# Patient Record
Sex: Male | Born: 1960 | Race: White | Hispanic: No | Smoking: Never smoker
Health system: Southern US, Community
[De-identification: ages and names within clinical notes are randomized; demographics above are authoritative.]

## PROBLEM LIST (undated history)

## (undated) DIAGNOSIS — E039 Hypothyroidism, unspecified: Secondary | ICD-10-CM

## (undated) DIAGNOSIS — E291 Testicular hypofunction: Secondary | ICD-10-CM

## (undated) DIAGNOSIS — M67863 Other specified disorders of tendon, right knee: Secondary | ICD-10-CM

## (undated) DIAGNOSIS — Z8639 Personal history of other endocrine, nutritional and metabolic disease: Secondary | ICD-10-CM

## (undated) DIAGNOSIS — E669 Obesity, unspecified: Secondary | ICD-10-CM

## (undated) DIAGNOSIS — Z9189 Other specified personal risk factors, not elsewhere classified: Secondary | ICD-10-CM

## (undated) DIAGNOSIS — I480 Paroxysmal atrial fibrillation: Secondary | ICD-10-CM

## (undated) DIAGNOSIS — R0602 Shortness of breath: Secondary | ICD-10-CM

## (undated) DIAGNOSIS — Z973 Presence of spectacles and contact lenses: Secondary | ICD-10-CM

## (undated) DIAGNOSIS — E785 Hyperlipidemia, unspecified: Secondary | ICD-10-CM

## (undated) DIAGNOSIS — M549 Dorsalgia, unspecified: Secondary | ICD-10-CM

## (undated) DIAGNOSIS — G4733 Obstructive sleep apnea (adult) (pediatric): Secondary | ICD-10-CM

## (undated) DIAGNOSIS — I10 Essential (primary) hypertension: Secondary | ICD-10-CM

## (undated) DIAGNOSIS — E559 Vitamin D deficiency, unspecified: Secondary | ICD-10-CM

## (undated) HISTORY — DX: Paroxysmal atrial fibrillation: I48.0

## (undated) HISTORY — DX: Vitamin D deficiency, unspecified: E55.9

## (undated) HISTORY — PX: EYE SURGERY: SHX253

## (undated) HISTORY — DX: Shortness of breath: R06.02

## (undated) HISTORY — PX: HYPOSPADIAS CORRECTION: SHX483

## (undated) HISTORY — DX: Dorsalgia, unspecified: M54.9

## (undated) HISTORY — DX: Obstructive sleep apnea (adult) (pediatric): G47.33

## (undated) HISTORY — DX: Hyperlipidemia, unspecified: E78.5

## (undated) HISTORY — DX: Essential (primary) hypertension: I10

## (undated) HISTORY — DX: Obesity, unspecified: E66.9

---

## 2000-04-08 ENCOUNTER — Emergency Department (HOSPITAL_COMMUNITY): Admission: EM | Admit: 2000-04-08 | Discharge: 2000-04-08 | Payer: Self-pay | Admitting: Emergency Medicine

## 2000-04-10 ENCOUNTER — Emergency Department (HOSPITAL_COMMUNITY): Admission: EM | Admit: 2000-04-10 | Discharge: 2000-04-10 | Payer: Self-pay | Admitting: Emergency Medicine

## 2000-04-24 ENCOUNTER — Emergency Department (HOSPITAL_COMMUNITY): Admission: EM | Admit: 2000-04-24 | Discharge: 2000-04-24 | Payer: Self-pay | Admitting: Internal Medicine

## 2004-08-04 ENCOUNTER — Ambulatory Visit: Payer: Self-pay | Admitting: Internal Medicine

## 2004-09-12 HISTORY — PX: COLONOSCOPY: SHX174

## 2004-09-15 ENCOUNTER — Ambulatory Visit: Payer: Self-pay | Admitting: Internal Medicine

## 2004-10-04 ENCOUNTER — Ambulatory Visit: Payer: Self-pay | Admitting: Internal Medicine

## 2004-10-18 ENCOUNTER — Ambulatory Visit: Payer: Self-pay | Admitting: Endocrinology

## 2004-10-22 ENCOUNTER — Ambulatory Visit (HOSPITAL_COMMUNITY): Admission: RE | Admit: 2004-10-22 | Discharge: 2004-10-22 | Payer: Self-pay | Admitting: Endocrinology

## 2004-11-30 ENCOUNTER — Encounter: Admission: RE | Admit: 2004-11-30 | Discharge: 2004-11-30 | Payer: Self-pay | Admitting: Endocrinology

## 2004-11-30 ENCOUNTER — Encounter (INDEPENDENT_AMBULATORY_CARE_PROVIDER_SITE_OTHER): Payer: Self-pay | Admitting: *Deleted

## 2004-11-30 ENCOUNTER — Other Ambulatory Visit: Admission: RE | Admit: 2004-11-30 | Discharge: 2004-11-30 | Payer: Self-pay | Admitting: Interventional Radiology

## 2004-12-13 ENCOUNTER — Ambulatory Visit: Payer: Self-pay | Admitting: Endocrinology

## 2005-01-07 ENCOUNTER — Ambulatory Visit: Payer: Self-pay | Admitting: Endocrinology

## 2005-02-02 ENCOUNTER — Ambulatory Visit (HOSPITAL_COMMUNITY): Admission: RE | Admit: 2005-02-02 | Discharge: 2005-02-03 | Payer: Self-pay | Admitting: Otolaryngology

## 2005-02-02 ENCOUNTER — Encounter (INDEPENDENT_AMBULATORY_CARE_PROVIDER_SITE_OTHER): Payer: Self-pay | Admitting: Specialist

## 2005-02-03 HISTORY — PX: THYROID LOBECTOMY: SHX420

## 2005-04-28 ENCOUNTER — Ambulatory Visit: Payer: Self-pay | Admitting: Internal Medicine

## 2005-05-05 ENCOUNTER — Ambulatory Visit: Payer: Self-pay | Admitting: Internal Medicine

## 2005-06-06 ENCOUNTER — Ambulatory Visit: Payer: Self-pay | Admitting: Internal Medicine

## 2005-06-13 ENCOUNTER — Ambulatory Visit: Payer: Self-pay | Admitting: Internal Medicine

## 2005-06-20 ENCOUNTER — Ambulatory Visit: Payer: Self-pay | Admitting: Internal Medicine

## 2005-08-10 ENCOUNTER — Ambulatory Visit: Payer: Self-pay | Admitting: Internal Medicine

## 2005-10-03 ENCOUNTER — Ambulatory Visit: Payer: Self-pay | Admitting: Internal Medicine

## 2006-02-07 ENCOUNTER — Ambulatory Visit: Payer: Self-pay | Admitting: Internal Medicine

## 2006-05-08 ENCOUNTER — Ambulatory Visit: Payer: Self-pay | Admitting: Internal Medicine

## 2006-11-06 ENCOUNTER — Ambulatory Visit: Payer: Self-pay | Admitting: Internal Medicine

## 2006-11-06 LAB — CONVERTED CEMR LAB
BUN: 12 mg/dL (ref 6–23)
CO2: 31 meq/L (ref 19–32)
Calcium: 9.4 mg/dL (ref 8.4–10.5)
Creatinine, Ser: 1.1 mg/dL (ref 0.4–1.5)
GFR calc Af Amer: 93 mL/min
Glucose, Bld: 99 mg/dL (ref 70–99)
Potassium: 4 meq/L (ref 3.5–5.1)

## 2007-05-29 ENCOUNTER — Ambulatory Visit: Payer: Self-pay | Admitting: Internal Medicine

## 2007-06-11 ENCOUNTER — Ambulatory Visit: Payer: Self-pay | Admitting: Internal Medicine

## 2007-06-11 LAB — CONVERTED CEMR LAB
ALT: 29 units/L (ref 0–53)
AST: 19 units/L (ref 0–37)
Albumin: 4 g/dL (ref 3.5–5.2)
Alkaline Phosphatase: 38 units/L — ABNORMAL LOW (ref 39–117)
BUN: 15 mg/dL (ref 6–23)
Basophils Absolute: 0.1 10*3/uL (ref 0.0–0.1)
Basophils Relative: 1 % (ref 0.0–1.0)
Bilirubin Urine: NEGATIVE
Bilirubin, Direct: 0.1 mg/dL (ref 0.0–0.3)
CO2: 29 meq/L (ref 19–32)
Calcium: 9.2 mg/dL (ref 8.4–10.5)
Chloride: 104 meq/L (ref 96–112)
Cholesterol: 156 mg/dL (ref 0–200)
Creatinine, Ser: 1.1 mg/dL (ref 0.4–1.5)
Crystals: NEGATIVE
Eosinophils Absolute: 0.2 10*3/uL (ref 0.0–0.6)
Eosinophils Relative: 4 % (ref 0.0–5.0)
GFR calc Af Amer: 92 mL/min
GFR calc non Af Amer: 76 mL/min
Glucose, Bld: 112 mg/dL — ABNORMAL HIGH (ref 70–99)
HCT: 43.8 % (ref 39.0–52.0)
HDL: 30.2 mg/dL — ABNORMAL LOW (ref >39.0)
Hemoglobin, Urine: NEGATIVE
Hemoglobin: 15 g/dL (ref 13.0–17.0)
Ketones, ur: NEGATIVE mg/dL
LDL Cholesterol: 107 mg/dL — ABNORMAL HIGH (ref 0–99)
Leukocytes, UA: NEGATIVE
Lymphocytes Relative: 26.9 % (ref 12.0–46.0)
MCHC: 34.2 g/dL (ref 30.0–36.0)
MCV: 90.2 fL (ref 78.0–100.0)
Monocytes Absolute: 0.5 10*3/uL (ref 0.2–0.7)
Monocytes Relative: 9 % (ref 3.0–11.0)
Mucus, UA: NEGATIVE
Neutro Abs: 3.7 10*3/uL (ref 1.4–7.7)
Neutrophils Relative %: 59.1 % (ref 43.0–77.0)
Nitrite: NEGATIVE
PSA: 0.45 ng/mL (ref 0.10–4.00)
Platelets: 258 10*3/uL (ref 150–400)
Potassium: 4.3 meq/L (ref 3.5–5.1)
RBC / HPF: NONE SEEN
RBC: 4.86 M/uL (ref 4.22–5.81)
RDW: 12.1 % (ref 11.5–14.6)
Sodium: 140 meq/L (ref 135–145)
Specific Gravity, Urine: 1.01 (ref 1.000–1.03)
TSH: 3.96 microintl units/mL (ref 0.35–5.50)
Total Bilirubin: 0.7 mg/dL (ref 0.3–1.2)
Total CHOL/HDL Ratio: 5.2
Total Protein, Urine: NEGATIVE mg/dL
Total Protein: 6.7 g/dL (ref 6.0–8.3)
Triglycerides: 92 mg/dL (ref 0–149)
Urine Glucose: NEGATIVE mg/dL
Urobilinogen, UA: 0.2 (ref 0.0–1.0)
VLDL: 18 mg/dL (ref 0–40)
WBC: 6.1 10*3/uL (ref 4.5–10.5)
pH: 6 (ref 5.0–8.0)

## 2007-06-22 ENCOUNTER — Ambulatory Visit: Payer: Self-pay | Admitting: Internal Medicine

## 2007-06-24 ENCOUNTER — Encounter: Payer: Self-pay | Admitting: Internal Medicine

## 2007-06-24 DIAGNOSIS — E785 Hyperlipidemia, unspecified: Secondary | ICD-10-CM | POA: Insufficient documentation

## 2007-06-24 DIAGNOSIS — I10 Essential (primary) hypertension: Secondary | ICD-10-CM

## 2007-06-24 DIAGNOSIS — E039 Hypothyroidism, unspecified: Secondary | ICD-10-CM

## 2007-10-10 ENCOUNTER — Encounter (INDEPENDENT_AMBULATORY_CARE_PROVIDER_SITE_OTHER): Payer: Self-pay | Admitting: *Deleted

## 2007-10-24 ENCOUNTER — Encounter (INDEPENDENT_AMBULATORY_CARE_PROVIDER_SITE_OTHER): Payer: Self-pay | Admitting: *Deleted

## 2007-12-24 ENCOUNTER — Telehealth: Payer: Self-pay | Admitting: Internal Medicine

## 2008-01-21 ENCOUNTER — Ambulatory Visit: Payer: Self-pay | Admitting: Internal Medicine

## 2008-01-22 LAB — CONVERTED CEMR LAB
BUN: 16 mg/dL (ref 6–23)
CO2: 31 meq/L (ref 19–32)
Chloride: 105 meq/L (ref 96–112)
GFR calc non Af Amer: 69 mL/min
Potassium: 3.7 meq/L (ref 3.5–5.1)
TSH: 2.91 microintl units/mL (ref 0.35–5.50)

## 2009-02-05 ENCOUNTER — Encounter: Payer: Self-pay | Admitting: Internal Medicine

## 2009-04-27 ENCOUNTER — Ambulatory Visit: Payer: Self-pay | Admitting: Internal Medicine

## 2009-04-28 ENCOUNTER — Ambulatory Visit: Payer: Self-pay | Admitting: Internal Medicine

## 2009-04-29 DIAGNOSIS — Z860100 Personal history of colon polyps, unspecified: Secondary | ICD-10-CM | POA: Insufficient documentation

## 2009-05-04 LAB — CONVERTED CEMR LAB
Albumin: 4 g/dL (ref 3.5–5.2)
CO2: 31 meq/L (ref 19–32)
Chloride: 105 meq/L (ref 96–112)
Cholesterol: 146 mg/dL (ref 0–200)
LDL Cholesterol: 90 mg/dL (ref 0–99)
Potassium: 3.8 meq/L (ref 3.5–5.1)
TSH: 4.12 microintl units/mL (ref 0.35–5.50)
Testosterone: 196.69 ng/dL — ABNORMAL LOW (ref 350.00–890.00)
Total Protein: 6.8 g/dL (ref 6.0–8.3)
Triglycerides: 126 mg/dL (ref 0.0–149.0)
VLDL: 25.2 mg/dL (ref 0.0–40.0)

## 2009-07-29 ENCOUNTER — Encounter (INDEPENDENT_AMBULATORY_CARE_PROVIDER_SITE_OTHER): Payer: Self-pay | Admitting: *Deleted

## 2009-07-31 ENCOUNTER — Telehealth: Payer: Self-pay | Admitting: Internal Medicine

## 2009-08-24 ENCOUNTER — Ambulatory Visit: Payer: Self-pay | Admitting: Internal Medicine

## 2009-08-24 DIAGNOSIS — E291 Testicular hypofunction: Secondary | ICD-10-CM

## 2009-08-24 DIAGNOSIS — N529 Male erectile dysfunction, unspecified: Secondary | ICD-10-CM

## 2009-09-10 ENCOUNTER — Ambulatory Visit: Payer: Self-pay | Admitting: Internal Medicine

## 2009-09-14 LAB — CONVERTED CEMR LAB
ALT: 42 units/L (ref 0–53)
AST: 26 units/L (ref 0–37)
Calcium: 9.2 mg/dL (ref 8.4–10.5)
Cholesterol: 153 mg/dL (ref 0–200)
GFR calc non Af Amer: 75.44 mL/min (ref >60)
Glucose, Bld: 106 mg/dL — ABNORMAL HIGH (ref 70–99)
HDL: 31.1 mg/dL — ABNORMAL LOW (ref >39.00)
Sodium: 140 meq/L (ref 135–145)
TSH: 4.46 microintl units/mL (ref 0.35–5.50)
Total Bilirubin: 0.8 mg/dL (ref 0.3–1.2)
Triglycerides: 125 mg/dL (ref 0.0–149.0)

## 2009-09-21 ENCOUNTER — Encounter (INDEPENDENT_AMBULATORY_CARE_PROVIDER_SITE_OTHER): Payer: Self-pay | Admitting: *Deleted

## 2009-10-05 ENCOUNTER — Telehealth: Payer: Self-pay | Admitting: Internal Medicine

## 2009-11-02 ENCOUNTER — Encounter: Payer: Self-pay | Admitting: Internal Medicine

## 2009-11-09 ENCOUNTER — Ambulatory Visit: Payer: Self-pay | Admitting: Internal Medicine

## 2009-11-16 ENCOUNTER — Telehealth: Payer: Self-pay | Admitting: Internal Medicine

## 2009-11-20 ENCOUNTER — Telehealth: Payer: Self-pay | Admitting: Internal Medicine

## 2009-11-23 ENCOUNTER — Telehealth: Payer: Self-pay | Admitting: Internal Medicine

## 2009-11-23 ENCOUNTER — Encounter: Payer: Self-pay | Admitting: Internal Medicine

## 2010-01-06 ENCOUNTER — Telehealth: Payer: Self-pay | Admitting: Internal Medicine

## 2010-10-12 NOTE — Medication Information (Signed)
Summary: Approved/[facility]  Approved/[facility]   Imported By: Lester Phelan 11/25/2009 09:39:34  _____________________________________________________________________  External Attachment:    Type:   Image     Comment:   External Document

## 2010-10-12 NOTE — Progress Notes (Signed)
Summary: medications  Phone Note Call from Patient Call back at Home Phone [phone]   Summary of Call: Patient left message on triage that he has been laid off and will be losing his health insurance. Patient would like to change to generics/alternatives of all of his meds. Please advise. Initial call taken by: Lucious Groves,  January 06, 2010 3:49 PM  Follow-up for Phone Call        Amlod/Benaz in place of Azor Does he want to switch to generic injectible testosterone? Other meds are generic Im sorry about lay off Follow-up by: Tresa Garter MD,  January 07, 2010 7:49 AM  Additional Follow-up for Phone Call Additional follow up Details #1::        left mess to call office back., need to suggest pt look into pt assistance for testosterone (either androgel or injectable) Additional Follow-up by: Lamar Sprinkles, CMA,  January 07, 2010 3:51 PM    Additional Follow-up for Phone Call Additional follow up Details #2::    pt informed via VM that BP meds changed to generic and sent to pharmacy, other meds generic. I suggested pt look into pt asst for injectable medication through manufacture. Follow-up by: Margaret Pyle, CMA,  January 08, 2010 3:45 PM  New/Updated Medications: AMLODIPINE BESY-BENAZEPRIL HCL 5-20 MG CAPS (AMLODIPINE BESY-BENAZEPRIL HCL) 1 by mouth once daily for blood pressure Prescriptions: AMLODIPINE BESY-BENAZEPRIL HCL 5-20 MG CAPS (AMLODIPINE BESY-BENAZEPRIL HCL) 1 by mouth once daily for blood pressure  #90 x 3   Entered by:   Lamar Sprinkles, CMA   Authorized by:   Tresa Garter MD   Signed by:   Lamar Sprinkles, CMA on 01/07/2010   Method used:   [facility]        [facility] [phone][facility] (retail)       [address]       [place], [place]  [address]       Ph: [phone]       Fax: [phone]   RxID:   [id number]

## 2010-10-12 NOTE — Progress Notes (Signed)
  Phone Note Refill Request Message from:  Fax from Pharmacy  Refills Requested: Medication #1:  ANDROGEL PUMP 1 % GEL 10 g on skin qam. Initial call taken by: Ami Bullins CMA,  November 20, 2009 9:31 AM  Follow-up for Phone Call        prescription printed out and given to Dr Jonny Ruiz to sign in absence of Dr Macario Golds. Dr Jonny Ruiz signed and prescription was put up front in metal cabinet for pt to pick up. Follow-up by: Ami Bullins CMA,  November 20, 2009 9:39 AM    Prescriptions: ANDROGEL PUMP 1 % GEL (TESTOSTERONE) 10 g on skin qam  #1 x 6   Entered by:   Ami Bullins CMA   Authorized by:   Tresa Garter MD   Signed by:   Bill Salinas CMA on 11/20/2009   Method used:   Print then Give to Patient   RxID:   [id number]

## 2010-10-12 NOTE — Assessment & Plan Note (Signed)
Summary: 4 MO ROV / #  Randy [name]   Vital Signs:  Patient profile:   50 year old male Weight:      342 pounds Temp:     98.7 degrees F oral Pulse rate:   81 / minute BP sitting:   132 / 86  (left arm)  Vitals Entered By: Tora Perches (November 09, 2009 1:50 PM) CC: f/u Is Patient Diabetic? No   CC:  f/u.  History of Present Illness: The patient presents for a follow up of hypertension, hypothyroidism, hyperlipidemia   Preventive Screening-Counseling & Management  Alcohol-Tobacco     Smoking Status: never  Current Medications (verified): 1)  Lovastatin 40 Mg Tabs (Lovastatin) .... Qd 2)  Levoxyl 200 Mcg  Tabs (Levothyroxine Sodium) .... Qd 3)  Aspir-Low 81 Mg Tbec (Aspirin) .Marland Kitchen.. 1 Once Daily Pc 4)  Vitamin D3 1000 Unit  Tabs (Cholecalciferol) .Marland Kitchen.. 1 By Mouth Daily 5)  Azor 5-20 Mg  Tabs (Amlodipine-Olmesartan) .... Take 1 Tab By Mouth Every Day 6)  Triamterene-Hctz 37.5-25 Mg  Tabs (Triamterene-Hctz) .... One By Mouth Daily  Allergies (verified): No Known Drug Allergies  Past History:  Past Medical History: Last updated: 04/27/2009 Hyperlipidemia Hypertension Hypothyroidism Obesity Colonic polyps, hx of 2006 Dr Leone Payor  Review of Systems  The patient denies weight loss, weight gain, chest pain, dyspnea on exertion, and abdominal pain.    Physical Exam  General:  overweight-appearing.   Nose:  External nasal examination shows no deformity or inflammation. Nasal mucosa are pink and moist without lesions or exudates. Mouth:  Oral mucosa and oropharynx without lesions or exudates.  Teeth in good repair. Lungs:  Normal respiratory effort, chest expands symmetrically. Lungs are clear to auscultation, no crackles or wheezes. Heart:  Normal rate and regular rhythm. S1 and S2 normal without gallop, murmur, click, rub or other extra sounds. Abdomen:  Bowel sounds positive,abdomen soft and non-tender without masses, organomegaly or hernias noted. Msk:  No deformity or  scoliosis noted of thoracic or lumbar spine.   Neurologic:  No cranial nerve deficits noted. Station and gait are normal. Plantar reflexes are down-going bilaterally. DTRs are symmetrical throughout. Sensory, motor and coordinative functions appear intact. Skin:  Intact without suspicious lesions or rashes Psych:  Cognition and judgment appear intact. Alert and cooperative with normal attention span and concentration. No apparent delusions, illusions, hallucinations   Impression & Recommendations:  Problem # 1:  HYPOTHYROIDISM (ICD-244.9) Assessment Comment Only  The following medications were removed from the medication list:    Levoxyl 200 Mcg Tabs (Levothyroxine sodium) ..... Qd His updated medication list for this problem includes:    Levoxyl 112 Mcg Tabs (Levothyroxine sodium) .Marland Kitchen... 2 by mouth qd  Labs Reviewed: TSH: 4.46 (09/10/2009)    HgBA1c: 5.6 (09/10/2009) Chol: 153 (09/10/2009)   HDL: 31.10 (09/10/2009)   LDL: 97 (09/10/2009)   TG: 125.0 (09/10/2009)  Problem # 2:  OBESITY, MORBID (ICD-278.01) Assessment: Unchanged Discussed options again including lap band  Problem # 3:  ERECTILE DYSFUNCTION, ORGANIC (ICD-607.84) Assessment: Unchanged  Problem # 4:  HYPERLIPIDEMIA (ICD-272.4) Assessment: Comment Only  His updated medication list for this problem includes:    Lovastatin 40 Mg Tabs (Lovastatin) ..... Qd  Labs Reviewed: SGOT: 26 (09/10/2009)   SGPT: 42 (09/10/2009)   HDL:31.10 (09/10/2009), 31.00 (04/28/2009)  LDL:97 (09/10/2009), 90 (04/28/2009)  Chol:153 (09/10/2009), 146 (04/28/2009)  Trig:125.0 (09/10/2009), 126.0 (04/28/2009)  Problem # 5:  HYPOGONADISM (ICD-257.2) Assessment: New  Treatment options discussed - elected injections. Risks vs benefits and  controversies of a long term testosterone  use were discussed. IM inj demo given  Orders: Admin of Therapeutic Inj (IM or [place]) ([id number])  Complete Medication List: 1)  Lovastatin 40 Mg Tabs (Lovastatin) ....  Qd 2)  Aspir-low 81 Mg Tbec (Aspirin) .Marland Kitchen.. 1 once daily pc 3)  Vitamin D3 1000 Unit Tabs (Cholecalciferol) .Marland Kitchen.. 1 by mouth daily 4)  Azor 5-20 Mg Tabs (Amlodipine-olmesartan) .... Take 1 tab by mouth every day 5)  Triamterene-hctz 37.5-25 Mg Tabs (Triamterene-hctz) .... One by mouth daily 6)  Levoxyl 112 Mcg Tabs (Levothyroxine sodium) .... 2 by mouth qd 7)  Testosterone Cypionate 200 Mg/ml Oil (Testosterone cypionate) .... 2 ml im q 2 wks 8)  3ml Syr 1" 23g Needle  .... [place] as dirr im  Patient Instructions: 1)  Please schedule a follow-up appointment in 3 months. 2)  BMP prior to visit, ICD-9: 3)  Hepatic Panel prior to visit, ICD-9: 4)  Lipid Panel prior to visit, ICD-9: 5)  TSH prior to visit, ICD-9: 6)  CBC w/ Diff prior to visit, ICD-9: 7)  HbgA1C prior to visit, ICD-9: 995.20  272.20 Prescriptions: SYR 1" 23G NEEDLE [place] as dirr IM  #20 x 12   Entered and Authorized by:   Tresa Garter MD   Signed by:   Tresa Garter MD on 11/09/2009   Method used:   Print then Give to Patient   RxID:   [id number] SYRINGE 27G X 1-1/4" 3 ML MISC (SYRINGE/NEEDLE (DISP)) as dirr IM  #20 x 6   Entered and Authorized by:   Tresa Garter MD   Signed by:   Tresa Garter MD on 11/09/2009   Method used:   Print then Give to Patient   RxID:   [id number] TESTOSTERONE CYPIONATE 200 MG/ML OIL (TESTOSTERONE CYPIONATE) 2 ml IM q 2 wks  #20 ml x 6   Entered and Authorized by:   Tresa Garter MD   Signed by:   Tresa Garter MD on 11/09/2009   Method used:   Print then Give to Patient   RxID:   [id number] AZOR 5-20 MG  TABS (AMLODIPINE-OLMESARTAN) Take 1 tab by mouth every day  #30 x 12   Entered and Authorized by:   Tresa Garter MD   Signed by:   Tresa Garter MD on 11/09/2009   Method used:   [facility] to        [facility] [phone]* (retail)       [address]       [place], [place]  [address]        Ph: [phone]       Fax: [phone]   RxID:   [id number] LEVOXYL 112 MCG TABS (LEVOTHYROXINE SODIUM) 2 by mouth qd  #60 x 12   Entered and Authorized by:   Tresa Garter MD   Signed by:   Tresa Garter MD on 11/09/2009   Method used:   Electronically to        [facility] [phone]* (retail)       [address]       [place], [place]  [address]       Ph: [phone]       Fax: [phone]   RxID:   [id number]

## 2010-10-12 NOTE — Progress Notes (Signed)
Summary: REFILL  Phone Note Call from Patient Call back at [phone]   Summary of Call: Pt lost azor rx. Sent into pharm, need to call pt to inform.  Initial call taken by: Lamar Sprinkles, CMA,  October 05, 2009 9:49 AM  Follow-up for Phone Call        Pt informed  Follow-up by: Lamar Sprinkles, CMA,  October 05, 2009 10:34 AM    Prescriptions: Kaylyn Layer 5-20 MG  TABS (AMLODIPINE-OLMESARTAN) Take 1 tab by mouth every day  #90 x 3   Entered by:   Lamar Sprinkles, CMA   Authorized by:   Tresa Garter MD   Signed by:   Lamar Sprinkles, CMA on 10/05/2009   Method used:   Electronically to        [facility] [phone]* (retail)       [address]       [place], [place]  [address]       Ph: [phone]       Fax: [phone]   RxID:   [id number]

## 2010-10-12 NOTE — Letter (Signed)
Summary: [facility] Referral (unable to schedule) Notification  [facility]  [address] [place], [place] [address]   Phone: [phone]  Fax: [phone]      September 21, 2009 JEWELL RYANS 1960-10-21 MRN: [id number]   [facility] [address] [place], [place]  [address]   Dear Dr. Posey Rea:   Thank you for your kind referral of the above patient. We have attempted to schedule the recommended Colonoscopy but have been unable to schedule because:  _x_ The patient was not available by phone and/or has not returned our calls.  __ The patient declined to schedule the procedure at this time.  We appreciate the referral and hope that we will have the opportunity to treat this patient in the future.    Sincerely,   [facility]  Vania Rea. Jarold Motto M.D. Hedwig Morton. Juanda Chance M.D. Venita Lick. Russella Dar M.D. Wilhemina Bonito. Marina Goodell M.D. Barbette Hair. Arlyce Dice M.D. Iva Boop M.D. Cheron Every.D.

## 2010-10-12 NOTE — Progress Notes (Signed)
Summary: Androgel PA  Phone Note From Pharmacy   Caller: [facility] Summary of Call: PA request--Androgel. They will fax form. Initial call taken by: Lucious Groves,  November 23, 2009 10:20 AM  Follow-up for Phone Call        Form completed and faxed, does not need MD signature. Will wait for insurance company reply. Follow-up by: Lucious Groves,  November 23, 2009 10:34 AM     Appended Document: Androgel PA Approved until 2012.

## 2010-10-12 NOTE — Medication Information (Signed)
Summary: [facility]  [facility]   Imported By: Lester Montier 11/25/2009 07:54:25  _____________________________________________________________________  External Attachment:    Type:   Image     Comment:   External Document

## 2010-10-12 NOTE — Letter (Signed)
Summary: Health Screening/[facility]  Health Screening/[facility]   Imported By: Sherian Rein 11/06/2009 07:38:04  _____________________________________________________________________  External Attachment:    Type:   Image     Comment:   External Document

## 2010-10-12 NOTE — Progress Notes (Signed)
Summary: ANDROGEL  Phone Note Call from Patient   Summary of Call: Patient is requesting rx for androgel, insurance will cover per pt. He returned rx for testosterone injection & I shredded.  Initial call taken by: Lamar Sprinkles, CMA,  November 16, 2009 2:37 PM  Follow-up for Phone Call        OK Androgel Follow-up by: Tresa Garter MD,  November 16, 2009 6:08 PM  Additional Follow-up for Phone Call Additional follow up Details #1::        left mess to call office back............................Marland KitchenLamar Sprinkles, CMA  November 17, 2009 8:58 AM     Additional Follow-up for Phone Call Additional follow up Details #2::    attempted to call pt, no answer, no machine 2nd attempt, no answer, no machine................. Sherese Christopher  November 18, 2009 11:25 AM  Follow-up by: Sydell Axon,  November 17, 2009 3:56 PM  Additional Follow-up for Phone Call Additional follow up Details #3:: Details for Additional Follow-up Action Taken: No answer, no VM, rx was printed in Error yesterday and shredded....................................Marland KitchenLamar Sprinkles, CMA  November 18, 2009 5:36 PM   No answer/no machine. Closed phone note until patient calls again.Lucious Groves  November 19, 2009 1:24 PM   New/Updated Medications: ANDROGEL PUMP 1 % GEL (TESTOSTERONE) 10 g on skin qam Prescriptions: ANDROGEL PUMP 1 % GEL (TESTOSTERONE) 10 g on skin qam  #1 x 12   Entered and Authorized by:   Tresa Garter MD   Signed by:   Lamar Sprinkles, CMA on 11/17/2009   Method used:   Print then Give to Patient   RxID:   [id number]

## 2010-10-12 NOTE — Letter (Signed)
Summary: [facility]/Patient  [facility]/Patient   Imported By: Sherian Rein 11/04/2009 09:08:40  _____________________________________________________________________  External Attachment:    Type:   Image     Comment:   External Document

## 2010-12-05 ENCOUNTER — Other Ambulatory Visit: Payer: Self-pay | Admitting: Internal Medicine

## 2010-12-15 ENCOUNTER — Telehealth: Payer: Self-pay | Admitting: *Deleted

## 2010-12-15 NOTE — Telephone Encounter (Signed)
Patient requesting refills on:  lovastatin 40mg  1qd maxzide 37.5/25 1 qd Levothyroxine 2 tabs qd  Last OV 10/2009, Ok for refill?

## 2010-12-15 NOTE — Telephone Encounter (Signed)
OK to fill all prescription with additional refills x2 and sch OV Thank you!

## 2010-12-16 MED ORDER — LEVOTHYROXINE SODIUM 112 MCG PO TABS: 224.0000 ug | ORAL_TABLET | Freq: Every day | ORAL | Status: DC

## 2010-12-16 MED ORDER — TRIAMTERENE-HCTZ 37.5-25 MG PO TABS: 1.0000 | ORAL_TABLET | Freq: Every day | ORAL | Status: DC

## 2010-12-16 MED ORDER — LOVASTATIN 40 MG PO TABS: 40.0000 mg | ORAL_TABLET | Freq: Every day | ORAL | Status: DC

## 2010-12-16 NOTE — Telephone Encounter (Signed)
Rf's done, left vm for pt to check w/pharm

## 2011-01-11 ENCOUNTER — Other Ambulatory Visit: Payer: Self-pay | Admitting: Internal Medicine

## 2011-01-28 NOTE — Op Note (Signed)
NAME:  Randy [name], Randy [name]              ACCOUNT NO.:  [id number]   MEDICAL RECORD NO.:  [id number]          PATIENT TYPE:  OIB   LOCATION:  5727                         FACILITY:  [facility]   PHYSICIAN:  Jefry H. Pollyann Kennedy, MD     DATE OF BIRTH:  [birth year]   DATE OF PROCEDURE:  02/03/2005  DATE OF DISCHARGE:  02/03/2005                                 OPERATIVE REPORT   PREOPERATIVE DIAGNOSIS:  Right thyroid nodule.   POSTOPERATIVE DIAGNOSIS:  Right thyroid nodule.   PROCEDURE:  Right thyroid lobectomy.   SURGEON:  Jefry H. Pollyann Kennedy, MD.   ASSISTANT:  Kinnie Scales. Annalee Genta, M.D.   ANESTHESIA:  General endotracheal anesthesia was used.   COMPLICATIONS:  No complications.   BLOOD LOSS:  Minimal.   FINDINGS:  An approximately 1 to 1.5 cm mass within the right lobe of the  thyroid.  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   REFERRING PHYSICIAN:  Sean A. Everardo All, M.D.  and Georgina Quint. Plotnikov, M.D.   HISTORY:  This is a 51 year old gentleman who was found recently to have a  solitary nodule in the right thyroid lobe. 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 Risks, benefits,  alternatives, complications of procedure were explained to the patient who  seemed to understand and agreed to surgery.   PROCEDURE:  The patient was taken to the operating room, placed on the  operating table in supine position. Following induction of general  endotracheal anesthesia, a shoulder roll was placed beneath the patient's  shoulders and the neck was prepped and draped in standard fashion. A low  horizontal incision was outlined with a marking pen just above the clavicle.  Electrocautery was used to incise the skin and through the superficial  fascia as well as platysma muscle. Subplatysmal flap was developed  superiorly up to the thyroid notch and inferiorly to the clavicle. Midline  fascia was divided. The right lobe was identified and retracted  medially  while careful dissection was used to dissect the right lobe off of the  posterior aspect of the strap muscles.  Continued dissection staying very  close to the capsule of the gland was accomplished all the way around  dividing the middle thyroid vein and up into the superior pole vasculature.  All vessels were doubly ligated between clamps and divided. 4-0 silk ties  were used as well. The recurrent laryngeal nerve was identified as it passed  posterior to the midportion of the gland and entered into the cricoid. The  ligament of Allyson Sabal was divided and the very thin isthmus was divided using  electrocautery. The specimen sent for pathologic evaluation. The wound was  irrigated with saline and closed in layers using 4-0 chromic  on the midline fascia and platysma layer and running 5-0 nylon on the skin.  A #7-French round JP drain was left in the wound exiting through a left side  of the incision and secured in place with nylon suture. The patient was  awakened from anesthesia, extubated, transferred to recovery in stable  condition.      JHR/MEDQ  D:  02/03/2005  T:  02/03/2005  Job:  [id number]

## 2011-03-10 ENCOUNTER — Other Ambulatory Visit: Payer: Self-pay | Admitting: Internal Medicine

## 2011-06-22 ENCOUNTER — Other Ambulatory Visit: Payer: Self-pay | Admitting: Internal Medicine

## 2011-10-05 ENCOUNTER — Telehealth: Payer: Self-pay | Admitting: *Deleted

## 2011-10-05 NOTE — Telephone Encounter (Signed)
OK to fill this prescription x 3 mo each. Sch an OV Thank you!

## 2011-10-05 NOTE — Telephone Encounter (Signed)
Rf req for Lovastatin 40 mg 1 po ad. # 90.  AND Triam/HCTZ 37.5/25 1 po qd # 90. Last filled 12.18.12. Last OV 10/2009. No existing appt. Ok to Rf?

## 2011-10-06 MED ORDER — TRIAMTERENE-HCTZ 37.5-25 MG PO TABS: 1.0000 | ORAL_TABLET | Freq: Every day | ORAL | Status: DC

## 2011-10-06 MED ORDER — AMLODIPINE BESY-BENAZEPRIL HCL 5-20 MG PO CAPS: 1.0000 | ORAL_CAPSULE | Freq: Every day | ORAL | Status: DC

## 2011-10-06 NOTE — Telephone Encounter (Signed)
Rf sent. Will have schedulers contact pt to arrange OV.

## 2011-10-10 ENCOUNTER — Other Ambulatory Visit: Payer: Self-pay | Admitting: *Deleted

## 2011-10-10 MED ORDER — LOVASTATIN 40 MG PO TABS: 40.0000 mg | ORAL_TABLET | Freq: Every day | ORAL | Status: DC

## 2011-10-18 ENCOUNTER — Other Ambulatory Visit: Payer: Self-pay | Admitting: *Deleted

## 2011-10-18 MED ORDER — LEVOTHYROXINE SODIUM 112 MCG PO TABS: 224.0000 ug | ORAL_TABLET | Freq: Every day | ORAL | Status: DC

## 2011-12-01 ENCOUNTER — Other Ambulatory Visit: Payer: Self-pay | Admitting: Internal Medicine

## 2011-12-27 ENCOUNTER — Ambulatory Visit: Payer: Self-pay | Admitting: Internal Medicine

## 2012-01-25 ENCOUNTER — Other Ambulatory Visit: Payer: Self-pay | Admitting: Internal Medicine

## 2012-01-27 ENCOUNTER — Encounter: Payer: Self-pay | Admitting: Internal Medicine

## 2012-01-27 ENCOUNTER — Ambulatory Visit (INDEPENDENT_AMBULATORY_CARE_PROVIDER_SITE_OTHER): Payer: PRIVATE HEALTH INSURANCE | Admitting: Internal Medicine

## 2012-01-27 VITALS — BP 150/70 | HR 76 | Temp 98.6°F | Resp 16 | Wt 360.0 lb

## 2012-01-27 DIAGNOSIS — E039 Hypothyroidism, unspecified: Secondary | ICD-10-CM

## 2012-01-27 DIAGNOSIS — Z0000 Encounter for general adult medical examination without abnormal findings: Secondary | ICD-10-CM

## 2012-01-27 DIAGNOSIS — E291 Testicular hypofunction: Secondary | ICD-10-CM

## 2012-01-27 DIAGNOSIS — I10 Essential (primary) hypertension: Secondary | ICD-10-CM

## 2012-01-27 MED ORDER — LEVOTHYROXINE SODIUM 112 MCG PO TABS: 112.0000 ug | ORAL_TABLET | Freq: Every day | ORAL | Status: DC

## 2012-01-27 MED ORDER — AMLODIPINE BESY-BENAZEPRIL HCL 5-20 MG PO CAPS: 1.0000 | ORAL_CAPSULE | Freq: Every day | ORAL | Status: DC

## 2012-01-27 MED ORDER — TRIAMTERENE-HCTZ 37.5-25 MG PO TABS: 1.0000 | ORAL_TABLET | Freq: Every day | ORAL | Status: DC

## 2012-01-27 MED ORDER — LOVASTATIN 40 MG PO TABS: 40.0000 mg | ORAL_TABLET | Freq: Every day | ORAL | Status: DC

## 2012-01-27 NOTE — Assessment & Plan Note (Signed)
Continue with current prescription therapy as reflected on the Med list.  

## 2012-01-27 NOTE — Assessment & Plan Note (Addendum)
We can re-start Rx Labs

## 2012-01-27 NOTE — Patient Instructions (Signed)
[email or web address] 

## 2012-01-27 NOTE — Progress Notes (Signed)
  Subjective:    Patient ID: Randy [name], male    DOB: July 15, 1961, 51 y.o.   MRN: [id number]  HPI  The patient presents for a follow-up of  chronic hypertension, chronic hypothyroidism, hypogonadism, obesity  Wt Readings from Last 3 Encounters:  01/27/12 360 lb (163.295 kg)  11/09/09 342 lb (155.13 kg)  08/24/09 341 lb (154.677 kg)   BP Readings from Last 3 Encounters:  01/27/12 150/70  11/09/09 132/86  08/24/09 140/80       Review of Systems  Constitutional: Negative for appetite change, fatigue and unexpected weight change.  HENT: Negative for nosebleeds, congestion, sore throat, sneezing, trouble swallowing and neck pain.   Eyes: Negative for itching and visual disturbance.  Respiratory: Negative for cough.   Cardiovascular: Negative for chest pain, palpitations and leg swelling.  Gastrointestinal: Negative for nausea, diarrhea, blood in stool and abdominal distention.  Genitourinary: Negative for frequency and hematuria.  Musculoskeletal: Negative for back pain, joint swelling and gait problem.  Skin: Negative for rash.  Neurological: Negative for dizziness, tremors, speech difficulty and weakness.  Psychiatric/Behavioral: Negative for suicidal ideas, sleep disturbance, dysphoric mood and agitation. The patient is not nervous/anxious.        Objective:   Physical Exam  Constitutional: He is oriented to person, place, and time. He appears well-developed.       Obese   HENT:  Mouth/Throat: Oropharynx is clear and moist.  Eyes: Conjunctivae are normal. Pupils are equal, round, and reactive to light.  Neck: Normal range of motion. No JVD present. No thyromegaly present.  Cardiovascular: Normal rate, regular rhythm, normal heart sounds and intact distal pulses.  Exam reveals no gallop and no friction rub.   No murmur heard. Pulmonary/Chest: Effort normal and breath sounds normal. No respiratory distress. He has no wheezes. He has no rales. He exhibits no tenderness.    Abdominal: Soft. Bowel sounds are normal. He exhibits no distension and no mass. There is no tenderness. There is no rebound and no guarding.  Musculoskeletal: Normal range of motion. He exhibits no edema and no tenderness.  Lymphadenopathy:    He has no cervical adenopathy.  Neurological: He is alert and oriented to person, place, and time. He has normal reflexes. No cranial nerve deficit. He exhibits normal muscle tone. Coordination normal.  Skin: Skin is warm and dry. No rash noted.  Psychiatric: He has a normal mood and affect. His behavior is normal. Judgment and thought content normal.    Lab Results  Component Value Date   WBC 6.1 06/11/2007   HGB 15.0 06/11/2007   HCT 43.8 06/11/2007   PLT 258 06/11/2007   GLUCOSE 106* 09/10/2009   CHOL 153 09/10/2009   TRIG 125.0 09/10/2009   HDL 31.10* 09/10/2009   LDLCALC 97 09/10/2009   ALT 42 09/10/2009   AST 26 09/10/2009   NA 140 09/10/2009   K 3.7 09/10/2009   CL 102 09/10/2009   CREATININE 1.1 09/10/2009   BUN 17 09/10/2009   CO2 30 09/10/2009   TSH 4.46 09/10/2009   PSA 0.45 06/11/2007   HGBA1C 5.6 09/10/2009         Assessment & Plan:

## 2012-01-31 NOTE — Assessment & Plan Note (Signed)
Lap band was discussed

## 2012-01-31 NOTE — Assessment & Plan Note (Signed)
Continue with current prescription therapy as reflected on the Med list.  

## 2012-03-07 ENCOUNTER — Other Ambulatory Visit (INDEPENDENT_AMBULATORY_CARE_PROVIDER_SITE_OTHER): Payer: PRIVATE HEALTH INSURANCE

## 2012-03-07 DIAGNOSIS — I10 Essential (primary) hypertension: Secondary | ICD-10-CM

## 2012-03-07 DIAGNOSIS — E291 Testicular hypofunction: Secondary | ICD-10-CM

## 2012-03-07 LAB — CBC WITH DIFFERENTIAL/PLATELET
Basophils Relative: 1.1 % (ref 0.0–3.0)
Eosinophils Absolute: 0.2 10*3/uL (ref 0.0–0.7)
HCT: 45.1 % (ref 39.0–52.0)
Hemoglobin: 15.1 g/dL (ref 13.0–17.0)
Lymphs Abs: 1.8 10*3/uL (ref 0.7–4.0)
MCHC: 33.5 g/dL (ref 30.0–36.0)
MCV: 90.6 fl (ref 78.0–100.0)
Monocytes Absolute: 0.6 10*3/uL (ref 0.1–1.0)
Neutro Abs: 3.2 10*3/uL (ref 1.4–7.7)
RBC: 4.97 Mil/uL (ref 4.22–5.81)

## 2012-03-07 LAB — BASIC METABOLIC PANEL
CO2: 27 mEq/L (ref 19–32)
Chloride: 103 mEq/L (ref 96–112)
Creatinine, Ser: 1.1 mg/dL (ref 0.4–1.5)
Sodium: 137 mEq/L (ref 135–145)

## 2012-03-07 LAB — URINALYSIS
Bilirubin Urine: NEGATIVE
Nitrite: NEGATIVE
Total Protein, Urine: NEGATIVE
pH: 6.5 (ref 5.0–8.0)

## 2012-03-07 LAB — LIPID PANEL: Total CHOL/HDL Ratio: 5

## 2012-03-07 LAB — HEPATIC FUNCTION PANEL
AST: 16 U/L (ref 0–37)
Alkaline Phosphatase: 45 U/L (ref 39–117)
Total Bilirubin: 0.8 mg/dL (ref 0.3–1.2)

## 2012-03-07 LAB — TESTOSTERONE: Testosterone: 199.82 ng/dL — ABNORMAL LOW (ref 350.00–890.00)

## 2012-03-08 ENCOUNTER — Other Ambulatory Visit: Payer: Self-pay | Admitting: Internal Medicine

## 2012-03-08 NOTE — Telephone Encounter (Signed)
Pt informed. He states he has been taking l-thyroxine 112 mcg 2 po qam. So what does he need to do?

## 2012-03-08 NOTE — Telephone Encounter (Signed)
Left mess for patient to call back.  

## 2012-03-08 NOTE — Telephone Encounter (Signed)
Randy [name], please, inform patient that all labs are normal except for abn thyroid, elev glu and low testost Increase Levothyrox to 125 mcg/d, check TSH in 6 wks Keep ROV  Please, mail the labs to the patient.    Thx

## 2012-03-09 MED ORDER — LEVOTHYROXINE SODIUM 125 MCG PO TABS: 125.0000 ug | ORAL_TABLET | Freq: Every day | ORAL | Status: DC

## 2012-03-09 NOTE — Telephone Encounter (Signed)
Done. Left detailed mess informing pt.  

## 2012-03-09 NOTE — Addendum Note (Signed)
Addended by: Merrilyn Puma on: 03/09/2012 05:00 PM   Modules accepted: Orders

## 2012-05-11 ENCOUNTER — Ambulatory Visit (INDEPENDENT_AMBULATORY_CARE_PROVIDER_SITE_OTHER): Payer: Self-pay | Admitting: Internal Medicine

## 2012-05-11 ENCOUNTER — Encounter: Payer: Self-pay | Admitting: Internal Medicine

## 2012-05-11 DIAGNOSIS — I10 Essential (primary) hypertension: Secondary | ICD-10-CM

## 2012-05-11 DIAGNOSIS — E785 Hyperlipidemia, unspecified: Secondary | ICD-10-CM

## 2012-05-11 DIAGNOSIS — E291 Testicular hypofunction: Secondary | ICD-10-CM

## 2012-05-11 DIAGNOSIS — E039 Hypothyroidism, unspecified: Secondary | ICD-10-CM

## 2012-05-11 MED ORDER — LEVOTHYROXINE SODIUM 125 MCG PO TABS: 125.0000 ug | ORAL_TABLET | Freq: Every day | ORAL | Status: DC

## 2012-05-11 MED ORDER — LEVOTHYROXINE SODIUM 125 MCG PO TABS: 250.0000 ug | ORAL_TABLET | Freq: Every day | ORAL | Status: DC

## 2012-05-11 NOTE — Assessment & Plan Note (Signed)
Better Continue with current prescription therapy as reflected on the Med list.  

## 2012-05-11 NOTE — Assessment & Plan Note (Signed)
Continue with current prescription therapy as reflected on the Med list.  

## 2012-05-11 NOTE — Assessment & Plan Note (Signed)
Refractory Lap band was discussed  

## 2012-05-11 NOTE — Progress Notes (Signed)
Patient ID: Randy [name], male   DOB: January 01, 1961, 51 y.o.   MRN: [id number]  Subjective:    Patient ID: Randy [name], male    DOB: Oct 03, 1960, 51 y.o.   MRN: [id number]  HPI  The patient presents for a follow-up of  chronic hypertension, chronic hypothyroidism, hypogonadism, obesity I made a mistake thinking he was on 1 tab of 112 mcg Levothyroxine a day instead of 2 and "increased" it to 125 mcg a day  Wt Readings from Last 3 Encounters:  05/11/12 358 lb (162.388 kg)  01/27/12 360 lb (163.295 kg)  11/09/09 342 lb (155.13 kg)   BP Readings from Last 3 Encounters:  05/11/12 128/82  01/27/12 150/70  11/09/09 132/86       Review of Systems  Constitutional: Negative for appetite change, fatigue and unexpected weight change.  HENT: Negative for nosebleeds, congestion, sore throat, sneezing, trouble swallowing and neck pain.   Eyes: Negative for itching and visual disturbance.  Respiratory: Negative for cough.   Cardiovascular: Negative for chest pain, palpitations and leg swelling.  Gastrointestinal: Negative for nausea, diarrhea, blood in stool and abdominal distention.  Genitourinary: Negative for frequency and hematuria.  Musculoskeletal: Negative for back pain, joint swelling and gait problem.  Skin: Negative for rash.  Neurological: Negative for dizziness, tremors, speech difficulty and weakness.  Psychiatric/Behavioral: Negative for suicidal ideas, disturbed wake/sleep cycle, dysphoric mood and agitation. The patient is not nervous/anxious.        Objective:   Physical Exam  Constitutional: He is oriented to person, place, and time. He appears well-developed.       Obese   HENT:  Mouth/Throat: Oropharynx is clear and moist.  Eyes: Conjunctivae are normal. Pupils are equal, round, and reactive to light.  Neck: Normal range of motion. No JVD present. No thyromegaly present.  Cardiovascular: Normal rate, regular rhythm, normal heart sounds and intact distal pulses.  Exam  reveals no gallop and no friction rub.   No murmur heard. Pulmonary/Chest: Effort normal and breath sounds normal. No respiratory distress. He has no wheezes. He has no rales. He exhibits no tenderness.  Abdominal: Soft. Bowel sounds are normal. He exhibits no distension and no mass. There is no tenderness. There is no rebound and no guarding.  Musculoskeletal: Normal range of motion. He exhibits no edema and no tenderness.  Lymphadenopathy:    He has no cervical adenopathy.  Neurological: He is alert and oriented to person, place, and time. He has normal reflexes. No cranial nerve deficit. He exhibits normal muscle tone. Coordination normal.  Skin: Skin is warm and dry. No rash noted.  Psychiatric: He has a normal mood and affect. His behavior is normal. Judgment and thought content normal.    Lab Results  Component Value Date   WBC 5.9 03/07/2012   HGB 15.1 03/07/2012   HCT 45.1 03/07/2012   PLT 272.0 03/07/2012   GLUCOSE 112* 03/07/2012   CHOL 156 03/07/2012   TRIG 127.0 03/07/2012   HDL 28.90* 03/07/2012   LDLCALC 102* 03/07/2012   ALT 27 03/07/2012   AST 16 03/07/2012   NA 137 03/07/2012   K 3.8 03/07/2012   CL 103 03/07/2012   CREATININE 1.1 03/07/2012   BUN 13 03/07/2012   CO2 27 03/07/2012   TSH 6.51* 03/07/2012   PSA 0.45 06/11/2007   HGBA1C 5.6 09/10/2009         Assessment & Plan:

## 2012-05-11 NOTE — Assessment & Plan Note (Signed)
Options to treat were discussed 

## 2012-05-11 NOTE — Assessment & Plan Note (Signed)
I made a mistake thinking he was on 1 tab of 112 mcg Levothyroxine a day instead of 2 and "increased" it to 125 mcg a day 8/13 - start 125 mcg 2 a day TSH in 6 wks

## 2012-05-18 ENCOUNTER — Telehealth: Payer: Self-pay | Admitting: Internal Medicine

## 2012-05-18 MED ORDER — TESTOSTERONE 10 MG/ACT (2%) TD GEL: 2.0000 | TRANSDERMAL | Status: DC

## 2012-05-18 MED ORDER — LEVOTHYROXINE SODIUM 125 MCG PO TABS: 250.0000 ug | ORAL_TABLET | Freq: Every day | ORAL | Status: DC

## 2012-05-18 NOTE — Telephone Encounter (Signed)
Ok - he can pick up samples too - he will have to combine camples for a daily dose of 250 mcg Thx

## 2012-05-18 NOTE — Telephone Encounter (Signed)
OK We have no samples of Fortesta (Rx and a card). Let me know if he needs a 3 mo Rx.  I left Synthroid samples for him Thx

## 2012-05-18 NOTE — Telephone Encounter (Signed)
Pt would like to have a written RX that he can pick up instead of it being called in at first so that he can get set up with a discount program

## 2012-05-18 NOTE — Telephone Encounter (Signed)
Pt has decided to do the fortesta treatment, needs to discuss starting this with Dr. Posey Rea or Misty Stanley

## 2012-05-21 NOTE — Telephone Encounter (Signed)
Left mess for patient to call back. Samples upfront.

## 2012-05-25 NOTE — Telephone Encounter (Signed)
Left detailed mess informing pt of below.  

## 2012-06-18 ENCOUNTER — Ambulatory Visit (INDEPENDENT_AMBULATORY_CARE_PROVIDER_SITE_OTHER): Payer: Self-pay | Admitting: Internal Medicine

## 2012-06-18 ENCOUNTER — Telehealth: Payer: Self-pay | Admitting: Internal Medicine

## 2012-06-18 ENCOUNTER — Encounter: Payer: Self-pay | Admitting: Internal Medicine

## 2012-06-18 ENCOUNTER — Other Ambulatory Visit (INDEPENDENT_AMBULATORY_CARE_PROVIDER_SITE_OTHER): Payer: Self-pay

## 2012-06-18 VITALS — BP 138/88 | HR 80 | Temp 100.4°F | Resp 16 | Wt 360.0 lb

## 2012-06-18 DIAGNOSIS — R1011 Right upper quadrant pain: Secondary | ICD-10-CM

## 2012-06-18 DIAGNOSIS — E785 Hyperlipidemia, unspecified: Secondary | ICD-10-CM

## 2012-06-18 LAB — CBC WITH DIFFERENTIAL/PLATELET
Eosinophils Relative: 1.5 % (ref 0.0–5.0)
Monocytes Absolute: 1.1 10*3/uL — ABNORMAL HIGH (ref 0.1–1.0)
Monocytes Relative: 11 % (ref 3.0–12.0)
Neutrophils Relative %: 65.8 % (ref 43.0–77.0)
Platelets: 269 10*3/uL (ref 150.0–400.0)
WBC: 10.4 10*3/uL (ref 4.5–10.5)

## 2012-06-18 MED ORDER — TRAMADOL HCL 50 MG PO TABS: 50.0000 mg | ORAL_TABLET | Freq: Two times a day (BID) | ORAL | Status: DC | PRN

## 2012-06-18 MED ORDER — CIPROFLOXACIN HCL 500 MG PO TABS: 500.0000 mg | ORAL_TABLET | Freq: Two times a day (BID) | ORAL | Status: DC

## 2012-06-18 MED ORDER — PROMETHAZINE HCL 25 MG PO TABS: 25.0000 mg | ORAL_TABLET | Freq: Three times a day (TID) | ORAL | Status: DC | PRN

## 2012-06-18 NOTE — Telephone Encounter (Signed)
Caller: Kie/Patient; Patient Name: Randy [name]; PCP: Plotnikov, Alex (Adults only); Best Callback Phone Number: [phone]; Call regarding Lower Abdominal dull ache, onset 10-5. Last BM 10-7. Afebrile. Abdominal Pain protocol used, ED disposition due to localized pain made worse with movement.  Same day Appointment scheduled with Dr Posey Rea at 1615 on 10-7.  Pt verbalized understanding.

## 2012-06-18 NOTE — Progress Notes (Signed)
Subjective:    Patient ID: Randy [name], male    DOB: [birth year], 51 y.o.   MRN: [id number]  Abdominal Pain This is a new problem. The current episode started in the past 7 days. The onset quality is gradual. The problem occurs constantly. The problem has been unchanged. The pain is located in the LLQ, RLQ and RUQ. The pain is at a severity of 3/10. The pain is mild. The quality of the pain is dull. Pertinent negatives include no diarrhea, dysuria, frequency, hematuria or nausea. The pain is aggravated by certain positions. The pain is relieved by movement. The treatment provided no relief. There is no history of abdominal surgery or colon cancer.    The patient presents for a follow-up of  chronic hypertension, chronic hypothyroidism, hypogonadism, obesity     Wt Readings from Last 3 Encounters:  06/18/12 360 lb (163.295 kg)  05/11/12 358 lb (162.388 kg)  01/27/12 360 lb (163.295 kg)   BP Readings from Last 3 Encounters:  06/18/12 138/88  05/11/12 128/82  01/27/12 150/70       Review of Systems  Constitutional: Negative for appetite change, fatigue and unexpected weight change.  HENT: Negative for nosebleeds, congestion, sore throat, sneezing, trouble swallowing and neck pain.   Eyes: Negative for itching and visual disturbance.  Respiratory: Negative for cough.   Cardiovascular: Negative for chest pain, palpitations and leg swelling.  Gastrointestinal: Positive for abdominal pain. Negative for nausea, diarrhea, blood in stool and abdominal distention.  Genitourinary: Negative for dysuria, frequency and hematuria.  Musculoskeletal: Negative for back pain, joint swelling and gait problem.  Skin: Negative for rash.  Neurological: Negative for dizziness, tremors, speech difficulty and weakness.  Psychiatric/Behavioral: Negative for suicidal ideas, disturbed wake/sleep cycle, dysphoric mood and agitation. The patient is not nervous/anxious.        Objective:   Physical Exam    Constitutional: He is oriented to person, place, and time. He appears well-developed. No distress.       Obese   HENT:  Mouth/Throat: Oropharynx is clear and moist.  Eyes: Conjunctivae normal are normal. Pupils are equal, round, and reactive to light.  Neck: Normal range of motion. No JVD present. No thyromegaly present.  Cardiovascular: Normal rate, regular rhythm, normal heart sounds and intact distal pulses.  Exam reveals no gallop and no friction rub.   No murmur heard. Pulmonary/Chest: Effort normal and breath sounds normal. No respiratory distress. He has no wheezes. He has no rales. He exhibits no tenderness.  Abdominal: Soft. Bowel sounds are normal. He exhibits no distension and no mass. There is tenderness (RUQ is tender; no rebound). There is no rebound and no guarding.  Musculoskeletal: Normal range of motion. He exhibits no edema and no tenderness.  Lymphadenopathy:    He has no cervical adenopathy.  Neurological: He is alert and oriented to person, place, and time. He has normal reflexes. No cranial nerve deficit. He exhibits normal muscle tone. Coordination normal.  Skin: Skin is warm and dry. No rash noted.  Psychiatric: He has a normal mood and affect. His behavior is normal. Judgment and thought content normal.    Lab Results  Component Value Date   WBC 5.9 03/07/2012   HGB 15.1 03/07/2012   HCT 45.1 03/07/2012   PLT 272.0 03/07/2012   GLUCOSE 112* 03/07/2012   CHOL 156 03/07/2012   TRIG 127.0 03/07/2012   HDL 28.90* 03/07/2012   LDLCALC 102* 03/07/2012   ALT 27 03/07/2012   AST 16  03/07/2012   NA 137 03/07/2012   K 3.8 03/07/2012   CL 103 03/07/2012   CREATININE 1.1 03/07/2012   BUN 13 03/07/2012   CO2 27 03/07/2012   TSH 6.51* 03/07/2012   PSA 0.45 06/11/2007   HGBA1C 5.6 09/10/2009         Assessment & Plan:

## 2012-06-19 ENCOUNTER — Telehealth: Payer: Self-pay | Admitting: *Deleted

## 2012-06-19 DIAGNOSIS — R1011 Right upper quadrant pain: Secondary | ICD-10-CM | POA: Insufficient documentation

## 2012-06-19 LAB — HEPATIC FUNCTION PANEL
AST: 17 U/L (ref 0–37)
Albumin: 3.9 g/dL (ref 3.5–5.2)
Alkaline Phosphatase: 53 U/L (ref 39–117)

## 2012-06-19 LAB — BASIC METABOLIC PANEL
CO2: 29 mEq/L (ref 19–32)
GFR: 70.88 mL/min (ref >60.00)
Glucose, Bld: 94 mg/dL (ref 70–99)
Potassium: 3.6 mEq/L (ref 3.5–5.1)
Sodium: 135 mEq/L (ref 135–145)

## 2012-06-19 LAB — TSH: TSH: 0.59 u[IU]/mL (ref 0.35–5.50)

## 2012-06-19 LAB — LIPASE: Lipase: 23 U/L (ref 11.0–59.0)

## 2012-06-19 NOTE — Telephone Encounter (Signed)
Left detailed mess informing pt of below.  

## 2012-06-19 NOTE — Telephone Encounter (Signed)
Randy [name], please, inform patient that all labs are normal including the thyroid test - good news! Cont Cipro, get the [place] Thx

## 2012-06-19 NOTE — Patient Instructions (Signed)
Clear liquids To ER if worse

## 2012-06-19 NOTE — Assessment & Plan Note (Addendum)
Labs US abdomen Clear liquids To ER if worse Cipro, Tramadol, Promethazine

## 2012-06-19 NOTE — Telephone Encounter (Signed)
Pt called back stating he is feeling better this am. He states his symptoms aren't all resolved but his fullness in his abdomen is gone. Right side tenderness is significantly better. I advised him his labs are still pending. Do you think he can keep taking abx and see if he continues to improve?

## 2012-06-19 NOTE — Telephone Encounter (Signed)
Yes - pls cont w/an abx Cipro Thx

## 2012-06-19 NOTE — Telephone Encounter (Signed)
Called pt to see how he is feeling today. NO answer. Left mess for patient to call back.

## 2012-06-22 ENCOUNTER — Telehealth: Payer: Self-pay | Admitting: *Deleted

## 2012-06-22 NOTE — Telephone Encounter (Signed)
Noted. Pls finish Cipro - all 10 days Thx

## 2012-06-22 NOTE — Telephone Encounter (Signed)
Pt left vm requesting call back. I called him and he states he is 100% better. All his symptoms have resolved.  He states he is not going to have the abdominal u/s, unless his symptoms return. He is going to keep his f/u appt in 08/2012.

## 2012-06-22 NOTE — Telephone Encounter (Signed)
Pt informed

## 2012-08-17 ENCOUNTER — Ambulatory Visit: Payer: Self-pay | Admitting: Internal Medicine

## 2012-09-18 ENCOUNTER — Ambulatory Visit: Payer: Self-pay | Admitting: Internal Medicine

## 2012-10-23 ENCOUNTER — Ambulatory Visit: Payer: Self-pay | Admitting: Internal Medicine

## 2012-11-27 ENCOUNTER — Ambulatory Visit: Payer: Self-pay | Admitting: Internal Medicine

## 2013-01-01 ENCOUNTER — Ambulatory Visit (INDEPENDENT_AMBULATORY_CARE_PROVIDER_SITE_OTHER): Payer: Self-pay | Admitting: Internal Medicine

## 2013-01-01 ENCOUNTER — Encounter: Payer: Self-pay | Admitting: Internal Medicine

## 2013-01-01 VITALS — BP 140/74 | HR 80 | Temp 98.2°F | Resp 16 | Wt 362.0 lb

## 2013-01-01 DIAGNOSIS — I10 Essential (primary) hypertension: Secondary | ICD-10-CM

## 2013-01-01 DIAGNOSIS — M79671 Pain in right foot: Secondary | ICD-10-CM

## 2013-01-01 DIAGNOSIS — M79609 Pain in unspecified limb: Secondary | ICD-10-CM

## 2013-01-01 DIAGNOSIS — E291 Testicular hypofunction: Secondary | ICD-10-CM

## 2013-01-01 MED ORDER — IBUPROFEN 600 MG PO TABS: 600.0000 mg | ORAL_TABLET | Freq: Three times a day (TID) | ORAL | Status: DC | PRN

## 2013-01-01 MED ORDER — "SYRINGE/NEEDLE (DISP) 25G X 1"" 3 ML MISC": Status: DC

## 2013-01-01 MED ORDER — TESTOSTERONE CYPIONATE 200 MG/ML IM SOLN: 300.0000 mg | INTRAMUSCULAR | Status: DC

## 2013-01-01 NOTE — Assessment & Plan Note (Signed)
Options discussed Testost

## 2013-01-01 NOTE — Progress Notes (Signed)
   Subjective:    HPI  The patient presents for a follow-up of  chronic hypertension, chronic hypothyroidism, hypogonadism, obesity     Wt Readings from Last 3 Encounters:  01/01/13 362 lb (164.202 kg)  06/18/12 360 lb (163.295 kg)  05/11/12 358 lb (162.388 kg)   BP Readings from Last 3 Encounters:  01/01/13 140/74  06/18/12 138/88  05/11/12 128/82       Review of Systems  Constitutional: Negative for appetite change, fatigue and unexpected weight change.  HENT: Negative for nosebleeds, congestion, sore throat, sneezing, trouble swallowing and neck pain.   Eyes: Negative for itching and visual disturbance.  Respiratory: Negative for cough.   Cardiovascular: Negative for chest pain, palpitations and leg swelling.  Gastrointestinal: Negative for blood in stool and abdominal distention.  Musculoskeletal: Negative for back pain, joint swelling and gait problem.  Skin: Negative for rash.  Neurological: Negative for dizziness, tremors, speech difficulty and weakness.  Psychiatric/Behavioral: Negative for suicidal ideas, sleep disturbance, dysphoric mood and agitation. The patient is not nervous/anxious.        Objective:   Physical Exam  Constitutional: He is oriented to person, place, and time. He appears well-developed. No distress.  Obese   HENT:  Mouth/Throat: Oropharynx is clear and moist.  Eyes: Conjunctivae are normal. Pupils are equal, round, and reactive to light.  Neck: Normal range of motion. No JVD present. No thyromegaly present.  Cardiovascular: Normal rate, regular rhythm, normal heart sounds and intact distal pulses.  Exam reveals no gallop and no friction rub.   No murmur heard. Pulmonary/Chest: Effort normal and breath sounds normal. No respiratory distress. He has no wheezes. He has no rales. He exhibits no tenderness.  Abdominal: Soft. Bowel sounds are normal. He exhibits no distension and no mass. There is tenderness (RUQ is tender; no rebound). There  is no rebound and no guarding.  Musculoskeletal: Normal range of motion. He exhibits no edema and no tenderness.  Lymphadenopathy:    He has no cervical adenopathy.  Neurological: He is alert and oriented to person, place, and time. He has normal reflexes. No cranial nerve deficit. He exhibits normal muscle tone. Coordination normal.  Skin: Skin is warm and dry. No rash noted.  Psychiatric: He has a normal mood and affect. His behavior is normal. Judgment and thought content normal.    Lab Results  Component Value Date   WBC 10.4 06/18/2012   HGB 15.0 06/18/2012   HCT 45.0 06/18/2012   PLT 269.0 06/18/2012   GLUCOSE 94 06/18/2012   CHOL 156 03/07/2012   TRIG 127.0 03/07/2012   HDL 28.90* 03/07/2012   LDLCALC 102* 03/07/2012   ALT 29 06/18/2012   AST 17 06/18/2012   NA 135 06/18/2012   K 3.6 06/18/2012   CL 99 06/18/2012   CREATININE 1.2 06/18/2012   BUN 13 06/18/2012   CO2 29 06/18/2012   TSH 0.59 06/18/2012   PSA 0.45 06/11/2007   HGBA1C 5.6 09/10/2009         Assessment & Plan:

## 2013-01-01 NOTE — Assessment & Plan Note (Signed)
Continue with current prescription therapy as reflected on the Med list.  

## 2013-01-01 NOTE — Assessment & Plan Note (Signed)
Discussed.

## 2013-01-03 ENCOUNTER — Ambulatory Visit: Payer: Self-pay

## 2013-01-03 ENCOUNTER — Ambulatory Visit (INDEPENDENT_AMBULATORY_CARE_PROVIDER_SITE_OTHER): Payer: Self-pay | Admitting: *Deleted

## 2013-01-03 DIAGNOSIS — E291 Testicular hypofunction: Secondary | ICD-10-CM

## 2013-01-03 MED ORDER — TESTOSTERONE CYPIONATE 200 MG/ML IM SOLN
300.0000 mg | Freq: Once | INTRAMUSCULAR | Status: AC
  Administered 2013-01-03: 300 mg via INTRAMUSCULAR

## 2013-02-27 ENCOUNTER — Other Ambulatory Visit: Payer: Self-pay | Admitting: Internal Medicine

## 2013-05-31 ENCOUNTER — Other Ambulatory Visit: Payer: Self-pay | Admitting: Internal Medicine

## 2013-07-03 ENCOUNTER — Ambulatory Visit: Payer: Self-pay | Admitting: Internal Medicine

## 2013-07-30 ENCOUNTER — Ambulatory Visit: Payer: Self-pay | Admitting: Internal Medicine

## 2013-08-06 ENCOUNTER — Ambulatory Visit: Payer: Self-pay | Admitting: Internal Medicine

## 2013-08-06 ENCOUNTER — Ambulatory Visit (INDEPENDENT_AMBULATORY_CARE_PROVIDER_SITE_OTHER): Payer: Self-pay | Admitting: Internal Medicine

## 2013-08-06 ENCOUNTER — Encounter: Payer: Self-pay | Admitting: Internal Medicine

## 2013-08-06 VITALS — BP 130/82 | HR 72 | Temp 98.1°F | Resp 16 | Wt 330.0 lb

## 2013-08-06 DIAGNOSIS — E785 Hyperlipidemia, unspecified: Secondary | ICD-10-CM

## 2013-08-06 DIAGNOSIS — E291 Testicular hypofunction: Secondary | ICD-10-CM

## 2013-08-06 DIAGNOSIS — I10 Essential (primary) hypertension: Secondary | ICD-10-CM

## 2013-08-06 DIAGNOSIS — E039 Hypothyroidism, unspecified: Secondary | ICD-10-CM

## 2013-08-06 NOTE — Assessment & Plan Note (Signed)
Off Rx 

## 2013-08-06 NOTE — Assessment & Plan Note (Signed)
Continue with current prescription therapy as reflected on the Med list.  

## 2013-08-06 NOTE — Patient Instructions (Signed)

## 2013-08-06 NOTE — Assessment & Plan Note (Signed)
Labs

## 2013-08-06 NOTE — Progress Notes (Signed)
   Subjective:    HPI  The patient presents for a follow-up of  chronic hypertension, chronic hypothyroidism, hypogonadism, obesity     Wt Readings from Last 3 Encounters:  08/06/13 330 lb (149.687 kg)  01/01/13 362 lb (164.202 kg)  06/18/12 360 lb (163.295 kg)   BP Readings from Last 3 Encounters:  08/06/13 130/82  01/01/13 140/74  06/18/12 138/88       Review of Systems  Constitutional: Negative for appetite change, fatigue and unexpected weight change.  HENT: Negative for congestion, nosebleeds, sneezing, sore throat and trouble swallowing.   Eyes: Negative for itching and visual disturbance.  Respiratory: Negative for cough.   Cardiovascular: Negative for chest pain, palpitations and leg swelling.  Gastrointestinal: Negative for blood in stool and abdominal distention.  Musculoskeletal: Negative for back pain, gait problem, joint swelling and neck pain.  Skin: Negative for rash.  Neurological: Negative for dizziness, tremors, speech difficulty and weakness.  Psychiatric/Behavioral: Negative for suicidal ideas, sleep disturbance, dysphoric mood and agitation. The patient is not nervous/anxious.        Objective:   Physical Exam  Constitutional: He is oriented to person, place, and time. He appears well-developed. No distress.  Obese   HENT:  Mouth/Throat: Oropharynx is clear and moist.  Eyes: Conjunctivae are normal. Pupils are equal, round, and reactive to light.  Neck: Normal range of motion. No JVD present. No thyromegaly present.  Cardiovascular: Normal rate, regular rhythm, normal heart sounds and intact distal pulses.  Exam reveals no gallop and no friction rub.   No murmur heard. Pulmonary/Chest: Effort normal and breath sounds normal. No respiratory distress. He has no wheezes. He has no rales. He exhibits no tenderness.  Abdominal: Soft. Bowel sounds are normal. He exhibits no distension and no mass. There is tenderness (RUQ is tender; no rebound). There  is no rebound and no guarding.  Musculoskeletal: Normal range of motion. He exhibits no edema and no tenderness.  Lymphadenopathy:    He has no cervical adenopathy.  Neurological: He is alert and oriented to person, place, and time. He has normal reflexes. No cranial nerve deficit. He exhibits normal muscle tone. Coordination normal.  Skin: Skin is warm and dry. No rash noted.  Psychiatric: He has a normal mood and affect. His behavior is normal. Judgment and thought content normal.    Lab Results  Component Value Date   WBC 10.4 06/18/2012   HGB 15.0 06/18/2012   HCT 45.0 06/18/2012   PLT 269.0 06/18/2012   GLUCOSE 94 06/18/2012   CHOL 156 03/07/2012   TRIG 127.0 03/07/2012   HDL 28.90* 03/07/2012   LDLCALC 102* 03/07/2012   ALT 29 06/18/2012   AST 17 06/18/2012   NA 135 06/18/2012   K 3.6 06/18/2012   CL 99 06/18/2012   CREATININE 1.2 06/18/2012   BUN 13 06/18/2012   CO2 29 06/18/2012   TSH 0.59 06/18/2012   PSA 0.45 06/11/2007   HGBA1C 5.6 09/10/2009         Assessment & Plan:

## 2013-08-06 NOTE — Assessment & Plan Note (Signed)
11/14 Loosing wt on [employer]itness pal  Wt Readings from Last 3 Encounters:  08/06/13 330 lb (149.687 kg)  01/01/13 362 lb (164.202 kg)  06/18/12 360 lb (163.295 kg)

## 2013-08-06 NOTE — Progress Notes (Signed)
Pre visit review using our clinic review tool, if applicable. No additional management support is needed unless otherwise documented below in the visit note. 

## 2013-08-16 ENCOUNTER — Other Ambulatory Visit (INDEPENDENT_AMBULATORY_CARE_PROVIDER_SITE_OTHER): Payer: Self-pay

## 2013-08-16 DIAGNOSIS — E291 Testicular hypofunction: Secondary | ICD-10-CM

## 2013-08-16 DIAGNOSIS — I10 Essential (primary) hypertension: Secondary | ICD-10-CM

## 2013-08-16 DIAGNOSIS — E039 Hypothyroidism, unspecified: Secondary | ICD-10-CM

## 2013-08-16 DIAGNOSIS — E785 Hyperlipidemia, unspecified: Secondary | ICD-10-CM

## 2013-08-16 LAB — HEMOGLOBIN A1C: Hgb A1c MFr Bld: 5.4 % (ref 4.6–6.5)

## 2013-08-16 LAB — BASIC METABOLIC PANEL
BUN: 15 mg/dL (ref 6–23)
CO2: 29 mEq/L (ref 19–32)
Chloride: 102 mEq/L (ref 96–112)
Creatinine, Ser: 1.2 mg/dL (ref 0.4–1.5)
Potassium: 3.8 mEq/L (ref 3.5–5.1)
Sodium: 137 mEq/L (ref 135–145)

## 2013-08-16 LAB — CBC WITH DIFFERENTIAL/PLATELET
Basophils Relative: 0.6 % (ref 0.0–3.0)
Eosinophils Absolute: 0.2 10*3/uL (ref 0.0–0.7)
HCT: 45.1 % (ref 39.0–52.0)
Hemoglobin: 15.4 g/dL (ref 13.0–17.0)
Lymphs Abs: 1.4 10*3/uL (ref 0.7–4.0)
MCHC: 34.2 g/dL (ref 30.0–36.0)
MCV: 90.9 fl (ref 78.0–100.0)
Monocytes Absolute: 0.5 10*3/uL (ref 0.1–1.0)
Monocytes Relative: 9.9 % (ref 3.0–12.0)
Neutro Abs: 3.1 10*3/uL (ref 1.4–7.7)
Platelets: 249 10*3/uL (ref 150.0–400.0)
RBC: 4.97 Mil/uL (ref 4.22–5.81)

## 2013-08-16 LAB — LIPID PANEL
HDL: 28.6 mg/dL — ABNORMAL LOW (ref >39.00)
Total CHOL/HDL Ratio: 5
Triglycerides: 106 mg/dL (ref 0.0–149.0)
VLDL: 21.2 mg/dL (ref 0.0–40.0)

## 2013-08-16 LAB — URINALYSIS
Bilirubin Urine: NEGATIVE
Ketones, ur: NEGATIVE
Leukocytes, UA: NEGATIVE
Nitrite: NEGATIVE
Specific Gravity, Urine: 1.02 (ref 1.000–1.030)
Total Protein, Urine: NEGATIVE
pH: 6 (ref 5.0–8.0)

## 2013-08-16 LAB — HEPATIC FUNCTION PANEL
ALT: 26 U/L (ref 0–53)
Albumin: 4 g/dL (ref 3.5–5.2)
Bilirubin, Direct: 0.1 mg/dL (ref 0.0–0.3)
Total Protein: 6.9 g/dL (ref 6.0–8.3)

## 2013-12-23 ENCOUNTER — Other Ambulatory Visit: Payer: Self-pay | Admitting: Internal Medicine

## 2014-02-01 ENCOUNTER — Other Ambulatory Visit: Payer: Self-pay | Admitting: Internal Medicine

## 2014-02-13 ENCOUNTER — Ambulatory Visit: Payer: Self-pay | Admitting: Internal Medicine

## 2014-02-14 ENCOUNTER — Other Ambulatory Visit: Payer: Self-pay | Admitting: Internal Medicine

## 2014-03-27 ENCOUNTER — Ambulatory Visit: Payer: Self-pay | Admitting: Internal Medicine

## 2014-05-08 ENCOUNTER — Ambulatory Visit: Payer: Self-pay | Admitting: Internal Medicine

## 2014-06-16 ENCOUNTER — Ambulatory Visit: Payer: Self-pay | Admitting: Internal Medicine

## 2014-07-14 ENCOUNTER — Ambulatory Visit: Payer: Self-pay | Admitting: Internal Medicine

## 2014-08-10 ENCOUNTER — Other Ambulatory Visit: Payer: Self-pay | Admitting: Internal Medicine

## 2014-10-28 ENCOUNTER — Other Ambulatory Visit: Payer: Self-pay | Admitting: Internal Medicine

## 2014-11-08 ENCOUNTER — Other Ambulatory Visit: Payer: Self-pay | Admitting: Internal Medicine

## 2014-11-13 ENCOUNTER — Other Ambulatory Visit: Payer: Self-pay | Admitting: Internal Medicine

## 2014-11-27 ENCOUNTER — Other Ambulatory Visit: Payer: Self-pay | Admitting: Internal Medicine

## 2014-12-10 ENCOUNTER — Encounter: Payer: Self-pay | Admitting: Internal Medicine

## 2014-12-10 ENCOUNTER — Ambulatory Visit (INDEPENDENT_AMBULATORY_CARE_PROVIDER_SITE_OTHER): Payer: Self-pay | Admitting: Internal Medicine

## 2014-12-10 VITALS — BP 150/94 | HR 75 | Ht 73.0 in | Wt 351.0 lb

## 2014-12-10 DIAGNOSIS — I10 Essential (primary) hypertension: Secondary | ICD-10-CM

## 2014-12-10 DIAGNOSIS — E034 Atrophy of thyroid (acquired): Secondary | ICD-10-CM

## 2014-12-10 DIAGNOSIS — E038 Other specified hypothyroidism: Secondary | ICD-10-CM

## 2014-12-10 DIAGNOSIS — Z0000 Encounter for general adult medical examination without abnormal findings: Secondary | ICD-10-CM

## 2014-12-10 MED ORDER — VITAMIN D 1000 UNITS PO TABS: 1000.0000 [IU] | ORAL_TABLET | Freq: Every day | ORAL | Status: AC

## 2014-12-10 MED ORDER — AMLODIPINE BESY-BENAZEPRIL HCL 5-20 MG PO CAPS
1.0000 | ORAL_CAPSULE | Freq: Every day | ORAL | Status: DC
Start: 2014-12-10 — End: 2016-01-15

## 2014-12-10 MED ORDER — LOVASTATIN 40 MG PO TABS: ORAL_TABLET | ORAL | Status: DC

## 2014-12-10 MED ORDER — TRIAMTERENE-HCTZ 37.5-25 MG PO TABS
1.0000 | ORAL_TABLET | Freq: Every day | ORAL | Status: DC
Start: 2014-12-10 — End: 2016-05-02

## 2014-12-10 MED ORDER — LEVOTHYROXINE SODIUM 125 MCG PO TABS: ORAL_TABLET | ORAL | Status: DC

## 2014-12-10 NOTE — Patient Instructions (Addendum)

## 2014-12-10 NOTE — Assessment & Plan Note (Signed)
Restart meds.

## 2014-12-10 NOTE — Assessment & Plan Note (Addendum)
We discussed age appropriate health related issues, including available/recomended screening tests and vaccinations. We discussed a need for adhering to healthy diet and exercise. Labs/EKG were reviewed/ordered. All questions were answered.  Eye exam Cologuard info

## 2014-12-10 NOTE — Progress Notes (Signed)
Pre visit review using our clinic review tool, if applicable. No additional management support is needed unless otherwise documented below in the visit note. 

## 2014-12-10 NOTE — Assessment & Plan Note (Signed)
Levothroid 

## 2014-12-10 NOTE — Progress Notes (Signed)
   Subjective:    HPI  The patient is here for a wellness exam.  The patient presents for a follow-up of  chronic hypertension, chronic hypothyroidism, hypogonadism, obesity   Wt Readings from Last 3 Encounters:  12/10/14 351 lb (159.213 kg)  08/06/13 330 lb (149.687 kg)  01/01/13 362 lb (164.202 kg)   BP Readings from Last 3 Encounters:  12/10/14 150/94  08/06/13 130/82  01/01/13 140/74       Review of Systems  Constitutional: Negative for appetite change, fatigue and unexpected weight change.  HENT: Negative for congestion, nosebleeds, sneezing, sore throat and trouble swallowing.   Eyes: Negative for itching and visual disturbance.  Respiratory: Negative for cough.   Cardiovascular: Negative for chest pain, palpitations and leg swelling.  Gastrointestinal: Negative for blood in stool and abdominal distention.  Musculoskeletal: Negative for back pain, joint swelling, gait problem and neck pain.  Skin: Negative for rash.  Neurological: Negative for dizziness, tremors, speech difficulty and weakness.  Psychiatric/Behavioral: Negative for suicidal ideas, sleep disturbance, dysphoric mood and agitation. The patient is not nervous/anxious.        Objective:   Physical Exam  Constitutional: He is oriented to person, place, and time. He appears well-developed. No distress.  Obese   HENT:  Mouth/Throat: Oropharynx is clear and moist.  Eyes: Conjunctivae are normal. Pupils are equal, round, and reactive to light.  Neck: Normal range of motion. No JVD present. No thyromegaly present.  Cardiovascular: Normal rate, regular rhythm, normal heart sounds and intact distal pulses.  Exam reveals no gallop and no friction rub.   No murmur heard. Pulmonary/Chest: Effort normal and breath sounds normal. No respiratory distress. He has no wheezes. He has no rales. He exhibits no tenderness.  Abdominal: Soft. Bowel sounds are normal. He exhibits no distension and no mass. There is no  tenderness. There is no rebound and no guarding.  Genitourinary: Rectum normal and prostate normal. Guaiac negative stool.  Musculoskeletal: Normal range of motion. He exhibits no edema or tenderness.  Lymphadenopathy:    He has no cervical adenopathy.  Neurological: He is alert and oriented to person, place, and time. He has normal reflexes. No cranial nerve deficit. He exhibits normal muscle tone. Coordination normal.  Skin: Skin is warm and dry. No rash noted.  Psychiatric: He has a normal mood and affect. His behavior is normal. Judgment and thought content normal.  R thigh skin tag  Lab Results  Component Value Date   WBC 5.3 08/16/2013   HGB 15.4 08/16/2013   HCT 45.1 08/16/2013   PLT 249.0 08/16/2013   GLUCOSE 100* 08/16/2013   CHOL 143 08/16/2013   TRIG 106.0 08/16/2013   HDL 28.60* 08/16/2013   LDLCALC 93 08/16/2013   ALT 26 08/16/2013   AST 15 08/16/2013   NA 137 08/16/2013   K 3.8 08/16/2013   CL 102 08/16/2013   CREATININE 1.2 08/16/2013   BUN 15 08/16/2013   CO2 29 08/16/2013   TSH 1.01 08/16/2013   PSA 0.45 06/11/2007   HGBA1C 5.4 08/16/2013         Assessment & Plan:

## 2015-01-28 ENCOUNTER — Other Ambulatory Visit: Payer: Self-pay | Admitting: Internal Medicine

## 2015-02-02 ENCOUNTER — Other Ambulatory Visit: Payer: Self-pay | Admitting: *Deleted

## 2015-02-02 MED ORDER — LEVOTHYROXINE SODIUM 125 MCG PO TABS: ORAL_TABLET | ORAL | Status: DC

## 2015-02-05 ENCOUNTER — Telehealth: Payer: Self-pay | Admitting: *Deleted

## 2015-02-05 NOTE — Telephone Encounter (Signed)
I received a request from [facility] for PA on Levothyroxine 125 mcg and Triam/HCTZ 37.5/25 mg. I called number above and spoke to a rep who states no clinical PA is required for either med. The patient needs to use the [employer] mobile app or go to [email or web address] to utilize his discount card for his rxs. Pharmacy informed. I left a detailed mess informing pt of this.

## 2015-06-12 ENCOUNTER — Ambulatory Visit: Payer: Self-pay | Admitting: Internal Medicine

## 2015-07-03 ENCOUNTER — Ambulatory Visit: Payer: Self-pay | Admitting: Internal Medicine

## 2015-08-04 ENCOUNTER — Ambulatory Visit: Payer: Self-pay | Admitting: Internal Medicine

## 2016-01-15 ENCOUNTER — Other Ambulatory Visit: Payer: Self-pay | Admitting: Internal Medicine

## 2016-02-16 ENCOUNTER — Other Ambulatory Visit: Payer: Self-pay | Admitting: Internal Medicine

## 2016-02-17 ENCOUNTER — Other Ambulatory Visit: Payer: Self-pay | Admitting: Internal Medicine

## 2016-02-24 ENCOUNTER — Telehealth: Payer: Self-pay | Admitting: Internal Medicine

## 2016-02-24 NOTE — Telephone Encounter (Signed)
Left vm to schedule an appt for medication management

## 2016-03-17 ENCOUNTER — Telehealth: Payer: Self-pay | Admitting: Internal Medicine

## 2016-03-17 MED ORDER — LEVOTHYROXINE SODIUM 125 MCG PO TABS: 250.0000 ug | ORAL_TABLET | Freq: Every day | ORAL | Status: DC

## 2016-03-17 MED ORDER — AMLODIPINE BESY-BENAZEPRIL HCL 5-20 MG PO CAPS: 1.0000 | ORAL_CAPSULE | Freq: Every day | ORAL | Status: DC

## 2016-03-17 MED ORDER — LOVASTATIN 40 MG PO TABS: 40.0000 mg | ORAL_TABLET | Freq: Every day | ORAL | Status: DC

## 2016-03-17 MED ORDER — TRIAMTERENE-HCTZ 37.5-25 MG PO TABS: 1.0000 | ORAL_TABLET | Freq: Every day | ORAL | Status: DC

## 2016-03-17 NOTE — Telephone Encounter (Signed)
Done. See meds. Pt informed he must keep 05/02/16 OV with PCP to receive further refills per office refill policy.

## 2016-03-17 NOTE — Telephone Encounter (Signed)
Pt has an appt with Dr. Macario GoldsPlot on 05/02/16. He was wondering if we can [facility] all 4 [facility]s [facility] until he comes in for the appt. Please call him back once its done.

## 2016-03-24 ENCOUNTER — Other Ambulatory Visit: Payer: Self-pay | Admitting: Internal Medicine

## 2016-05-02 ENCOUNTER — Encounter: Payer: Self-pay | Admitting: Internal Medicine

## 2016-05-02 ENCOUNTER — Ambulatory Visit (INDEPENDENT_AMBULATORY_CARE_PROVIDER_SITE_OTHER): Payer: Self-pay | Admitting: Internal Medicine

## 2016-05-02 VITALS — BP 132/78 | HR 82 | Ht 73.0 in | Wt 352.0 lb

## 2016-05-02 DIAGNOSIS — E785 Hyperlipidemia, unspecified: Secondary | ICD-10-CM

## 2016-05-02 DIAGNOSIS — E038 Other specified hypothyroidism: Secondary | ICD-10-CM

## 2016-05-02 DIAGNOSIS — I10 Essential (primary) hypertension: Secondary | ICD-10-CM

## 2016-05-02 DIAGNOSIS — E034 Atrophy of thyroid (acquired): Secondary | ICD-10-CM

## 2016-05-02 DIAGNOSIS — Z0000 Encounter for general adult medical examination without abnormal findings: Secondary | ICD-10-CM

## 2016-05-02 DIAGNOSIS — Z23 Encounter for immunization: Secondary | ICD-10-CM

## 2016-05-02 MED ORDER — TRIAMTERENE-HCTZ 37.5-25 MG PO TABS: 1.0000 | ORAL_TABLET | Freq: Every day | ORAL | 11 refills | Status: DC

## 2016-05-02 MED ORDER — AMLODIPINE BESY-BENAZEPRIL HCL 5-20 MG PO CAPS: 1.0000 | ORAL_CAPSULE | Freq: Every day | ORAL | 11 refills | Status: DC

## 2016-05-02 MED ORDER — LEVOTHYROXINE SODIUM 125 MCG PO TABS: 250.0000 ug | ORAL_TABLET | Freq: Every day | ORAL | 11 refills | Status: DC

## 2016-05-02 MED ORDER — LOVASTATIN 40 MG PO TABS: 40.0000 mg | ORAL_TABLET | Freq: Every day | ORAL | 11 refills | Status: DC

## 2016-05-02 NOTE — Assessment & Plan Note (Signed)
Labs On Lovastatin 

## 2016-05-02 NOTE — Progress Notes (Signed)
Pre visit review using our clinic review tool, if applicable. No additional management support is needed unless otherwise documented below in the visit note. 

## 2016-05-02 NOTE — Assessment & Plan Note (Signed)
Wt Readings from Last 3 Encounters:  05/02/16 (!) 352 lb (159.7 kg)  12/10/14 (!) 351 lb (159.2 kg)  08/06/13 (!) 330 lb (149.7 kg)

## 2016-05-02 NOTE — Progress Notes (Signed)
Subjective:  Patient ID: Randy [name], male    DOB: 08/21/1961  Age: 55 y.o. MRN: [id number]  CC: Annual Exam   HPI Randy [name] presents for well exam  Outpatient Medications Prior to Visit  Medication Sig Dispense Refill  . amLODipine-benazepril (LOTREL) 5-20 MG capsule Take 1 capsule by mouth daily. 30 capsule 1  . levothyroxine (SYNTHROID, LEVOTHROID) 125 MCG tablet Take 2 tablets (250 mcg total) by mouth daily before breakfast. 60 tablet 1  . lovastatin (MEVACOR) 40 MG tablet Take 1 tablet (40 mg total) by mouth at bedtime. 30 tablet 1  . triamterene-hydrochlorothiazide (MAXZIDE-25) 37.5-25 MG tablet Take 1 tablet by mouth daily. 30 tablet 1  . ibuprofen (ADVIL,MOTRIN) 600 MG tablet Take 1 tablet (600 mg total) by mouth every 8 (eight) hours as needed for pain. Take for 1- 2 wks on schedule, then prn (Patient not taking: Reported on 05/02/2016) 90 tablet 2  . triamterene-hydrochlorothiazide (MAXZIDE-25) 37.5-25 MG per tablet Take 1 tablet by mouth daily. (Patient not taking: Reported on 05/02/2016) 30 tablet 11   No facility-administered medications prior to visit.     ROS Review of Systems  Constitutional: Negative for appetite change, fatigue and unexpected weight change.  HENT: Negative for congestion, nosebleeds, sneezing, sore throat and trouble swallowing.   Eyes: Negative for itching and visual disturbance.  Respiratory: Negative for cough.   Cardiovascular: Negative for chest pain, palpitations and leg swelling.  Gastrointestinal: Negative for abdominal distention, blood in stool, diarrhea and nausea.  Genitourinary: Negative for frequency and hematuria.  Musculoskeletal: Negative for back pain, gait problem, joint swelling and neck pain.  Skin: Negative for rash.  Neurological: Negative for dizziness, tremors, speech difficulty and weakness.  Psychiatric/Behavioral: Negative for agitation, dysphoric mood and sleep disturbance. The patient is not nervous/anxious.      Objective:  BP 132/78   Pulse 82   Ht 6\' 1"  (1.854 m)   Wt (!) 352 lb (159.7 kg)   SpO2 96%   BMI 46.44 kg/m   BP Readings from Last 3 Encounters:  05/02/16 132/78  12/10/14 (!) 150/94  08/06/13 130/82    Wt Readings from Last 3 Encounters:  05/02/16 (!) 352 lb (159.7 kg)  12/10/14 (!) 351 lb (159.2 kg)  08/06/13 (!) 330 lb (149.7 kg)    Physical Exam  Constitutional: He is oriented to person, place, and time. He appears well-developed. No distress.  NAD  HENT:  Head: Normocephalic.  Mouth/Throat: Oropharynx is clear and moist.  Eyes: Conjunctivae are normal. Pupils are equal, round, and reactive to light.  Neck: Normal range of motion. No JVD present. No thyromegaly present.  Cardiovascular: Normal rate, regular rhythm, normal heart sounds and intact distal pulses.  Exam reveals no gallop and no friction rub.   No murmur heard. Pulmonary/Chest: Effort normal and breath sounds normal. No respiratory distress. He has no wheezes. He has no rales. He exhibits no tenderness.  Abdominal: Soft. Bowel sounds are normal. He exhibits no distension and no mass. There is no tenderness. There is no rebound and no guarding.  Genitourinary: Rectum normal and prostate normal. Rectal exam shows guaiac negative stool.  Musculoskeletal: Normal range of motion. He exhibits no edema or tenderness.  Lymphadenopathy:    He has no cervical adenopathy.  Neurological: He is alert and oriented to person, place, and time. He has normal reflexes. No cranial nerve deficit. He exhibits normal muscle tone. He displays a negative Romberg sign. Coordination and gait normal.  Skin: Skin  is warm and dry. No rash noted. He is not diaphoretic.  Psychiatric: He has a normal mood and affect. His behavior is normal. Judgment and thought content normal.  Obese   Lab Results  Component Value Date   WBC 5.3 08/16/2013   HGB 15.4 08/16/2013   HCT 45.1 08/16/2013   PLT 249.0 08/16/2013   GLUCOSE 100 (H)  08/16/2013   CHOL 143 08/16/2013   TRIG 106.0 08/16/2013   HDL 28.60 (L) 08/16/2013   LDLCALC 93 08/16/2013   ALT 26 08/16/2013   AST 15 08/16/2013   NA 137 08/16/2013   K 3.8 08/16/2013   CL 102 08/16/2013   CREATININE 1.2 08/16/2013   BUN 15 08/16/2013   CO2 29 08/16/2013   TSH 1.01 08/16/2013   PSA 0.45 06/11/2007   HGBA1C 5.4 08/16/2013    No results found.  Assessment & Plan:   There are no diagnoses linked to this encounter. I am having Randy [name] maintain his ibuprofen, triamterene-hydrochlorothiazide, lovastatin, levothyroxine, and amLODipine-benazepril.  No orders of the defined types were placed in this encounter.    Follow-up: No Follow-up on file.  Sonda PrimesAlex Eriko Economos, MD

## 2016-05-02 NOTE — Assessment & Plan Note (Signed)
We discussed age appropriate health related issues, including available/recomended screening tests and vaccinations. We discussed a need for adhering to healthy diet and exercise. Labs/EKG were reviewed/ordered. All questions were answered.   

## 2016-05-02 NOTE — Assessment & Plan Note (Addendum)
Chronic On Levothroid - 

## 2016-05-02 NOTE — Assessment & Plan Note (Signed)
Lotrel, Maxzide 

## 2016-05-07 ENCOUNTER — Encounter (HOSPITAL_BASED_OUTPATIENT_CLINIC_OR_DEPARTMENT_OTHER): Payer: Self-pay | Admitting: *Deleted

## 2016-05-07 ENCOUNTER — Emergency Department (HOSPITAL_BASED_OUTPATIENT_CLINIC_OR_DEPARTMENT_OTHER)
Admission: EM | Admit: 2016-05-07 | Discharge: 2016-05-07 | Disposition: A | Discharge status: Home / Self Care | Payer: Self-pay | Attending: Emergency Medicine | Admitting: Emergency Medicine

## 2016-05-07 ENCOUNTER — Emergency Department (HOSPITAL_BASED_OUTPATIENT_CLINIC_OR_DEPARTMENT_OTHER): Payer: Self-pay

## 2016-05-07 DIAGNOSIS — Y929 Unspecified place or not applicable: Secondary | ICD-10-CM | POA: Insufficient documentation

## 2016-05-07 DIAGNOSIS — W109XXA Fall (on) (from) unspecified stairs and steps, initial encounter: Secondary | ICD-10-CM | POA: Insufficient documentation

## 2016-05-07 DIAGNOSIS — I10 Essential (primary) hypertension: Secondary | ICD-10-CM | POA: Insufficient documentation

## 2016-05-07 DIAGNOSIS — Y999 Unspecified external cause status: Secondary | ICD-10-CM | POA: Insufficient documentation

## 2016-05-07 DIAGNOSIS — Y939 Activity, unspecified: Secondary | ICD-10-CM | POA: Insufficient documentation

## 2016-05-07 DIAGNOSIS — S8992XA Unspecified injury of left lower leg, initial encounter: Secondary | ICD-10-CM | POA: Insufficient documentation

## 2016-05-07 DIAGNOSIS — W19XXXA Unspecified fall, initial encounter: Secondary | ICD-10-CM

## 2016-05-07 DIAGNOSIS — E039 Hypothyroidism, unspecified: Secondary | ICD-10-CM | POA: Insufficient documentation

## 2016-05-07 DIAGNOSIS — Z79899 Other long term (current) drug therapy: Secondary | ICD-10-CM | POA: Insufficient documentation

## 2016-05-07 MED ORDER — CYCLOBENZAPRINE HCL 10 MG PO TABS: 10.0000 mg | ORAL_TABLET | Freq: Two times a day (BID) | ORAL | 0 refills | Status: DC | PRN

## 2016-05-07 MED ORDER — OXYCODONE-ACETAMINOPHEN 5-325 MG PO TABS: 1.0000 | ORAL_TABLET | Freq: Four times a day (QID) | ORAL | 0 refills | Status: DC | PRN

## 2016-05-07 MED ORDER — METHOCARBAMOL 500 MG PO TABS
1000.0000 mg | ORAL_TABLET | Freq: Once | ORAL | Status: AC
  Administered 2016-05-07: 1000 mg via ORAL
  Filled 2016-05-07: qty 2

## 2016-05-07 MED ORDER — NAPROXEN 500 MG PO TABS: 500.0000 mg | ORAL_TABLET | Freq: Two times a day (BID) | ORAL | 0 refills | Status: DC

## 2016-05-07 NOTE — ED Triage Notes (Signed)
Patient c/o L lower leg injury. He was carrying an exercise bicycle down the stairs and fell down the stairs and the bicycle fell on his leg. Leg was splinted upon arrival. He states that with out the splints his leg automatically bends backwards

## 2016-05-07 NOTE — Discharge Instructions (Signed)
You have been seen today for a knee injury. Your xray showed no fracture or dislocation, however, you should follow up with the orthopedic surgeon for further evaluation and possibly further imaging. Follow up with PCP as needed. Return to ED should symptoms worsen.  Use the knee immobilizer for stability while ambulating. Use is crutches as needed.  Expect your soreness to increase over the next 2-3 days. Take it easy, but do not lay around too much as this may make the stiffness worse. Take 500 mg of naproxen every 12 hours or 800 mg of ibuprofen every 8 hours for the next 3 days. Take these medications with food to avoid upset stomach. Flexeril is a muscle relaxer and may help loosen stiff muscles. Percocet for severe pain. Do not take the Flexeril or Percocet while driving or performing other dangerous activities.

## 2016-05-07 NOTE — ED Notes (Signed)
Pt refused our crutches, has his own from home.

## 2016-05-07 NOTE — ED Provider Notes (Signed)
[facility]   CSN: [id number] Arrival date & time: 05/07/16  1210     History   Chief Complaint Chief Complaint  Patient presents with  . Fall    HPI Randy [name] is a 55 y.o. male.  HPI   Randy [name] is a 55 y.o. male, with a history of Hypertension and hypothyroidism, presenting to the ED with left knee injury that occurred a little over an hour ago. Patient states he was moving a heavy piece of furniture down some stairs, stumbled, and landed on the floor with his left lower leg pinned underneath his buttocks. Complains of anterior superior patellar pain, but states that it is only present with palpation or weightbearing. Also endorses muscle spasms in the hamstring. Patient denies neuro deficits, neck/back pain, LOC, or any other complaints or injuries.     Past Medical History:  Diagnosis Date  . Hyperlipidemia   . Hypertension   . Thyroid disease    hypothyroidism    Patient Active Problem List   Diagnosis Date Noted  . Pain of right heel 01/01/2013  . RUQ abdominal pain 06/19/2012  . Well adult exam 01/27/2012  . HYPOGONADISM 08/24/2009  . 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 COLONIC POLYPS, HX OF 04/29/2009  . Hypothyroidism 06/24/2007  . Dyslipidemia 06/24/2007  . Essential hypertension 06/24/2007  . OBESITY, MORBID 06/22/2007    Past Surgical History:  Procedure Laterality Date  . THYROIDECTOMY, PARTIAL         Home Medications    Prior to Admission medications   Medication Sig Start Date End Date Taking? Authorizing Provider  amLODipine-benazepril (LOTREL) 5-20 MG capsule Take 1 capsule by mouth daily. 05/02/16   Georgina Quint Plotnikov, MD  cyclobenzaprine (FLEXERIL) 10 MG tablet Take 1 tablet (10 mg total) by mouth 2 (two) times daily as needed for muscle spasms. 05/07/16   Sharnelle Cappelli C Cathryn Gallery, PA-C  ibuprofen (ADVIL,MOTRIN) 600 MG tablet Take 1 tablet (600 mg total) by mouth every 8 (eight) hours as needed for pain.  Take for 1- 2 wks on schedule, then prn Patient not taking: Reported on 05/02/2016 01/01/13   Tresa Garter, MD  levothyroxine (SYNTHROID, LEVOTHROID) 125 MCG tablet Take 2 tablets (250 mcg total) by mouth daily before breakfast. 05/02/16   Tresa Garter, MD  lovastatin (MEVACOR) 40 MG tablet Take 1 tablet (40 mg total) by mouth at bedtime. 05/02/16   Georgina Quint Plotnikov, MD  naproxen (NAPROSYN) 500 MG tablet Take 1 tablet (500 mg total) by mouth 2 (two) times daily. 05/07/16   Victory Dresden C Amany Rando, PA-C  oxyCODONE-acetaminophen (PERCOCET/ROXICET) 5-325 MG tablet Take 1-2 tablets by mouth every 6 (six) hours as needed for severe pain. 05/07/16   Vada Yellen C Casimer Russett, PA-C  triamterene-hydrochlorothiazide (MAXZIDE-25) 37.5-25 MG tablet Take 1 tablet by mouth daily. 05/02/16   Tresa Garter, MD    Family History Family History  Problem Relation Age of Onset  . Mental illness Mother     dementia  . Arthritis Father   . COPD Father     Social History Social History  Substance Use Topics  . Smoking status: Never Smoker  . Smokeless tobacco: Never Used  . Alcohol use Yes     Allergies   Review of patient's allergies indicates no known allergies.   Review of Systems Review of Systems  Musculoskeletal: Positive for arthralgias. Negative for back pain, joint swelling and neck pain.  Neurological: Negative for weakness and numbness.  Physical Exam Updated Vital Signs BP 157/73 (BP Location: Right Arm)   Pulse 90   Temp 99.1 F (37.3 C) (Oral)   Resp 22   Ht 6\' 1"  (1.854 m)   Wt (!) 159.7 kg   SpO2 96%   BMI 46.44 kg/m   Physical Exam  Constitutional: He appears well-developed and well-nourished. No distress.  HENT:  Head: Normocephalic and atraumatic.  Eyes: Conjunctivae are normal.  Neck: Neck supple.  Cardiovascular: Normal rate and regular rhythm.   Pulmonary/Chest: Effort normal.  Musculoskeletal:  Patient is unable to extend his left lower leg at the knee. Flexion  intact. The left patella is mobile and feels reduced in size. At 30 flexion of the left knee, there is avoid between the distal femur and patella. No discernible swelling, redness, or other deformity. No laxity or noted effusion.   Neurological: He is alert.  Distal sensation intact bilaterally in the lower extremities. Strength with flexion at the left knee is 5 out of 5. Patient is unable to extend at the left knee.  Skin: Skin is warm and dry. He is not diaphoretic.  Psychiatric: He has a normal mood and affect. His behavior is normal.  Nursing note and vitals reviewed.    ED Treatments / Results  Labs (all labs ordered are listed, but only abnormal results are displayed) Labs Reviewed - No data to display  EKG  EKG Interpretation None       Radiology Dg Knee Complete 4 Views Left  Result Date: 05/07/2016 CLINICAL DATA:  Left knee injury today.  Fall. EXAM: LEFT KNEE - COMPLETE 4+ VIEW COMPARISON:  None. FINDINGS: Mild degenerative changes with spurring. Moderate joint effusion with small intraarticular loose bodies within the suprapatellar bursa. No fracture, subluxation or dislocation. IMPRESSION: Degenerative changes with moderate joint effusion and intra-articular loose bodies. No acute findings. Electronically Signed   By: Charlett Nose M.D.   On: 05/07/2016 13:08    Procedures Procedures (including critical care time)  Medications Ordered in ED Medications  methocarbamol (ROBAXIN) tablet 1,000 mg (1,000 mg Oral Given 05/07/16 1311)     Initial Impression / Assessment and Plan / ED Course  I have reviewed the triage vital signs and the nursing notes.  Pertinent labs & imaging results that were available during my care of the patient were reviewed by me and considered in my medical decision making (see chart for details).  Clinical Course    Randy [name] presents with a left knee injury from a fall that occurred earlier this morning.  Findings and plan of care  discussed with Gwyneth Sprout, MD. Dr. Anitra Lauth personally evaluated and examined this patient.  Suspect quadriceps tendon rupture. Patient was offered analgesics, but declined. No acute abnormalities on x-ray. 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 Knee immobilizer with or without crutches. 1:43 PM Spoke with Dr. Roda Shutters, orthopedic surgeon, who agrees that the patient can follow-up in the office outpatient. Also agrees with the knee immobilizer. This information was communicated with the patient. The patient was given instructions for home care as well as return precautions. Patient voices understanding of these instructions, accepts the plan, and is comfortable with discharge.  Vitals:   05/07/16 1210 05/07/16 1325  BP: 157/73 146/68  Pulse: 90 85  Resp: 22 18  Temp: 99.1 F (37.3 C) 97.9 F (36.6 C)  TempSrc: Oral Oral  SpO2: 96% 99%  Weight: (!) 159.7 kg  Height: 6\' 1"  (1.854 m)      Final Clinical Impressions(s) / ED Diagnoses   Final diagnoses:  Fall, initial encounter  Knee injury, left, initial encounter    New Prescriptions Discharge Medication List as of 05/07/2016  1:50 PM    START taking these medications   Details  cyclobenzaprine (FLEXERIL) 10 MG tablet Take 1 tablet (10 mg total) by mouth 2 (two) times daily as needed for muscle spasms., Starting Sat 05/07/2016, Print    naproxen (NAPROSYN) 500 MG tablet Take 1 tablet (500 mg total) by mouth 2 (two) times daily., Starting Sat 05/07/2016, Print    oxyCODONE-acetaminophen (PERCOCET/ROXICET) 5-325 MG tablet Take 1-2 tablets by mouth every 6 (six) hours as needed for severe pain., Starting Sat 05/07/2016, Print         Anselm PancoastShawn C Decklyn Hyder, PA-C 05/07/16 1725    Gwyneth SproutWhitney Plunkett, MD 05/08/16 [phone]

## 2016-06-15 ENCOUNTER — Ambulatory Visit: Payer: Self-pay | Admitting: Orthopedic Surgery

## 2016-06-23 ENCOUNTER — Encounter (HOSPITAL_BASED_OUTPATIENT_CLINIC_OR_DEPARTMENT_OTHER): Payer: Self-pay | Admitting: *Deleted

## 2016-06-24 ENCOUNTER — Encounter (HOSPITAL_BASED_OUTPATIENT_CLINIC_OR_DEPARTMENT_OTHER): Payer: Self-pay | Admitting: *Deleted

## 2016-06-24 NOTE — Progress Notes (Addendum)
NPO AFTER MN WITH EXCEPTION CLEAR LIQUIDS UNTIL 0800 (NO CREAM/ MILK PRODUCTS).  ARRIVE AT 1200. NEEDS ISTAT 8 AND EKG.  WILL TAKE SYNTHROID AND LOVASTATIN AM DOS W/ SIPS OF WATER.   ADDENDUM:  REVIEWED CHART W/ DR CARIGNAN MDA , OK TO PROCEED.

## 2016-06-24 NOTE — Progress Notes (Signed)
   06/24/16 1002  OBSTRUCTIVE SLEEP APNEA  Have you ever been diagnosed with sleep apnea through a sleep study? No  Do you snore loudly (loud enough to be heard through closed doors)?  1  Do you often feel tired, fatigued, or sleepy during the daytime (such as falling asleep during driving or talking to someone)? 0  Has anyone observed you stop breathing during your sleep? 0  Do you have, or are you being treated for high blood pressure? 1  BMI more than 35 kg/m2? 1  Age > 50 (1-yes) 1  Neck circumference greater than:Male 16 inches or larger, Male 17inches or larger? 1  Male Gender (Yes=1) 1  Obstructive Sleep Apnea Score 6  Score 5 or greater  Results sent to PCP

## 2016-06-28 ENCOUNTER — Encounter (HOSPITAL_BASED_OUTPATIENT_CLINIC_OR_DEPARTMENT_OTHER): Payer: Self-pay | Admitting: Anesthesiology

## 2016-06-28 ENCOUNTER — Ambulatory Visit (HOSPITAL_BASED_OUTPATIENT_CLINIC_OR_DEPARTMENT_OTHER): Payer: Self-pay | Admitting: Anesthesiology

## 2016-06-28 ENCOUNTER — Ambulatory Visit (HOSPITAL_BASED_OUTPATIENT_CLINIC_OR_DEPARTMENT_OTHER)
Admission: RE | Admit: 2016-06-28 | Discharge: 2016-06-28 | Disposition: A | Discharge status: Home / Self Care | Payer: Self-pay | Source: Ambulatory Visit | Attending: Specialist | Admitting: Specialist

## 2016-06-28 ENCOUNTER — Other Ambulatory Visit: Payer: Self-pay

## 2016-06-28 ENCOUNTER — Encounter (HOSPITAL_BASED_OUTPATIENT_CLINIC_OR_DEPARTMENT_OTHER)
Admission: RE | Disposition: A | Discharge status: Home / Self Care | Payer: Self-pay | Source: Ambulatory Visit | Attending: Specialist

## 2016-06-28 DIAGNOSIS — S76112A Strain of left quadriceps muscle, fascia and tendon, initial encounter: Secondary | ICD-10-CM | POA: Insufficient documentation

## 2016-06-28 DIAGNOSIS — W19XXXA Unspecified fall, initial encounter: Secondary | ICD-10-CM | POA: Insufficient documentation

## 2016-06-28 DIAGNOSIS — E039 Hypothyroidism, unspecified: Secondary | ICD-10-CM | POA: Insufficient documentation

## 2016-06-28 DIAGNOSIS — E785 Hyperlipidemia, unspecified: Secondary | ICD-10-CM | POA: Insufficient documentation

## 2016-06-28 DIAGNOSIS — Z9889 Other specified postprocedural states: Secondary | ICD-10-CM

## 2016-06-28 DIAGNOSIS — Z79899 Other long term (current) drug therapy: Secondary | ICD-10-CM | POA: Insufficient documentation

## 2016-06-28 DIAGNOSIS — I10 Essential (primary) hypertension: Secondary | ICD-10-CM | POA: Insufficient documentation

## 2016-06-28 DIAGNOSIS — Z7982 Long term (current) use of aspirin: Secondary | ICD-10-CM | POA: Insufficient documentation

## 2016-06-28 DIAGNOSIS — Z6841 Body Mass Index (BMI) 40.0 and over, adult: Secondary | ICD-10-CM | POA: Insufficient documentation

## 2016-06-28 HISTORY — DX: Hypothyroidism, unspecified: E03.9

## 2016-06-28 HISTORY — DX: Testicular hypofunction: E29.1

## 2016-06-28 HISTORY — PX: QUADRICEPS TENDON REPAIR: SHX756

## 2016-06-28 HISTORY — DX: Other specified personal risk factors, not elsewhere classified: Z91.89

## 2016-06-28 HISTORY — DX: Other specified disorders of tendon, right knee: M67.863

## 2016-06-28 HISTORY — DX: Presence of spectacles and contact lenses: Z97.3

## 2016-06-28 HISTORY — DX: Personal history of other endocrine, nutritional and metabolic disease: Z86.39

## 2016-06-28 LAB — POCT I-STAT, CHEM 8
BUN: 18 mg/dL (ref 6–20)
Calcium, Ion: 1.21 mmol/L (ref 1.15–1.40)
Chloride: 98 mmol/L — ABNORMAL LOW (ref 101–111)
Creatinine, Ser: 1 mg/dL (ref 0.61–1.24)
GLUCOSE: 98 mg/dL (ref 65–99)
HEMATOCRIT: 42 % (ref 39.0–52.0)
HEMOGLOBIN: 14.3 g/dL (ref 13.0–17.0)
POTASSIUM: 3.3 mmol/L — AB (ref 3.5–5.1)
SODIUM: 139 mmol/L (ref 135–145)
TCO2: 28 mmol/L (ref 0–100)

## 2016-06-28 SURGERY — REPAIR, TENDON, QUADRICEPS
Anesthesia: Regional | Site: Knee | Laterality: Left

## 2016-06-28 MED ORDER — POVIDONE-IODINE 7.5 % EX SOLN
Freq: Once | CUTANEOUS | Status: DC
Start: 2016-06-28 — End: 2016-06-28
  Filled 2016-06-28: qty 118

## 2016-06-28 MED ORDER — BUPIVACAINE HCL 0.25 % IJ SOLN
INTRAMUSCULAR | Status: DC | PRN
  Administered 2016-06-28: 20 mL

## 2016-06-28 MED ORDER — LIDOCAINE 2% (20 MG/ML) 5 ML SYRINGE
INTRAMUSCULAR | Status: DC | PRN
  Administered 2016-06-28: 100 mg via INTRAVENOUS

## 2016-06-28 MED ORDER — FENTANYL CITRATE (PF) 100 MCG/2ML IJ SOLN
INTRAMUSCULAR | Status: DC | PRN
  Administered 2016-06-28 (×2): 50 ug via INTRAVENOUS

## 2016-06-28 MED ORDER — METHOCARBAMOL 500 MG PO TABS: 500.0000 mg | ORAL_TABLET | Freq: Four times a day (QID) | ORAL | 2 refills | Status: DC | PRN

## 2016-06-28 MED ORDER — OXYCODONE-ACETAMINOPHEN 5-325 MG PO TABS: 1.0000 | ORAL_TABLET | ORAL | 0 refills | Status: DC | PRN

## 2016-06-28 MED ORDER — BUPIVACAINE-EPINEPHRINE (PF) 0.5% -1:200000 IJ SOLN
INTRAMUSCULAR | Status: DC | PRN
  Administered 2016-06-28: 30 mL via PERINEURAL

## 2016-06-28 MED ORDER — LACTATED RINGERS IV SOLN
INTRAVENOUS | Status: DC
  Filled 2016-06-28: qty 1000

## 2016-06-28 MED ORDER — CEFAZOLIN IN D5W 1 GM/50ML IV SOLN
INTRAVENOUS | Status: AC
  Filled 2016-06-28: qty 50

## 2016-06-28 MED ORDER — ONDANSETRON HCL 4 MG/2ML IJ SOLN
INTRAMUSCULAR | Status: DC | PRN
  Administered 2016-06-28: 4 mg via INTRAVENOUS

## 2016-06-28 MED ORDER — MIDAZOLAM HCL 2 MG/2ML IJ SOLN
INTRAMUSCULAR | Status: AC
  Filled 2016-06-28: qty 2

## 2016-06-28 MED ORDER — ASPIRIN EC 325 MG PO TBEC: 325.0000 mg | DELAYED_RELEASE_TABLET | Freq: Two times a day (BID) | ORAL | 0 refills | Status: DC

## 2016-06-28 MED ORDER — DEXTROSE 5 % IV SOLN
3.0000 g | INTRAVENOUS | Status: AC
  Administered 2016-06-28: 3 g via INTRAVENOUS
  Filled 2016-06-28: qty 3000

## 2016-06-28 MED ORDER — DEXAMETHASONE SODIUM PHOSPHATE 4 MG/ML IJ SOLN
INTRAMUSCULAR | Status: DC | PRN
  Administered 2016-06-28: 10 mg via INTRAVENOUS

## 2016-06-28 MED ORDER — MIDAZOLAM HCL 5 MG/5ML IJ SOLN
INTRAMUSCULAR | Status: DC | PRN
  Administered 2016-06-28: 2 mg via INTRAVENOUS

## 2016-06-28 MED ORDER — CEFAZOLIN SODIUM-DEXTROSE 2-4 GM/100ML-% IV SOLN
INTRAVENOUS | Status: AC
  Filled 2016-06-28: qty 100

## 2016-06-28 MED ORDER — MIDAZOLAM HCL 2 MG/2ML IJ SOLN
1.0000 mg | Freq: Once | INTRAMUSCULAR | Status: AC
  Administered 2016-06-28: 1 mg via INTRAVENOUS
  Filled 2016-06-28: qty 1

## 2016-06-28 MED ORDER — BUPIVACAINE-EPINEPHRINE (PF) 0.5% -1:200000 IJ SOLN
INTRAMUSCULAR | Status: AC
  Filled 2016-06-28: qty 30

## 2016-06-28 MED ORDER — PROPOFOL 10 MG/ML IV BOLUS
INTRAVENOUS | Status: DC | PRN
  Administered 2016-06-28: 300 mg via INTRAVENOUS

## 2016-06-28 MED ORDER — FENTANYL CITRATE (PF) 100 MCG/2ML IJ SOLN
50.0000 ug | Freq: Once | INTRAMUSCULAR | Status: AC
  Administered 2016-06-28: 50 ug via INTRAVENOUS
  Filled 2016-06-28: qty 1

## 2016-06-28 MED ORDER — FENTANYL CITRATE (PF) 100 MCG/2ML IJ SOLN
INTRAMUSCULAR | Status: AC
  Filled 2016-06-28: qty 2

## 2016-06-28 MED ORDER — LACTATED RINGERS IV SOLN
INTRAVENOUS | Status: DC
  Administered 2016-06-28 (×2): via INTRAVENOUS
  Filled 2016-06-28: qty 1000

## 2016-06-28 MED ORDER — CEPHALEXIN 500 MG PO CAPS: 500.0000 mg | ORAL_CAPSULE | Freq: Three times a day (TID) | ORAL | 0 refills | Status: DC

## 2016-06-28 SURGICAL SUPPLY — 92 items
BANDAGE ACE 4X5 VEL STRL LF (GAUZE/BANDAGES/DRESSINGS) ×3 IMPLANT
BANDAGE ACE 6X5 VEL STRL LF (GAUZE/BANDAGES/DRESSINGS) ×3 IMPLANT
BANDAGE ESMARK 6X9 LF (GAUZE/BANDAGES/DRESSINGS) ×1 IMPLANT
BLADE HEX COATED 2.75 (ELECTRODE) ×3 IMPLANT
BLADE SURG 10 STRL SS (BLADE) ×3 IMPLANT
BLADE SURG 15 STRL LF DISP TIS (BLADE) ×4 IMPLANT
BLADE SURG 15 STRL SS (BLADE) ×8
BNDG ESMARK 6X9 LF (GAUZE/BANDAGES/DRESSINGS) ×3
BNDG GAUZE ELAST 4 BULKY (GAUZE/BANDAGES/DRESSINGS) ×3 IMPLANT
CLOSURE WOUND 1/2 X4 (GAUZE/BANDAGES/DRESSINGS)
COVER BACK TABLE 60X90IN (DRAPES) ×3 IMPLANT
COVER MAYO STAND STRL (DRAPES) ×3 IMPLANT
CUFF TOURN SGL QUICK 34 (TOURNIQUET CUFF) ×2
CUFF TOURN SGL QUICK 44 (TOURNIQUET CUFF) IMPLANT
CUFF TRNQT CYL 34X4X40X1 (TOURNIQUET CUFF) ×1 IMPLANT
DRAPE EXTREMITY TIBURON (DRAPES) ×3 IMPLANT
DRAPE IMP U-DRAPE 54X76 (DRAPES) IMPLANT
DRAPE INCISE 23X17 IOBAN STRL (DRAPES)
DRAPE INCISE IOBAN 23X17 STRL (DRAPES) IMPLANT
DRAPE INCISE IOBAN 66X45 STRL (DRAPES) IMPLANT
DRAPE LG THREE QUARTER DISP (DRAPES) ×3 IMPLANT
DRAPE ORTHO SPLIT 77X108 STRL (DRAPES) ×2
DRAPE POUCH INSTRU U-SHP 10X18 (DRAPES) ×3 IMPLANT
DRAPE SURG ORHT 6 SPLT 77X108 (DRAPES) ×1 IMPLANT
DRAPE U-SHAPE 47X51 STRL (DRAPES) ×6 IMPLANT
DRSG ADAPTIC 3X8 NADH LF (GAUZE/BANDAGES/DRESSINGS) IMPLANT
DRSG AQUACEL AG ADV 3.5X 6 (GAUZE/BANDAGES/DRESSINGS) ×3 IMPLANT
DRSG AQUACEL AG ADV 3.5X10 (GAUZE/BANDAGES/DRESSINGS) IMPLANT
DRSG OPSITE POSTOP 4X10 (GAUZE/BANDAGES/DRESSINGS) IMPLANT
DRSG PAD ABDOMINAL 8X10 ST (GAUZE/BANDAGES/DRESSINGS) ×3 IMPLANT
DURAPREP 26ML APPLICATOR (WOUND CARE) ×3 IMPLANT
ELECT REM PT RETURN 9FT ADLT (ELECTROSURGICAL) ×3
ELECTRODE REM PT RTRN 9FT ADLT (ELECTROSURGICAL) ×1 IMPLANT
GAUZE XEROFORM 1X8 LF (GAUZE/BANDAGES/DRESSINGS) IMPLANT
GLOVE BIO SURGEON STRL SZ 6.5 (GLOVE) ×2 IMPLANT
GLOVE BIO SURGEON STRL SZ7.5 (GLOVE) ×3 IMPLANT
GLOVE BIO SURGEON STRL SZ8 (GLOVE) ×3 IMPLANT
GLOVE BIO SURGEONS STRL SZ 6.5 (GLOVE) ×1
GLOVE BIOGEL PI IND STRL 6.5 (GLOVE) ×1 IMPLANT
GLOVE BIOGEL PI IND STRL 7.5 (GLOVE) ×1 IMPLANT
GLOVE BIOGEL PI IND STRL 8 (GLOVE) ×2 IMPLANT
GLOVE BIOGEL PI INDICATOR 6.5 (GLOVE) ×2
GLOVE BIOGEL PI INDICATOR 7.5 (GLOVE) ×2
GLOVE BIOGEL PI INDICATOR 8 (GLOVE) ×4
GOWN STRL REUS W/TWL LRG LVL3 (GOWN DISPOSABLE) ×3 IMPLANT
GOWN STRL REUS W/TWL XL LVL3 (GOWN DISPOSABLE) ×12 IMPLANT
IMMOBILIZER KNEE 20 (SOFTGOODS)
IMMOBILIZER KNEE 20 THIGH 36 (SOFTGOODS) IMPLANT
KIT ROOM TURNOVER WOR (KITS) ×3 IMPLANT
LIQUID BAND (GAUZE/BANDAGES/DRESSINGS) ×3 IMPLANT
MANIFOLD NEPTUNE II (INSTRUMENTS) IMPLANT
NEEDLE 1/2 CIR CATGUT .05X1.09 (NEEDLE) ×3 IMPLANT
NEEDLE HYPO 22GX1.5 SAFETY (NEEDLE) ×3 IMPLANT
NEEDLE SPNL 18GX3.5 QUINCKE PK (NEEDLE) ×3 IMPLANT
NS IRRIG 500ML POUR BTL (IV SOLUTION) ×3 IMPLANT
PACK BASIN DAY SURGERY FS (CUSTOM PROCEDURE TRAY) ×3 IMPLANT
PAD ARMBOARD 7.5X6 YLW CONV (MISCELLANEOUS) IMPLANT
PADDING CAST ABS 4INX4YD NS (CAST SUPPLIES)
PADDING CAST ABS COTTON 4X4 ST (CAST SUPPLIES) IMPLANT
PASSER SUT SWANSON 36MM LOOP (INSTRUMENTS) ×3 IMPLANT
PENCIL BUTTON HOLSTER BLD 10FT (ELECTRODE) ×3 IMPLANT
SET PAD KNEE POSITIONER (MISCELLANEOUS) IMPLANT
SPONGE GAUZE 4X4 12PLY STER LF (GAUZE/BANDAGES/DRESSINGS) ×3 IMPLANT
SPONGE LAP 18X18 X RAY DECT (DISPOSABLE) ×3 IMPLANT
SPONGE LAP 4X18 X RAY DECT (DISPOSABLE) IMPLANT
STAPLER VISISTAT 35W (STAPLE) IMPLANT
STOCKINETTE 6  STRL (DRAPES)
STOCKINETTE 6 STRL (DRAPES) IMPLANT
STOCKINETTE IMPERVIOUS LG (DRAPES) ×3 IMPLANT
STRIP CLOSURE SKIN 1/2X4 (GAUZE/BANDAGES/DRESSINGS) IMPLANT
SUCTION FRAZIER TIP 10 FR DISP (SUCTIONS) IMPLANT
SUT ETHILON 4 0 PS 2 18 (SUTURE) IMPLANT
SUT FIBERWIRE #2 38 REV NDL BL (SUTURE)
SUT FIBERWIRE #2 38 T-5 BLUE (SUTURE) ×3
SUT MNCRL AB 4-0 PS2 18 (SUTURE) ×3 IMPLANT
SUT PDS AB 2-0 CT2 27 (SUTURE) IMPLANT
SUT TIGER TAPE 7 IN WHITE (SUTURE) ×3 IMPLANT
SUT VIC AB 0 CT1 36 (SUTURE) ×3 IMPLANT
SUT VIC AB 2-0 CT1 (SUTURE) ×3 IMPLANT
SUTURE FIBERWR #2 38 T-5 BLUE (SUTURE) ×1 IMPLANT
SUTURE FIBERWR#2 38 REV NDL BL (SUTURE) IMPLANT
SYR 30ML LL (SYRINGE) ×3 IMPLANT
SYR BULB IRRIGATION 50ML (SYRINGE) ×3 IMPLANT
SYR CONTROL 10ML LL (SYRINGE) ×3 IMPLANT
SYS INTERNAL BRACE KNEE (Miscellaneous) ×3 IMPLANT
SYSTEM INTERNAL BRACE KNEE (Miscellaneous) ×1 IMPLANT
TOWEL OR 17X24 6PK STRL BLUE (TOWEL DISPOSABLE) ×6 IMPLANT
TRAY FOLEY CATH SILVER 14FR (SET/KITS/TRAYS/PACK) IMPLANT
TUBE CONNECTING 12'X1/4 (SUCTIONS) ×1
TUBE CONNECTING 12X1/4 (SUCTIONS) ×2 IMPLANT
WATER STERILE IRR 500ML POUR (IV SOLUTION) ×3 IMPLANT
YANKAUER SUCT BULB TIP NO VENT (SUCTIONS) ×3 IMPLANT

## 2016-06-28 NOTE — Anesthesia Preprocedure Evaluation (Addendum)
Anesthesia Evaluation  Patient identified by MRN, date of birth, ID band Patient awake    Reviewed: Allergy & Precautions, NPO status , Patient's Chart, lab work & pertinent test results  Airway Mallampati: II  TM Distance: >3 FB Neck ROM: Full    Dental  (+) Dental Advisory Given   Pulmonary neg pulmonary ROS,    breath sounds clear to auscultation       Cardiovascular hypertension, Pt. on medications  Rhythm:Regular Rate:Normal     Neuro/Psych negative neurological ROS     GI/Hepatic negative GI ROS, Neg liver ROS,   Endo/Other  Hypothyroidism Morbid obesity  Renal/GU negative Renal ROS     Musculoskeletal   Abdominal   Peds  Hematology negative hematology ROS (+)   Anesthesia Other Findings   Reproductive/Obstetrics                            Anesthesia Physical Anesthesia Plan  ASA: III  Anesthesia Plan: General and Regional   Post-op Pain Management:  Regional for Post-op pain   Induction: Intravenous  Airway Management Planned: Oral ETT and LMA  Additional Equipment:   Intra-op Plan:   Post-operative Plan: Extubation in OR  Informed Consent: I have reviewed the patients History and Physical, chart, labs and discussed the procedure including the risks, benefits and alternatives for the proposed anesthesia with the patient or authorized representative who has indicated his/her understanding and acceptance.   Dental advisory given  Plan Discussed with: CRNA  Anesthesia Plan Comments:         Anesthesia Quick Evaluation

## 2016-06-28 NOTE — Op Note (Signed)
Dictated# [id number]

## 2016-06-28 NOTE — Anesthesia Procedure Notes (Signed)
Anesthesia Regional Block:  Femoral nerve block  Pre-Anesthetic Checklist: ,, timeout performed, Correct Patient, Correct Site, Correct Laterality, Correct Procedure, Correct Position, site marked, Risks and benefits discussed,  Surgical consent,  Pre-op evaluation,  At surgeon's request and post-op pain management  Laterality: Left  Prep: chloraprep       Needles:  Injection technique: Single-shot  Needle Type: Echogenic Stimulator Needle     Needle Length: 9cm 9 cm Needle Gauge: 21 and 21 G    Additional Needles:  Procedures: ultrasound guided (picture in chart) and nerve stimulator Femoral nerve block  Nerve Stimulator or Paresthesia:  Response: quadraceps contraction, 0.45 mA,   Additional Responses:   Narrative:  Start time: 06/28/2016 1:06 PM End time: 06/28/2016 1:13 PM Injection made incrementally with aspirations every 5 mL.  Performed by: Personally  Anesthesiologist: Marcene DuosFITZGERALD, Glenora Morocho

## 2016-06-28 NOTE — H&P (Signed)
Randy [name] is an 55 y.o. male.   Chief Complaint: Left knee pain HPI: Patient presents with joint discomfort that had been persistent for several months now related to a fall. Despite conservative treatments, his discomfort has not improved. Imaging was obtained. Other conservative and surgical treatments were discussed in detail. Patient wishes to proceed with surgery as consented. He has been compliant with wearing his brace as directed. Denies SOB, CP, or calf pain. No Fever, chills, or nausea/ vomiting.   Past Medical History:  Diagnosis Date  . At risk for sleep apnea    STOP-BANG= 6         SENT TO PCP 06-24-2016  . History of thyroid nodule    s/p  right thyroid lopectomy--  per path report:  follicular adenoma, chronic thyroiditis  . Hyperlipidemia   . Hypertension   . Hypogonadism male   . Hypothyroidism   . Other specified disorders of tendon, right knee    quad tendon tear  . Wears glasses     Past Surgical History:  Procedure Laterality Date  . COLONOSCOPY  2006  . EYE SURGERY Right age 55   removal glass  . HYPOSPADIAS CORRECTION  age 89  . THYROID LOBECTOMY Right 02/03/2005    Family History  Problem Relation Age of Onset  . Mental illness Mother     dementia  . Arthritis Father   . COPD Father    Social History:  reports that he has never smoked. He has never used smokeless tobacco. He reports that he drinks alcohol. He reports that he does not use drugs.  Allergies: No Known Allergies  Medications Prior to Admission  Medication Sig Dispense Refill  . amLODipine-benazepril (LOTREL) 5-20 MG capsule Take 1 capsule by mouth daily. (Patient taking differently: Take 1 capsule by mouth every morning. ) 30 capsule 11  . aspirin EC 81 MG tablet Take 81 mg by mouth daily.    . Cholecalciferol (VITAMIN D-3) 1000 units CAPS Take 1 capsule by mouth every morning.    . Coenzyme Q10 (COQ10) 100 MG CAPS Take 1 capsule by mouth every morning.    . Cyanocobalamin (B-12)  2000 MCG TABS Take 1 tablet by mouth every morning.    Marland Kitchen. ibuprofen (ADVIL,MOTRIN) 200 MG tablet Take 200 mg by mouth every 6 (six) hours as needed.    Marland Kitchen. levothyroxine (SYNTHROID, LEVOTHROID) 125 MCG tablet Take 2 tablets (250 mcg total) by mouth daily before breakfast. 60 tablet 11  . lovastatin (MEVACOR) 40 MG tablet Take 1 tablet (40 mg total) by mouth at bedtime. (Patient taking differently: Take 40 mg by mouth every morning. ) 30 tablet 11  . Misc Natural Products (TART CHERRY ADVANCED) CAPS Take 1 capsule by mouth every morning.    . Multiple Vitamin (MULTIVITAMIN) tablet Take 1 tablet by mouth every morning.    . Omega-3 Fatty Acids (FISH OIL) 1000 MG CAPS Take 1 capsule by mouth every morning.    . triamterene-hydrochlorothiazide (MAXZIDE-25) 37.5-25 MG tablet Take 1 tablet by mouth daily. (Patient taking differently: Take 1 tablet by mouth every morning. ) 30 tablet 11  . Turmeric 500 MG TABS Take 1 capsule by mouth every morning.      Results for orders placed or performed during the hospital encounter of 06/28/16 (from the past 48 hour(s))  I-STAT, chem 8     Status: Abnormal   Collection Time: 06/28/16  1:06 PM  Result Value Ref Range   Sodium 139 135 -  145 mmol/L   Potassium 3.3 (L) 3.5 - 5.1 mmol/L   Chloride 98 (L) 101 - 111 mmol/L   BUN 18 6 - 20 mg/dL   Creatinine, Ser 9.60 0.61 - 1.24 mg/dL   Glucose, Bld 98 65 - 99 mg/dL   Calcium, Ion 4.54 0.98 - 1.40 mmol/L   TCO2 28 0 - 100 mmol/L   Hemoglobin 14.3 13.0 - 17.0 g/dL   HCT 11.9 14.7 - 82.9 %   No results found.  Review of Systems  Constitutional: Negative.   HENT: Negative.   Eyes: Negative.   Respiratory: Negative.   Gastrointestinal: Negative.   Genitourinary: Negative.   Musculoskeletal: Positive for joint pain.  Skin: Negative.   Neurological: Negative.   Endo/Heme/Allergies: Negative.   Psychiatric/Behavioral: Negative.     Blood pressure (!) 143/66, pulse 88, temperature 98.7 F (37.1 C),  temperature source Oral, resp. rate (!) 21, height 6\' 1"  (1.854 m), weight (!) 158.3 kg (349 lb), SpO2 98 %. Physical Exam  Constitutional: He is oriented to person, place, and time. He appears well-developed.  HENT:  Head: Normocephalic.  Eyes: EOM are normal.  Neck: Normal range of motion.  Cardiovascular: Normal rate and intact distal pulses.   Respiratory: Effort normal.  GI: Soft.  Genitourinary:  Genitourinary Comments: Deferred  Musculoskeletal:  Left kne limited motion with extension. LLE grossly n/v intact. Plantar and dorsi flexion intact.  Neurological: He is alert and oriented to person, place, and time.  Skin: Skin is warm and dry.  Psychiatric: His behavior is normal.     Assessment/Plan Left quad tendon tear: Left quad tendon reapiar at [facility] D/c home with family Follow instructions F/u in office Take medications as directed    STILWELL, Herbie Baltimore, PA-C 06/28/2016, 3:18 PM

## 2016-06-28 NOTE — Transfer of Care (Signed)
Immediate Anesthesia Transfer of Care Note  Patient: Randy [name]  Procedure(s) Performed: Procedure(s): LEFT KNEE REPAIR QUADRICEP TENDON (Left)  Patient Location: PACU  Anesthesia Type:General  Level of Consciousness: awake and oriented  Airway & Oxygen Therapy: Patient Spontanous Breathing and Patient connected to nasal cannula oxygen  Post-op Assessment: Report given to RN  Post vital signs: Reviewed and stable  Last Vitals: 170/79, 81, 16, 100%, 97.9 Vitals:   06/28/16 1337 06/28/16 1345  BP:  (!) 143/66  Pulse: 81 88  Resp: (!) 21 (!) 21  Temp:      Last Pain:  Vitals:   06/28/16 1221  TempSrc: Oral      Patients Stated Pain Goal: 5 (06/28/16 1236)  Complications: No apparent anesthesia complications

## 2016-06-28 NOTE — Anesthesia Procedure Notes (Signed)
Procedure Name: LMA Insertion Date/Time: 06/28/2016 3:58 PM Performed by: Maris BergerENENNY, Darleth Eustache T Pre-anesthesia Checklist: Patient identified, Emergency Drugs available, Suction available and Patient being monitored Patient Re-evaluated:Patient Re-evaluated prior to inductionOxygen Delivery Method: Circle system utilized Preoxygenation: Pre-oxygenation with 100% oxygen Intubation Type: IV induction Ventilation: Mask ventilation without difficulty LMA: LMA inserted LMA Size: 5.0 Number of attempts: 1 Airway Equipment and Method: Bite block Placement Confirmation: positive ETCO2 Tube secured with: Tape Dental Injury: Teeth and Oropharynx as per pre-operative assessment

## 2016-06-28 NOTE — Discharge Instructions (Signed)
°  Post Anesthesia Home Care Instructions ° °Activity: °Get plenty of rest for the remainder of the day. A responsible adult should stay with you for 24 hours following the procedure.  °For the next 24 hours, DO NOT: °-Drive a car °-Operate machinery °-Drink alcoholic beverages °-Take any medication unless instructed by your physician °-Make any legal decisions or sign important papers. ° °Meals: °Start with liquid foods such as gelatin or soup. Progress to regular foods as tolerated. Avoid greasy, spicy, heavy foods. If nausea and/or vomiting occur, drink only clear liquids until the nausea and/or vomiting subsides. Call your physician if vomiting continues. ° °Special Instructions/Symptoms: °Your throat may feel dry or sore from the anesthesia or the breathing tube placed in your throat during surgery. If this causes discomfort, gargle with warm salt water. The discomfort should disappear within 24 hours. ° °If you had a scopolamine patch placed behind your ear for the management of post- operative nausea and/or vomiting: ° °1. The medication in the patch is effective for 72 hours, after which it should be removed.  Wrap patch in a tissue and discard in the trash. Wash hands thoroughly with soap and water. °2. You may remove the patch earlier than 72 hours if you experience unpleasant side effects which may include dry mouth, dizziness or visual disturbances. °3. Avoid touching the patch. Wash your hands with soap and water after contact with the patch. °  °Regional Anesthesia Blocks ° °1. Numbness or the inability to move the "blocked" extremity may last from 3-48 hours after placement. The length of time depends on the medication injected and your individual response to the medication. If the numbness is not going away after 48 hours, call your surgeon. ° °2. The extremity that is blocked will need to be protected until the numbness is gone and the  Strength has returned. Because you cannot feel it, you will need  to take extra care to avoid injury. Because it may be weak, you may have difficulty moving it or using it. You may not know what position it is in without looking at it while the block is in effect. ° °3. For blocks in the legs and feet, returning to weight bearing and walking needs to be done carefully. You will need to wait until the numbness is entirely gone and the strength has returned. You should be able to move your leg and foot normally before you try and bear weight or walk. You will need someone to be with you when you first try to ensure you do not fall and possibly risk injury. ° °4. Bruising and tenderness at the needle site are common side effects and will resolve in a few days. ° °5. Persistent numbness or new problems with movement should be communicated to the surgeon or the [facility] ([phone])/ [facility] ([phone]). °

## 2016-06-28 NOTE — Interval H&P Note (Signed)
History and Physical Interval Note:  06/28/2016 3:49 PM  Randy [name]  has presented today for surgery, with the diagnosis of Left Knee Quad Tendon Tear   The various methods of treatment have been discussed with the patient and family. After consideration of risks, benefits and other options for treatment, the patient has consented to  Procedure(s): LEFT KNEEREPAIR QUADRICEP TENDON (Left) as a surgical intervention .  The patient's history has been reviewed, patient examined, no change in status, stable for surgery.  I have reviewed the patient's chart and labs.  Questions were answered to the patient's satisfaction.     Kristyna Bradstreet ANDREW

## 2016-06-28 NOTE — H&P (View-Only) (Signed)
   06/24/16 1002  OBSTRUCTIVE SLEEP APNEA  Have you ever been diagnosed with sleep apnea through a sleep study? No  Do you snore loudly (loud enough to be heard through closed doors)?  1  Do you often feel tired, fatigued, or sleepy during the daytime (such as falling asleep during driving or talking to someone)? 0  Has anyone observed you stop breathing during your sleep? 0  Do you have, or are you being treated for high blood pressure? 1  BMI more than 35 kg/m2? 1  Age > 50 (1-yes) 1  Neck circumference greater than:Male 16 inches or larger, Male 17inches or larger? 1  Male Gender (Yes=1) 1  Obstructive Sleep Apnea Score 6  Score 5 or greater  Results sent to PCP   

## 2016-06-28 NOTE — Anesthesia Postprocedure Evaluation (Signed)
Anesthesia Post Note  Patient: Randy [name]  Procedure(s) Performed: Procedure(s) (LRB): LEFT KNEE REPAIR QUADRICEP TENDON (Left)  Patient location during evaluation: PACU Anesthesia Type: General Level of consciousness: sedated Pain management: pain level controlled Vital Signs Assessment: post-procedure vital signs reviewed and stable Respiratory status: spontaneous breathing and respiratory function stable Cardiovascular status: stable Anesthetic complications: no    Last Vitals:  Vitals:   06/28/16 1745 06/28/16 1800  BP: (!) 142/78 (!) 156/81  Pulse: 78 81  Resp: 19 16  Temp:      Last Pain:  Vitals:   06/28/16 1221  TempSrc: Oral      LLE Sensation: Full sensation;No pain (06/28/16 1800)          Cj Beecher DANIEL

## 2016-06-29 ENCOUNTER — Encounter (HOSPITAL_BASED_OUTPATIENT_CLINIC_OR_DEPARTMENT_OTHER): Payer: Self-pay | Admitting: Specialist

## 2016-06-29 NOTE — Op Note (Deleted)
  The note originally documented on this encounter has been moved the the encounter in which it belongs.  

## 2016-06-29 NOTE — Op Note (Signed)
NAME:  Randy [name], Randy [name]                 ACCOUNT NO.:  [id number]  MEDICAL RECORD NO.:  [id number]  LOCATION:                                FACILITY:  [facility]  PHYSICIAN:  Randy [name], M.D.DATE OF BIRTH:  07-Nov-1960  DATE OF PROCEDURE:  06/28/2016 DATE OF DISCHARGE:  06/28/2016                              OPERATIVE REPORT   PREOPERATIVE DIAGNOSIS:  Left knee quadriceps tendon tear.  POSTOPERATIVE DIAGNOSIS:  Left knee quadriceps tendon tear.  PROCEDURE:  Left knee exploration and repair of quadriceps tendon.  SURGEON:  Randy Leventhalobert Andrew Fia Hebert, MD.  ASSISTANT:  Randy LoaderBryson Stilwell, PA-C.  ANESTHESIA:  Femoral nerve block and general.  ESTIMATED BLOOD LOSS:  Minimal.  DRAINS:  None.  COMPLICATION:  None.  TOURNIQUET TIME:  50 minutes at 300 mmHg.  COMPLICATIONS:  None.  DISPOSITION:  PACU, stable.  OPERATIVE DETAILS:  The patient and family counseled in the holding area.  Correct site was identified.  IV started and block had been administered.  IV antibiotics were given.  He was taken to the OR, placed in supine position under general anesthesia.  All extremities were well padded and bumped.  The foot was elevated, prepped with DuraPrep, and draped in standard sterile fashion.  After time-out, exsanguinated with Esmarch, tourniquet inflated to 300 mmHg due to the large size of thigh.  Straight midline incision was made through skin and subcutaneous tissue.  Medial and lateral soft tissue flaps were developed outside of the retinaculum.  It was found that I did not tear the medial and lateral capsule but complete disruption of the quadriceps tendon which __________ 2.5-3 cm.  Since it had been 6 weeks, it was well scarred down.  I did immobilize this both intra-articular and extra- articular, proximal and distal.  Torque to be brought into full extension without __________ proximally to the patella without any excessive tension.  Scar tissue was removed back to healthy  tissue. Also in the patella, took out the scar tissue with a rongeur to get down to fresh bone.  Two guidepins were then placed in patella longitudinally over the reamer.  At this point in time, I utilized Arthrex LabralTape, suture placed in a Krackow fashion in quadriceps tendon to these.  The quadriceps tendon brought distal __________ proximal.  They were then placed in two 4.75 SwiveLock anchors and screwed in position.  This reapproximated the correct tension of the patella tendon or quadriceps tendon of the patella.  I then over sewed on both top on the dorsoulnar anterior side again with FiberWire suture.  The lateral retinaculum was closed.  The medial was not ruptured much.  Irrigated each layer during the time of the closure and also prior to closing the quadriceps tendon back to the patella.  Subcu closed with Vicryl.  This reapproximated the retinaculum site.  Skin closure with subcuticular marker suture.  Glue was applied along with Aquacel dressing.  A sterile compressive dressing was applied.  He was placed into a knee brace in full extension, he brought within.  Then, an ice pack on top of that.  There were no complications or problems.  He was awakened and taken from  the operative room to PACU in stable condition.  Tourniquet was deflated.  He had normal strength to left foot and ankle at the end of the case.  He will be stabilized in PACU and discharged home.  To help with the patient's positioning, prepping, draping, technical and surgical assistance throughout the entire case, wound closure, application of dressing and brace, Mr. Randy Loader, PA-C's assistance was needed.          ______________________________ Randy [name], M.D.     RAC/MEDQ  D:  06/28/2016  T:  06/29/2016  Job:  [id number]

## 2016-08-08 ENCOUNTER — Ambulatory Visit: Payer: Self-pay | Attending: Orthopedic Surgery | Admitting: Physical Therapy

## 2016-08-08 ENCOUNTER — Telehealth: Payer: Self-pay | Admitting: *Deleted

## 2016-08-08 DIAGNOSIS — M25662 Stiffness of left knee, not elsewhere classified: Secondary | ICD-10-CM | POA: Insufficient documentation

## 2016-08-08 DIAGNOSIS — R2689 Other abnormalities of gait and mobility: Secondary | ICD-10-CM | POA: Insufficient documentation

## 2016-08-08 DIAGNOSIS — R262 Difficulty in walking, not elsewhere classified: Secondary | ICD-10-CM | POA: Insufficient documentation

## 2016-08-08 DIAGNOSIS — M25562 Pain in left knee: Secondary | ICD-10-CM | POA: Insufficient documentation

## 2016-08-08 NOTE — Telephone Encounter (Signed)
Rec'd call from pharmacist stating pt has transferred to them, and is needing refill on his Levothyroxine. Since med is consider therapeutic needing to get ok to order from different manufacturer. [facility] uses [employer] and [facility] order from [employer] manufacturer...Raechel Chute/lmb

## 2016-08-08 NOTE — Telephone Encounter (Signed)
OK. Thx

## 2016-08-09 NOTE — Telephone Encounter (Signed)
Notified pharmacy [place] w/MD response...Raechel Chute/lmb

## 2016-08-09 NOTE — Therapy (Signed)
[facility] [address]  [address] [place], [place], [address] Phone: [phone]   Fax:  [phone]  Physical Therapy Evaluation  Patient Details  Name: Randy [name] MRN: [id number] Date of Birth: [birth year] Referring Provider: Arsenio Loader PA-C for R. Valma Cava, MD  Encounter Date: 08/08/2016      PT End of Session - 08/08/16 1500    Visit Number 1   Number of Visits 24   Date for PT Re-Evaluation 11/04/16   Authorization Type Self pay   PT Start Time 1408   PT Stop Time 1458   PT Time Calculation (min) 50 min   Activity Tolerance Patient tolerated treatment well   Behavior During Therapy [facility] for tasks assessed/performed      Past Medical History:  Diagnosis Date  . At risk for sleep apnea    STOP-BANG= 6         SENT TO PCP 06-24-2016  . History of thyroid nodule    s/p  right thyroid lopectomy--  per path report:  follicular adenoma, chronic thyroiditis  . Hyperlipidemia   . Hypertension   . Hypogonadism male   . Hypothyroidism   . Other specified disorders of tendon, right knee    quad tendon tear  . Wears glasses     Past Surgical History:  Procedure Laterality Date  . COLONOSCOPY  2006  . EYE SURGERY Right age 55   removal glass  . HYPOSPADIAS CORRECTION  age 55  . QUADRICEPS TENDON REPAIR Left 06/28/2016   Procedure: LEFT KNEE REPAIR QUADRICEP TENDON;  Surgeon: Eugenia Mcalpine, MD;  Location: [facility];  Service: Orthopedics;  Laterality: Left;  . THYROID LOBECTOMY Right 02/03/2005    There were no vitals filed for this visit.       Subjective Assessment - 08/08/16 1413    Subjective Pt reports he injured his knee on 05/07/16 while attempting to move furniture down stairs when furniture shifted causing him to fall down 4 stairs landing on R knee. Underwent surgical repair for L quad tendon rupture on 06/28/16. Has been locked in full extension in knee brace since surgery  for ambulation, but has been able to sleep w/o brace.   Pertinent History L Quad tendon rupture repair 06/28/16   Patient Stated Goals "Get my knee back to normal"   Currently in Pain? No/denies   Pain Score 0-No pain  only very mild pain up to 1-2/10 at worst   Pain Location Knee   Pain Orientation Left   Pain Descriptors / Indicators Dull;Aching   Pain Type Surgical pain   Pain Onset More than a month ago   Pain Frequency Intermittent   Aggravating Factors  increased activity   Pain Relieving Factors ibuprofen   Effect of Pain on Daily Activities more limited by ROM than pain            [facility] PT Assessment - 08/08/16 1410      Assessment   Medical Diagnosis L quad tendon repair   Referring Provider Arsenio Loader PA-C for R. Valma Cava, MD   Onset Date/Surgical Date 06/28/16     Precautions   Precautions Fall   Required Braces or Orthoses Knee Immobilizer - Left   Knee Immobilizer - Left On when out of bed or walking     Restrictions   Weight Bearing Restrictions Yes   LLE Weight Bearing Weight bearing as tolerated  in locked brace  Balance Screen   Has the patient fallen in the past 6 months Yes   How many times? 3  1 at time of injury, 2 while in knee immobilizer prior to sx   Has the patient had a decrease in activity level because of a fear of falling?  Yes   Is the patient reluctant to leave their home because of a fear of falling?  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   Leisure playing with dog and son     Observation/Other  Assessments   Focus on Therapeutic Outcomes (FOTO)  50% (50% limitation); predicted 64% (36% limitation)     ROM / Strength   AROM / PROM / Strength AROM;Strength     AROM   AROM Assessment Site Knee   Right/Left Knee Left;Right   Right Knee Extension 0   Right Knee Flexion 120   Left Knee Extension 0  supported   Left Knee Flexion 35  seated     Strength   Strength Assessment Site Hip   Right/Left Hip Left   Left Hip Flexion 3/5   Left Hip Extension 3/5   Left Hip ABduction 3+/5   Left Hip ADduction 3-/5     Flexibility   Soft Tissue Assessment /Muscle Length yes   Hamstrings mild tightness bilaterally   Quadriceps NT   ITB mild/mod tightness bilaterally     Ambulation/Gait   Gait Pattern Wide base of support;Lateral trunk lean to right;Decreased weight shift to left;Decreased hip/knee flexion - left;Left hip hike;Step-through pattern                   [facility] Adult PT Treatment/Exercise - 08/08/16 1410      Exercises   Exercises Knee/Hip     Knee/Hip Exercises: Stretches   Passive Hamstring Stretch Left;30 seconds;1 rep   Passive Hamstring Stretch Limitations supine with strap   ITB Stretch Left;30 seconds;1 rep   ITB Stretch Limitations supine with strap     Knee/Hip Exercises: Standing   Hip Abduction Left;10 reps;Knee straight   Abduction Limitations in knee immobilizer, UE supprt on counter   Hip Extension Left;10 reps;Knee straight   Extension Limitations in knee immobilizer, UE supprt on counter     Knee/Hip Exercises: Seated   Abduction/Adduction  Left;10 reps   Abd/Adduction Limitations instructions to keep heel supported on floor     Knee/Hip Exercises: Supine   Quad Sets Left;10 reps  5" hold   Other Supine Knee/Hip Exercises Hip ABD/ADD x10                PT Education - 08/08/16 1455    Education provided Yes   Education Details PT eval findings, POC & initial HEP   Person(s) Educated Patient   Methods  Explanation;Demonstration;Handout   Comprehension Verbalized understanding;Returned demonstration;Need further instruction          PT Short Term Goals - 08/08/16 1458      PT SHORT TERM GOAL #1   Title Independent with initial HEP by 09/09/16  Status New     PT SHORT TERM GOAL #2   Title L knee ROM 0-90 w/o pain by 09/09/16   Status New           PT Long Term Goals - 08/08/16 1458      PT LONG TERM GOAL #1   Title Independent with advanced HEP by 11/05/15   Status New     PT LONG TERM GOAL #2   Title L knee AROM >/= 0-110 by 11/05/15   Status New     PT LONG TERM GOAL #3   Title L hip strength >/= 4+/5 by 11/05/15   Status New     PT LONG TERM GOAL #4   Title L knee strength >/= 4+/5 by 11/05/15   Status New     PT LONG TERM GOAL #5   Title Pt able to perform all normal daily chores, ambulate and negotiate stairs w/o restriction due to L knee LOM, weakness or pain by 11/05/15   Status New               Plan - 08/08/16 1458    Clinical Impression Statement Roseanne RenoStewart is a 55 y/o male who presents to OP PT ~6 weeks s/p L quadriceps tendon repair on 06/28/16. Pt presents to PT in knee brace locked in full extension but w/o assistive device for ambulation. Gait pattern demonstrates wide BOS and hip hike/circumduction due to decreased L hip and knee flexion during swing through (secondary to knee brace) with increased weight shift to R. Pt reports no pain most of the time with only mild pain up to 1-2/10 with fatigue after increased activity.  Assessment reveals L knee ROM 0-35 dg. Mild to mod tightness noted in B hamstrings, ITB and gastrocs, with decreased L patellar mobility also noted. L hip strength limited relative to R (refer to above MMT). POC will focus on improving LE soft tissue pliability, increasing L knee ROM, core/LE strengthening, proprioceptive and stability training, gait training working on weaning from knee brace as instructed by MD, with manual therapy  PRN for ROM/pain/edema and modalities PRN for pain/edema. POC established for 2x/wk but will modify as appropriate due to pt w/o insurance coverage for PT.   Rehab Potential Good   Clinical Impairments Affecting Rehab Potential morbid obesity, HTN   PT Frequency 2x / week  1-2x/wk as indicated by progress due to pt is self-pay   PT Duration 12 weeks   PT Treatment/Interventions Patient/family education;Therapeutic exercise;Neuromuscular re-education;Manual techniques;Scar mobilization;Taping;Therapeutic activities;Functional mobility training;Gait training;Stair training;Electrical Stimulation;Cryotherapy;Vasopneumatic Device;ADLs/Self Care Home Management   PT Next Visit Plan Review initial HEP; Gently progress L knee ROM; Hip strengthening; Progress to knee strengthening per MD   Consulted and Agree with Plan of Care Patient      Patient will benefit from skilled therapeutic intervention in order to improve the following deficits and impairments:  Decreased range of motion, Impaired flexibility, Decreased strength, Increased fascial restricitons, Decreased scar mobility, Difficulty walking, Abnormal gait, Decreased activity tolerance, Decreased endurance, Pain  Visit Diagnosis: Stiffness of left knee, not elsewhere classified  Difficulty in walking, not elsewhere classified  Other abnormalities of gait and mobility  Acute pain of left knee     Problem List Patient Active Problem List   Diagnosis Date Noted  . S/P knee surgery 06/28/2016  . Pain of right heel 01/01/2013  . RUQ abdominal pain 06/19/2012  . Well adult exam 01/27/2012  . HYPOGONADISM 08/24/2009  . 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  COLONIC POLYPS, HX OF 04/29/2009  . Hypothyroidism 06/24/2007  . Dyslipidemia 06/24/2007  . Essential hypertension 06/24/2007  . OBESITY, MORBID 06/22/2007    Marry Guan, PT, MPT 08/09/2016, 8:18 AM  [facility] [address]  [address] [place], [place], [address] Phone: [phone]   Fax:  [phone]  Name: DUY LEMMING MRN: [id number] Date of Birth: 1961-06-06

## 2016-08-12 ENCOUNTER — Telehealth: Payer: Self-pay | Admitting: *Deleted

## 2016-08-12 NOTE — Telephone Encounter (Signed)
I'll check Thx

## 2016-08-12 NOTE — Telephone Encounter (Signed)
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 At first he states he thought he did not need it. Now he needs form completed. Do you have the form?

## 2016-08-15 ENCOUNTER — Ambulatory Visit: Payer: Self-pay | Attending: Orthopedic Surgery | Admitting: Physical Therapy

## 2016-08-15 DIAGNOSIS — M25662 Stiffness of left knee, not elsewhere classified: Secondary | ICD-10-CM

## 2016-08-15 DIAGNOSIS — R2689 Other abnormalities of gait and mobility: Secondary | ICD-10-CM

## 2016-08-15 DIAGNOSIS — M25562 Pain in left knee: Secondary | ICD-10-CM

## 2016-08-15 DIAGNOSIS — R262 Difficulty in walking, not elsewhere classified: Secondary | ICD-10-CM

## 2016-08-15 NOTE — Therapy (Signed)
[facility] [place] [address]  [address] [place], [place], [address] Phone: [phone]   Fax:  [phone]  Physical Therapy Treatment  Patient Details  Name: Randy [name] MRN: [id number] Date of Birth: 04/01/1961 Referring Provider: Arsenio LoaderBryson Stilwell PA-C for R. Valma CavaAndrew Collins, MD  Encounter Date: 08/15/2016      PT End of Session - 08/15/16 1533    Visit Number 2   Number of Visits 24   Date for PT Re-Evaluation 11/04/16   Authorization Type Self pay   PT Start Time 1533   PT Stop Time 1620   PT Time Calculation (min) 47 min   Activity Tolerance Patient tolerated treatment well   Behavior During Therapy [facility] for tasks assessed/performed      Past Medical History:  Diagnosis Date  . At risk for sleep apnea    STOP-BANG= 6         SENT TO PCP 06-24-2016  . History of thyroid nodule    s/p  right thyroid lopectomy--  per path report:  follicular adenoma, chronic thyroiditis  . Hyperlipidemia   . Hypertension   . Hypogonadism male   . Hypothyroidism   . Other specified disorders of tendon, right knee    quad tendon tear  . Wears glasses     Past Surgical History:  Procedure Laterality Date  . COLONOSCOPY  2006  . EYE SURGERY Right age 55   removal glass  . HYPOSPADIAS CORRECTION  age 89  . QUADRICEPS TENDON REPAIR Left 06/28/2016   Procedure: LEFT KNEE REPAIR QUADRICEP TENDON;  Surgeon: Eugenia Mcalpineobert Collins, MD;  [facility] [place];  Service: Orthopedics;  Laterality: Left;  . THYROID LOBECTOMY Right 02/03/2005    There were no vitals filed for this visit.      Subjective Assessment - 08/15/16 1537    Subjective Pt reporting no issues with HEP, but noting a "catch"/soreness in R low back recently.   Pertinent History L Quad tendon rupture repair 06/28/16   Patient Stated Goals "Get my knee back to normal"   Currently in Pain? No/denies            [facility] - 08/15/16 1533      Assessment   Next MD Visit 08/24/16                  [facility] Adult PT Treatment/Exercise - 08/15/16 1533      Knee/Hip Exercises: Standing   Hip Abduction Both;10 reps;Knee straight   Abduction Limitations in knee immobilizer, UE support on counter   Hip Extension Both;10 reps;Knee straight   Extension Limitations in knee immobilizer, UE support on counter     Knee/Hip Exercises: Seated   Abduction/Adduction  Left;10 reps   Abd/Adduction Limitations heel on towel     Knee/Hip Exercises: Supine   Quad Sets Left;10 reps  5" hold   Knee Flexion AAROM;Left;15 reps  limited motion   Knee Flexion Limitations heels on peanut ball   Other Supine Knee/Hip Exercises Hip ABD/ADD x10   Other Supine Knee/Hip Exercises Alt quad set/hip extension isometric into peanut ball 15x5"     Knee/Hip Exercises: Sidelying   Hip ABduction Left;10 reps   Hip ADduction Left;10 reps     Knee/Hip Exercises: Prone   Straight Leg Raises Both;10 reps                  PT Short Term Goals - 08/08/16 1458  PT SHORT TERM GOAL #1   Title Independent with initial HEP by 09/09/16   Status New     PT SHORT TERM GOAL #2   Title L knee ROM 0-90 w/o pain by 09/09/16   Status New           PT Long Term Goals - 08/08/16 1458      PT LONG TERM GOAL #1   Title Independent with advanced HEP by 11/05/15   Status New     PT LONG TERM GOAL #2   Title L knee AROM >/= 0-110 by 11/05/15   Status New     PT LONG TERM GOAL #3   Title L hip strength >/= 4+/5 by 11/05/15   Status New     PT LONG TERM GOAL #4   Title L knee strength >/= 4+/5 by 11/05/15   Status New     PT LONG TERM GOAL #5   Title Pt able to perform all normal daily chores, ambulate and negotiate stairs w/o restriction due to L knee LOM, weakness or pain by 11/05/15   Status New               Plan - 08/15/16 1541    Clinical Impression Statement Pt reporting good compliance with HEP but did require correction  of technique with standing exercises to limit trunk motion and isolate hip activation. Added mat level hip SLR in abuction, adduction & extension with good tolerance. Pt to see MD next week and will await guidance therapy prorgession.   Rehab Potential Good   Clinical Impairments Affecting Rehab Potential morbid obesity, HTN   PT Treatment/Interventions Patient/family education;Therapeutic exercise;Neuromuscular re-education;Manual techniques;Scar mobilization;Taping;Therapeutic activities;Functional mobility training;Gait training;Stair training;Electrical Stimulation;Cryotherapy;Vasopneumatic Device;ADLs/Self Care Home Management   PT Next Visit Plan Gently progress L knee ROM; Hip strengthening; Progress to knee strengthening per MD   Consulted and Agree with Plan of Care Patient      Patient will benefit from skilled therapeutic intervention in order to improve the following deficits and impairments:  Decreased range of motion, Impaired flexibility, Decreased strength, Increased fascial restricitons, Decreased scar mobility, Difficulty walking, Abnormal gait, Decreased activity tolerance, Decreased endurance, Pain  Visit Diagnosis: Stiffness of left knee, not elsewhere classified  Difficulty in walking, not elsewhere classified  Other abnormalities of gait and mobility  Acute pain of left knee     Problem List Patient Active Problem List   Diagnosis Date Noted  . S/P knee surgery 06/28/2016  . Pain of right heel 01/01/2013  . RUQ abdominal pain 06/19/2012  . Well adult exam 01/27/2012  . HYPOGONADISM 08/24/2009  . 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 COLONIC POLYPS, HX OF 04/29/2009  . Hypothyroidism 06/24/2007  . Dyslipidemia 06/24/2007  . Essential hypertension 06/24/2007  . OBESITY, MORBID 06/22/2007    Randy [name], PT, MPT 08/15/2016, 4:22 PM  [facility] [place] [address]  [address] [place], [place],  [address] Phone: [phone]   Fax:  [phone]  Name: Randy FaceJohn S [name] MRN: [id number] Date of Birth: 07/24/1961

## 2016-08-16 ENCOUNTER — Ambulatory Visit: Payer: Self-pay | Admitting: Physical Therapy

## 2016-08-18 NOTE — Telephone Encounter (Signed)
Forms scanned into Media Tab 08/12/2016

## 2016-08-22 ENCOUNTER — Ambulatory Visit: Payer: Self-pay | Admitting: Physical Therapy

## 2016-08-22 DIAGNOSIS — M25662 Stiffness of left knee, not elsewhere classified: Secondary | ICD-10-CM

## 2016-08-22 DIAGNOSIS — R2689 Other abnormalities of gait and mobility: Secondary | ICD-10-CM

## 2016-08-22 DIAGNOSIS — M25562 Pain in left knee: Secondary | ICD-10-CM

## 2016-08-22 DIAGNOSIS — R262 Difficulty in walking, not elsewhere classified: Secondary | ICD-10-CM

## 2016-08-22 NOTE — Therapy (Signed)
[facility] [place] [address]  [address] [place], [place], [address] Phone: [phone]   Fax:  [phone]  Physical Therapy Treatment  Patient Details  Name: Randy [name] MRN: [id number] Date of Birth: 02/23/1961 Referring Provider: Arsenio LoaderBryson Stilwell PA-C for R. Valma CavaAndrew Collins, MD  Encounter Date: 08/22/2016      PT End of Session - 08/22/16 1538    Visit Number 3   Number of Visits 24   Date for PT Re-Evaluation 11/04/16   Authorization Type Self pay   PT Start Time 1538   PT Stop Time 1626   PT Time Calculation (min) 48 min   Activity Tolerance Patient tolerated treatment well   Behavior During Therapy [facility] for tasks assessed/performed      Past Medical History:  Diagnosis Date  . At risk for sleep apnea    STOP-BANG= 6         SENT TO PCP 06-24-2016  . History of thyroid nodule    s/p  right thyroid lopectomy--  per path report:  follicular adenoma, chronic thyroiditis  . Hyperlipidemia   . Hypertension   . Hypogonadism male   . Hypothyroidism   . Other specified disorders of tendon, right knee    quad tendon tear  . Wears glasses     Past Surgical History:  Procedure Laterality Date  . COLONOSCOPY  2006  . EYE SURGERY Right age 89   removal glass  . HYPOSPADIAS CORRECTION  age 89  . QUADRICEPS TENDON REPAIR Left 06/28/2016   Procedure: LEFT KNEE REPAIR QUADRICEP TENDON;  Surgeon: Eugenia Mcalpineobert Collins, MD;  [facility] [place];  Service: Orthopedics;  Laterality: Left;  . THYROID LOBECTOMY Right 02/03/2005    There were no vitals filed for this visit.      Subjective Assessment - 08/22/16 1541    Subjective Pt reports he was very paranoid being out in the snow over the weekend. Feels like motion is progressing well.   Pertinent History L Quad tendon rupture repair 06/28/16   Patient Stated Goals "Get my knee back to normal"   Currently in Pain? No/denies            [facility] PT Assessment -  08/22/16 1538      Assessment   Medical Diagnosis L quad tendon repair   Referring Provider Arsenio LoaderBryson Stilwell PA-C for R. Valma CavaAndrew Collins, MD   Onset Date/Surgical Date 06/28/16   Next MD Visit 08/24/16     ROM / Strength   AROM / PROM / Strength PROM     PROM   PROM Assessment Site Knee   Right/Left Knee Left   Left Knee Extension 0   Left Knee Flexion 72                     [facility] Adult PT Treatment/Exercise - 08/22/16 1538      Knee/Hip Exercises: Standing   Hip Flexion Both;15 reps;Knee straight   Hip Flexion Limitations UE support on treadmill rail   Hip Abduction Both;15 reps;Knee straight   Abduction Limitations 2# on L   Hip Extension Both;15 reps;Knee straight   Extension Limitations 2# on L   Functional Squat 10 reps;3 seconds   Functional Squat Limitations 0-45 dg     Knee/Hip Exercises: Seated   Long Arc Quad Left;10 reps   Long Arc Quad Limitations limited to 30 degrees     Knee/Hip Exercises: Supine   [employer] CoQuad Sets Left;20 reps  5" hold   Knee Flexion AAROM;Left;15 reps  limited motion   Knee Flexion Limitations heels on peanut ball   Other Supine Knee/Hip Exercises Alt quad set/hip extension isometric into peanut ball 20x5"     Manual Therapy   Manual Therapy Joint mobilization;Soft tissue mobilization   Joint Mobilization L patellar mobs - all directions   Soft tissue mobilization Scar tissue massage                 PT Education - 08/22/16 1628    Education provided Yes   Education Details HEP update   Person(s) Educated Patient   Methods Explanation;Demonstration;Handout   Comprehension Verbalized understanding;Returned demonstration          PT Short Term Goals - 08/22/16 1628      PT SHORT TERM GOAL #1   Title Independent with initial HEP by 09/09/16   Status Achieved     PT SHORT TERM GOAL #2   Title L knee ROM 0-90 w/o pain by 09/09/16   Status On-going           PT Long Term Goals - 08/22/16 1628      PT  LONG TERM GOAL #1   Title Independent with advanced HEP by 11/05/15   Status On-going     PT LONG TERM GOAL #2   Title L knee AROM >/= 0-110 by 11/05/15   Status On-going     PT LONG TERM GOAL #3   Title L hip strength >/= 4+/5 by 11/05/15   Status On-going     PT LONG TERM GOAL #4   Title L knee strength >/= 4+/5 by 11/05/15   Status On-going     PT LONG TERM GOAL #5   Title Pt able to perform all normal daily chores, ambulate and negotiate stairs w/o restriction due to L knee LOM, weakness or pain by 11/05/15   Status On-going               Plan - 08/22/16 1628    Clinical Impression Statement Initial therapy was conservative per initial orders and lack of protocol available, however protocol received from MD office late last week, therefore gently progressed activities/exercises today with good pt tolerance. Deferred unlocking brace as pt to see MD on Wed, but feel pt has adequate quad activation/control to proceed with unlocking brace. Pt has been currently attending therapy 1x/wk due to self-pay but may need to increase frequency as pt moves into moderate protection phase pending continued progress with ROM and strength.   Rehab Potential Good   Clinical Impairments Affecting Rehab Potential morbid obesity, HTN   PT Treatment/Interventions Patient/family education;Therapeutic exercise;Neuromuscular re-education;Manual techniques;Scar mobilization;Taping;Therapeutic activities;Functional mobility training;Gait training;Stair training;Electrical Stimulation;Cryotherapy;Vasopneumatic Device;ADLs/Self Care Home Management   PT Next Visit Plan Gently progress L knee ROM; Hip strengthening; Progress to knee strengthening per MD   Consulted and Agree with Plan of Care Patient      Patient will benefit from skilled therapeutic intervention in order to improve the following deficits and impairments:  Decreased range of motion, Impaired flexibility, Decreased strength, Increased fascial  restricitons, Decreased scar mobility, Difficulty walking, Abnormal gait, Decreased activity tolerance, Decreased endurance, Pain  Visit Diagnosis: Stiffness of left knee, not elsewhere classified  Difficulty in walking, not elsewhere classified  Other abnormalities of gait and mobility  Acute pain of left knee     Problem List Patient Active Problem List   Diagnosis Date Noted  . S/P knee surgery 06/28/2016  . Pain of  right heel 01/01/2013  . RUQ abdominal pain 06/19/2012  . Well adult exam 01/27/2012  . HYPOGONADISM 08/24/2009  . 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 COLONIC POLYPS, HX OF 04/29/2009  . Hypothyroidism 06/24/2007  . Dyslipidemia 06/24/2007  . Essential hypertension 06/24/2007  . OBESITY, MORBID 06/22/2007    Marry GuanJoAnne M Kreis, PT, MPT 08/22/2016, 4:37 PM  [facility] [place] [address] [place] [address]  [address] [place], [place], [address] Phone: [phone]   Fax:  [phone]  Name: Randy [name] MRN: [id number] Date of Birth: 05/05/1961

## 2016-08-23 ENCOUNTER — Encounter: Payer: Self-pay | Admitting: Physical Therapy

## 2016-08-29 ENCOUNTER — Ambulatory Visit: Payer: Self-pay | Admitting: Physical Therapy

## 2016-08-29 DIAGNOSIS — R262 Difficulty in walking, not elsewhere classified: Secondary | ICD-10-CM

## 2016-08-29 DIAGNOSIS — M25662 Stiffness of left knee, not elsewhere classified: Secondary | ICD-10-CM

## 2016-08-29 DIAGNOSIS — R2689 Other abnormalities of gait and mobility: Secondary | ICD-10-CM

## 2016-08-29 DIAGNOSIS — M25562 Pain in left knee: Secondary | ICD-10-CM

## 2016-08-29 NOTE — Therapy (Signed)
[facility] [place] [address]  [address] [place], [place], [address] Phone: [phone]   Fax:  [phone]  Physical Therapy Treatment  Patient Details  Name: Randy [name] MRN: [id number] Date of Birth: 04/18/1961 Referring Provider: Arsenio LoaderBryson Stilwell PA-C for R. Valma CavaAndrew Collins, MD  Encounter Date: 08/29/2016      PT End of Session - 08/29/16 1614    Visit Number 4   Number of Visits 24   Date for PT Re-Evaluation 11/04/16   Authorization Type Self pay   PT Start Time 1533   PT Stop Time 1613   PT Time Calculation (min) 40 min   Activity Tolerance Patient tolerated treatment well   Behavior During Therapy [facility] for tasks assessed/performed      Past Medical History:  Diagnosis Date  . At risk for sleep apnea    STOP-BANG= 6         SENT TO PCP 06-24-2016  . History of thyroid nodule    s/p  right thyroid lopectomy--  per path report:  follicular adenoma, chronic thyroiditis  . Hyperlipidemia   . Hypertension   . Hypogonadism male   . Hypothyroidism   . Other specified disorders of tendon, right knee    quad tendon tear  . Wears glasses     Past Surgical History:  Procedure Laterality Date  . COLONOSCOPY  2006  . EYE SURGERY Right age 55   removal glass  . HYPOSPADIAS CORRECTION  age 89  . QUADRICEPS TENDON REPAIR Left 06/28/2016   Procedure: LEFT KNEE REPAIR QUADRICEP TENDON;  Surgeon: Eugenia Mcalpineobert Collins, MD;  [facility] [place];  Service: Orthopedics;  Laterality: Left;  . THYROID LOBECTOMY Right 02/03/2005    There were no vitals filed for this visit.      Subjective Assessment - 08/29/16 1546    Subjective patient has had follow-up with MD - unlockede brace to 90 degrees   Pertinent History L Quad tendon rupture repair 06/28/16   Patient Stated Goals "Get my knee back to normal"   Currently in Pain? No/denies   Pain Score 0-No pain            [facility] PT Assessment - 08/29/16 0001       PROM   Left Knee Extension 0   Left Knee Flexion 84                     [facility] Adult PT Treatment/Exercise - 08/29/16 0001      Knee/Hip Exercises: Seated   Long Arc Quad Left;15 reps   Hamstring Curl 15 reps   Hamstring Limitations red tband     Knee/Hip Exercises: Supine   Short Arc Quad Sets Left;15 reps   Heel Slides Left;10 reps   Straight Leg Raises Left;15 reps   Knee Flexion AAROM;Left;15 reps   Knee Flexion Limitations heels on peanut ball                PT Education - 08/29/16 1744    Education provided Yes   Education Details appropriate knee brace application   Person(s) Educated Patient   Methods Explanation;Demonstration   Comprehension Verbalized understanding;Returned demonstration;Need further instruction          PT Short Term Goals - 08/22/16 1628      PT SHORT TERM GOAL #1   Title Independent with initial HEP by 09/09/16   Status Achieved     PT SHORT TERM GOAL #2  Title L knee ROM 0-90 w/o pain by 09/09/16   Status On-going           PT Long Term Goals - 08/22/16 1628      PT LONG TERM GOAL #1   Title Independent with advanced HEP by 11/05/15   Status On-going     PT LONG TERM GOAL #2   Title L knee AROM >/= 0-110 by 11/05/15   Status On-going     PT LONG TERM GOAL #3   Title L hip strength >/= 4+/5 by 11/05/15   Status On-going     PT LONG TERM GOAL #4   Title L knee strength >/= 4+/5 by 11/05/15   Status On-going     PT LONG TERM GOAL #5   Title Pt able to perform all normal daily chores, ambulate and negotiate stairs w/o restriction due to L knee LOM, weakness or pain by 11/05/15   Status On-going               Plan - 08/29/16 1614    Clinical Impression Statement Patient today at 8 weeks post-op. Patient with recent follow up to MD, which is now allowing patient to ambulate with brace unlocked to 90 degrees. Patient today incorporating more strengthening of L quadriceps musculature, per MD  protocol, with good quad activation and control. Patient today with continued tightness of L quad mm with tolerance to all passive stretching. Much time spent with patient today regarding correct application of hinged knee brace as patient presented to PT with brace much below joint line. Patient to continue to benefit from PT to maximize function.    Clinical Impairments Affecting Rehab Potential morbid obesity, HTN   PT Treatment/Interventions Patient/family education;Therapeutic exercise;Neuromuscular re-education;Manual techniques;Scar mobilization;Taping;Therapeutic activities;Functional mobility training;Gait training;Stair training;Electrical Stimulation;Cryotherapy;Vasopneumatic Device;ADLs/Self Care Home Management   PT Next Visit Plan Gently progress L knee ROM; Hip strengthening; Progress to knee strengthening per MD   Consulted and Agree with Plan of Care Patient      Patient will benefit from skilled therapeutic intervention in order to improve the following deficits and impairments:  Decreased range of motion, Impaired flexibility, Decreased strength, Increased fascial restricitons, Decreased scar mobility, Difficulty walking, Abnormal gait, Decreased activity tolerance, Decreased endurance, Pain  Visit Diagnosis: Stiffness of left knee, not elsewhere classified  Difficulty in walking, not elsewhere classified  Other abnormalities of gait and mobility  Acute pain of left knee     Problem List Patient Active Problem List   Diagnosis Date Noted  . S/P knee surgery 06/28/2016  . Pain of right heel 01/01/2013  . RUQ abdominal pain 06/19/2012  . Well adult exam 01/27/2012  . HYPOGONADISM 08/24/2009  . 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 COLONIC POLYPS, HX OF 04/29/2009  . Hypothyroidism 06/24/2007  . Dyslipidemia 06/24/2007  . Essential hypertension 06/24/2007  . OBESITY, MORBID 06/22/2007     Kipp LaurenceStephanie R Nimco Bivens, PT, DPT 08/29/16 5:49 PM   [facility] [place] [address]  [address] [place], [place], [address] Phone: [phone]   Fax:  [phone]  Name: Randy [name] MRN: [id number] Date of Birth: 02/25/1961

## 2016-08-29 NOTE — Patient Instructions (Signed)
Straight Leg Raise    Tighten stomach and slowly raise locked left leg __5__ inches from floor. Repeat _10-15___ times per set.    Long [employer] Quad    Straighten operated leg.  Repeat ___10-15_ times.

## 2016-08-30 ENCOUNTER — Encounter: Payer: Self-pay | Admitting: Physical Therapy

## 2016-09-06 ENCOUNTER — Ambulatory Visit: Payer: Self-pay | Admitting: Physical Therapy

## 2016-09-06 DIAGNOSIS — M25562 Pain in left knee: Secondary | ICD-10-CM

## 2016-09-06 DIAGNOSIS — R2689 Other abnormalities of gait and mobility: Secondary | ICD-10-CM

## 2016-09-06 DIAGNOSIS — R262 Difficulty in walking, not elsewhere classified: Secondary | ICD-10-CM

## 2016-09-06 DIAGNOSIS — M25662 Stiffness of left knee, not elsewhere classified: Secondary | ICD-10-CM

## 2016-09-06 NOTE — Therapy (Signed)
[facility] [address]  [address] [place], [place], [address] Phone: [phone]   Fax:  [phone]  Physical Therapy Treatment  Patient Details  Name: Randy [name] MRN: [id number] Date of Birth: 12/26/1960 Referring Provider: Arsenio LoaderBryson Stilwell PA-C for R. Valma CavaAndrew Collins, MD  Encounter Date: 09/06/2016      PT End of Session - 09/06/16 1509    Visit Number 5   Number of Visits 24   Date for PT Re-Evaluation 11/04/16   Authorization Type Self pay   PT Start Time 1500  pt late   PT Stop Time 1532   PT Time Calculation (min) 32 min   Activity Tolerance Patient tolerated treatment well   Behavior [facility] for tasks assessed/performed      Past Medical History:  Diagnosis Date  . At risk for sleep apnea    STOP-BANG= 6         SENT TO PCP 06-24-2016  . History of thyroid nodule    s/p  right thyroid lopectomy--  per path report:  follicular adenoma, chronic thyroiditis  . Hyperlipidemia   . Hypertension   . Hypogonadism male   . Hypothyroidism   . Other specified disorders of tendon, right knee    quad tendon tear  . Wears glasses     Past Surgical History:  Procedure Laterality Date  . COLONOSCOPY  2006  . EYE SURGERY Right age 55   removal glass  . HYPOSPADIAS CORRECTION  age 89  . QUADRICEPS TENDON REPAIR Left 06/28/2016   Procedure: LEFT KNEE REPAIR QUADRICEP TENDON;  Surgeon: Eugenia Mcalpineobert Collins, MD;  [facility];  Service: Orthopedics;  Laterality: Left;  . THYROID LOBECTOMY Right 02/03/2005    There were no vitals filed for this visit.      Subjective Assessment - 09/06/16 1510    Subjective patient feeling well - had some swelling and soreness after last session - resolved after 3 days - attributes to increasing activity with therapy   Pertinent History L Quad tendon rupture repair 06/28/16   Patient Stated Goals "Get my knee back to normal"   Currently in Pain?  No/denies   Pain Score 0-No pain                         [facility] Adult PT Treatment/Exercise - 09/06/16 1510      Knee/Hip Exercises: Aerobic   Nustep level 4 x 5 minutes     Knee/Hip Exercises: Standing   Heel Raises Both;15 reps   Functional Squat 15 reps   Functional Squat Limitations 0 to approx 50/60 degrees     Knee/Hip Exercises: Seated   Long Arc Quad Strengthening;Left;15 reps;Weights   Long Arc Quad Weight 2 lbs.   Hamstring Curl 15 reps   Hamstring Limitations red tband     Knee/Hip Exercises: Supine   Short Arc Quad Sets Strengthening;Left;15 reps   Short Arc Quad Sets Limitations 2#   Straight Leg Raises Strengthening;Left;2 sets;15 reps   Knee Flexion AAROM;Left;15 reps   Knee Flexion Limitations heels on peanut ball - 5 second hold                   PT Short Term Goals - 08/22/16 1628      PT SHORT TERM GOAL #1   Title Independent with initial HEP by 09/09/16   Status Achieved     PT SHORT TERM GOAL #2  Title L knee ROM 0-90 w/o pain by 09/09/16   Status On-going           PT Long Term Goals - 08/22/16 1628      PT LONG TERM GOAL #1   Title Independent with advanced HEP by 11/05/15   Status On-going     PT LONG TERM GOAL #2   Title L knee AROM >/= 0-110 by 11/05/15   Status On-going     PT LONG TERM GOAL #3   Title L hip strength >/= 4+/5 by 11/05/15   Status On-going     PT LONG TERM GOAL #4   Title L knee strength >/= 4+/5 by 11/05/15   Status On-going     PT LONG TERM GOAL #5   Title Pt able to perform all normal daily chores, ambulate and negotiate stairs w/o restriction due to L knee LOM, weakness or pain by 11/05/15   Status On-going               Plan - 09/06/16 1509    Clinical Impression Statement PT session limited today due to paitnet being late. Patient with some concerns regarding some swelling after last treatment session - discussion regarding increase in activity and overall strengthening  may cause an increase in swelling and advised on continued icing. Patient able to perform all tasks within session with no increase in pain; only appropriate muscle fatigue. Patient to continue to benefit from PT to maximize function.    Clinical Impairments Affecting Rehab Potential morbid obesity, HTN   PT Treatment/Interventions Patient/family education;Therapeutic exercise;Neuromuscular re-education;Manual techniques;Scar mobilization;Taping;Therapeutic activities;Functional mobility training;Gait training;Stair training;Electrical Stimulation;Cryotherapy;Vasopneumatic Device;ADLs/Self Care Home Management   PT Next Visit Plan Gently progress L knee ROM; Hip strengthening; Progress to knee strengthening per MD   Consulted and Agree with Plan of Care Patient      Patient will benefit from skilled therapeutic intervention in order to improve the following deficits and impairments:  Decreased range of motion, Impaired flexibility, Decreased strength, Increased fascial restricitons, Decreased scar mobility, Difficulty walking, Abnormal gait, Decreased activity tolerance, Decreased endurance, Pain  Visit Diagnosis: Stiffness of left knee, not elsewhere classified  Difficulty in walking, not elsewhere classified  Other abnormalities of gait and mobility  Acute pain of left knee     Problem List Patient Active Problem List   Diagnosis Date Noted  . S/P knee surgery 06/28/2016  . Pain of right heel 01/01/2013  . RUQ abdominal pain 06/19/2012  . Well adult exam 01/27/2012  . HYPOGONADISM 08/24/2009  . 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 COLONIC POLYPS, HX OF 04/29/2009  . Hypothyroidism 06/24/2007  . Dyslipidemia 06/24/2007  . Essential hypertension 06/24/2007  . OBESITY, MORBID 06/22/2007     Kipp LaurenceStephanie R Aaron, PT, DPT 09/06/16 4:26 PM   [facility] [address]  [address] [place], [place], [address] Phone:  [phone]   Fax:  [phone]  Name: Randy [name] MRN: [id number] Date of Birth: 09/29/1960

## 2016-09-08 ENCOUNTER — Ambulatory Visit: Payer: Self-pay | Admitting: Physical Therapy

## 2016-09-09 ENCOUNTER — Ambulatory Visit: Payer: Self-pay | Admitting: Physical Therapy

## 2016-09-09 DIAGNOSIS — M25562 Pain in left knee: Secondary | ICD-10-CM

## 2016-09-09 DIAGNOSIS — R262 Difficulty in walking, not elsewhere classified: Secondary | ICD-10-CM

## 2016-09-09 DIAGNOSIS — M25662 Stiffness of left knee, not elsewhere classified: Secondary | ICD-10-CM

## 2016-09-09 DIAGNOSIS — R2689 Other abnormalities of gait and mobility: Secondary | ICD-10-CM

## 2016-09-09 NOTE — Therapy (Signed)
[facility] [address]  [address] [place], [place], [address] Phone: [phone]   Fax:  [phone]  Physical Therapy Treatment  Patient Details  Name: Randy [name] MRN: [id number] Date of Birth: 21-Apr-1961 Referring Provider: Arsenio Loader PA-C for R. Valma Cava, MD  Encounter Date: 09/09/2016      PT End of Session - 09/09/16 0932    Visit Number 6   Number of Visits 24   Date for PT Re-Evaluation 11/04/16   Authorization Type Self pay   PT Start Time 0932   PT Stop Time 1019   PT Time Calculation (min) 47 min   Activity Tolerance Patient tolerated treatment well   Behavior During Therapy [facility] for tasks assessed/performed      Past Medical History:  Diagnosis Date  . At risk for sleep apnea    STOP-BANG= 6         SENT TO PCP 06-24-2016  . History of thyroid nodule    s/p  right thyroid lopectomy--  per path report:  follicular adenoma, chronic thyroiditis  . Hyperlipidemia   . Hypertension   . Hypogonadism male   . Hypothyroidism   . Other specified disorders of tendon, right knee    quad tendon tear  . Wears glasses     Past Surgical History:  Procedure Laterality Date  . COLONOSCOPY  2006  . EYE SURGERY Right age 1   removal glass  . HYPOSPADIAS CORRECTION  age 66  . QUADRICEPS TENDON REPAIR Left 06/28/2016   Procedure: LEFT KNEE REPAIR QUADRICEP TENDON;  Surgeon: Eugenia Mcalpine, MD;  [facility];  Service: Orthopedics;  Laterality: Left;  . THYROID LOBECTOMY Right 02/03/2005    There were no vitals filed for this visit.      Subjective Assessment - 09/09/16 0937    Subjective No further swelling reported but continues to note some tighness in the knee.   Pertinent History L Quad tendon rupture repair 06/28/16   Patient Stated Goals "Get my knee back to normal"   Currently in Pain? No/denies            [facility] PT Assessment - 09/09/16 0932      Assessment    Next MD Visit 09/26/16     AROM   Left Knee Extension 5   Left Knee Flexion 97                     [facility] Adult PT Treatment/Exercise - 09/09/16 0932      Ambulation/Gait   Ambulation/Gait Assistance 5: Supervision   Ambulation/Gait Assistance Details VC's to increase L hip/knee flexion (avoid locking knee in extension) and promoted heel strike to normalize gait pattern   Ambulation Distance (Feet) 180 Feet   Assistive device None   Gait Pattern Step-through pattern;Decreased hip/knee flexion - left;Wide base of support   Ambulation Surface Level;Indoor   Gait Comments Hip & knee flexion initially exagerated after cues but able to transition into normal gait pattern with increased distance     Knee/Hip Exercises: Aerobic   Nustep lvl 5 x 6'     Knee/Hip Exercises: Standing   Hip Flexion Both;10 reps;Knee straight   Hip Flexion Limitations red TB, UE support on back of chair   Hip ADduction Both;10 reps   Hip ADduction Limitations red TB, UE support on back of chair   Hip Abduction Both;10 reps;Knee straight   Abduction Limitations red TB, UE support  on back of chair   Hip Extension Both;10 reps;Knee straight   Extension Limitations red TB, UE support on back of chair   Functional Squat 10 reps;2 sets   Functional Squat Limitations TRX to ~60 dg; 2nd set with heel raises on return to standing     Knee/Hip Exercises: Supine   Knee Flexion AAROM;Left;20 reps   Knee Flexion Limitations heels on peanut ball - 5 second hold                 PT Education - 09/09/16 1015    Education provided Yes   Education Details HEP update - standing 4 way SLR with red TB   Person(s) Educated Patient   Methods Explanation;Demonstration;Handout   Comprehension Verbalized understanding;Returned demonstration          PT Short Term Goals - 09/09/16 0940      PT SHORT TERM GOAL #1   Title Independent with initial HEP by 09/09/16   Status Achieved     PT SHORT TERM  GOAL #2   Title L knee ROM 0-90 w/o pain by 09/09/16   Status Achieved           PT Long Term Goals - 08/22/16 1628      PT LONG TERM GOAL #1   Title Independent with advanced HEP by 11/05/15   Status On-going     PT LONG TERM GOAL #2   Title L knee AROM >/= 0-110 by 11/05/15   Status On-going     PT LONG TERM GOAL #3   Title L hip strength >/= 4+/5 by 11/05/15   Status On-going     PT LONG TERM GOAL #4   Title L knee strength >/= 4+/5 by 11/05/15   Status On-going     PT LONG TERM GOAL #5   Title Pt able to perform all normal daily chores, ambulate and negotiate stairs w/o restriction due to L knee LOM, weakness or pain by 11/05/15   Status On-going               Plan - 09/09/16 0940    Clinical Impression Statement Pt reports tendency to continue to walk with knee held stiff despite brace being unlocked, therefore reviewed normal gait pattern with pt able to demonstrate appropriate pattern after instruction. Progressed SLS stability and proprioception with addition of red TB to B standing 4 way SLR, with these exercises added to HEP. ROM progressing well with current R knee AROM 5-97.   Clinical Impairments Affecting Rehab Potential morbid obesity, HTN   PT Treatment/Interventions Patient/family education;Therapeutic exercise;Neuromuscular re-education;Manual techniques;Scar mobilization;Taping;Therapeutic activities;Functional mobility training;Gait training;Stair training;Electrical Stimulation;Cryotherapy;Vasopneumatic Device;ADLs/Self Care Home Management   PT Next Visit Plan progress L knee ROM & strengthening per quad tendon repair protocol   Consulted and Agree with Plan of Care Patient      Patient will benefit from skilled therapeutic intervention in order to improve the following deficits and impairments:  Decreased range of motion, Impaired flexibility, Decreased strength, Increased fascial restricitons, Decreased scar mobility, Difficulty walking, Abnormal  gait, Decreased activity tolerance, Decreased endurance, Pain  Visit Diagnosis: Stiffness of left knee, not elsewhere classified  Difficulty in walking, not elsewhere classified  Other abnormalities of gait and mobility  Acute pain of left knee     Problem List Patient Active Problem List   Diagnosis Date Noted  . S/P knee surgery 06/28/2016  . Pain of right heel 01/01/2013  . RUQ abdominal pain 06/19/2012  . Well adult exam  01/27/2012  . HYPOGONADISM 08/24/2009  . 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 COLONIC POLYPS, HX OF 04/29/2009  . Hypothyroidism 06/24/2007  . Dyslipidemia 06/24/2007  . Essential hypertension 06/24/2007  . OBESITY, MORBID 06/22/2007    Marry GuanJoAnne M Kreis, PT, MPT 09/09/2016, 12:44 PM  [facility] [address]  [address] [place], [place], [address] Phone: [phone]   Fax:  [phone]  Name: Randy FaceJohn S [name] MRN: [id number] Date of Birth: 02/10/1961

## 2016-09-13 ENCOUNTER — Ambulatory Visit: Payer: Self-pay | Attending: Orthopedic Surgery | Admitting: Physical Therapy

## 2016-09-13 DIAGNOSIS — M25562 Pain in left knee: Secondary | ICD-10-CM | POA: Insufficient documentation

## 2016-09-13 DIAGNOSIS — M25662 Stiffness of left knee, not elsewhere classified: Secondary | ICD-10-CM | POA: Insufficient documentation

## 2016-09-13 DIAGNOSIS — R262 Difficulty in walking, not elsewhere classified: Secondary | ICD-10-CM | POA: Insufficient documentation

## 2016-09-13 DIAGNOSIS — R2689 Other abnormalities of gait and mobility: Secondary | ICD-10-CM | POA: Insufficient documentation

## 2016-09-13 NOTE — Therapy (Signed)
[facility] [address]  [address] [place], [place], [address] Phone: [phone]   Fax:  [phone]  Physical Therapy Treatment  Patient Details  Name: DAYN BARICH MRN: [id number] Date of Birth: 09-Jan-1961 Referring Provider: Arsenio Loader PA-C for R. Valma Cava, MD  Encounter Date: 09/13/2016      PT End of Session - 09/13/16 1446    Visit Number 7   Number of Visits 24   Date for PT Re-Evaluation 11/04/16   Authorization Type Self pay   PT Start Time 1446   PT Stop Time 1525   PT Time Calculation (min) 39 min   Activity Tolerance Patient tolerated treatment well   Behavior During Therapy [facility] for tasks assessed/performed      Past Medical History:  Diagnosis Date  . At risk for sleep apnea    STOP-BANG= 6         SENT TO PCP 06-24-2016  . History of thyroid nodule    s/p  right thyroid lopectomy--  per path report:  follicular adenoma, chronic thyroiditis  . Hyperlipidemia   . Hypertension   . Hypogonadism male   . Hypothyroidism   . Other specified disorders of tendon, right knee    quad tendon tear  . Wears glasses     Past Surgical History:  Procedure Laterality Date  . COLONOSCOPY  2006  . EYE SURGERY Right age 56   removal glass  . HYPOSPADIAS CORRECTION  age 56  . QUADRICEPS TENDON REPAIR Left 06/28/2016   Procedure: LEFT KNEE REPAIR QUADRICEP TENDON;  Surgeon: Eugenia Mcalpine, MD;  Location: [facility];  Service: Orthopedics;  Laterality: Left;  . THYROID LOBECTOMY Right 02/03/2005    There were no vitals filed for this visit.      Subjective Assessment - 09/13/16 1449    Subjective Pt reports cues to target heel strike with gait have helped him feel like he is able to walk more normally.   Pertinent History L Quad tendon rupture repair 06/28/16   Patient Stated Goals "Get my knee back to normal"   Currently in Pain? No/denies                          [facility] Adult PT Treatment/Exercise - 09/13/16 1446      Knee/Hip Exercises: Aerobic   Nustep lvl 5 x 7'     Knee/Hip Exercises: Standing   Heel Raises Both;20 reps;3 seconds   Forward Lunges Both;10 reps;2 seconds   Forward Lunges Limitations UE support on counter   Side Lunges Both;10 reps;2 seconds   Side Lunges Limitations UE support on counter   Wall Squat 10 reps;3 seconds   Wall Squat Limitations 1/3-1/2 squat   Other Standing Knee Exercises Weight shift on Airex pad x20 each - lateral, heel-toe, B staggered stance, marching, minisquat     Knee/Hip Exercises: Seated   Long Arc Quad Strengthening;Left;15 reps;Weights   Long Arc Quad Weight 3 lbs.   Other Seated Knee/Hip Exercises Fitter Leg Press (1 black/1 blue) x15   Hamstring Curl Strengthening;Left;15 reps   Hamstring Limitations green TB                  PT Short Term Goals - 09/09/16 0940      PT SHORT TERM GOAL #1   Title Independent with initial HEP by 09/09/16   Status Achieved     PT SHORT TERM GOAL #  2   Title L knee ROM 0-90 w/o pain by 09/09/16   Status Achieved           PT Long Term Goals - 08/22/16 1628      PT LONG TERM GOAL #1   Title Independent with advanced HEP by 11/05/15   Status On-going     PT LONG TERM GOAL #2   Title L knee AROM >/= 0-110 by 11/05/15   Status On-going     PT LONG TERM GOAL #3   Title L hip strength >/= 4+/5 by 11/05/15   Status On-going     PT LONG TERM GOAL #4   Title L knee strength >/= 4+/5 by 11/05/15   Status On-going     PT LONG TERM GOAL #5   Title Pt able to perform all normal daily chores, ambulate and negotiate stairs w/o restriction due to L knee LOM, weakness or pain by 11/05/15   Status On-going               Plan - 09/13/16 1544    Clinical Impression Statement Pt reporting more comfort with normalized gait pattern after cueing for heel strike last visit. Progressed strengthening and proprioceptive training with pt noting feeling  of apprehension with lunge activities and wall squats but otherwise tolerated well.   Rehab Potential Good   Clinical Impairments Affecting Rehab Potential morbid obesity, HTN   PT Treatment/Interventions Patient/family education;Therapeutic exercise;Neuromuscular re-education;Manual techniques;Scar mobilization;Taping;Therapeutic activities;Functional mobility training;Gait training;Stair training;Electrical Stimulation;Cryotherapy;Vasopneumatic Device;ADLs/Self Care Home Management   PT Next Visit Plan progress L knee ROM & strengthening per quad tendon repair protocol   Consulted and Agree with Plan of Care Patient      Patient will benefit from skilled therapeutic intervention in order to improve the following deficits and impairments:  Decreased range of motion, Impaired flexibility, Decreased strength, Increased fascial restricitons, Decreased scar mobility, Difficulty walking, Abnormal gait, Decreased activity tolerance, Decreased endurance, Pain  Visit Diagnosis: Stiffness of left knee, not elsewhere classified  Difficulty in walking, not elsewhere classified  Other abnormalities of gait and mobility  Acute pain of left knee     Problem List Patient Active Problem List   Diagnosis Date Noted  . S/P knee surgery 06/28/2016  . Pain of right heel 01/01/2013  . RUQ abdominal pain 06/19/2012  . Well adult exam 01/27/2012  . HYPOGONADISM 08/24/2009  . 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 COLONIC POLYPS, HX OF 04/29/2009  . Hypothyroidism 06/24/2007  . Dyslipidemia 06/24/2007  . Essential hypertension 06/24/2007  . OBESITY, MORBID 06/22/2007    Marry GuanJoAnne M Jionni Helming, PT, MPT 09/13/2016, 3:55 PM  [facility] [place] [address]  [address] [place], [place], [address] Phone: [phone]   Fax:  [phone]  Name: Verdell FaceJohn S Hatchell MRN: [id number] Date of Birth: 12/06/1960

## 2016-09-15 ENCOUNTER — Ambulatory Visit: Payer: Self-pay | Admitting: Physical Therapy

## 2016-09-15 DIAGNOSIS — R2689 Other abnormalities of gait and mobility: Secondary | ICD-10-CM

## 2016-09-15 DIAGNOSIS — M25662 Stiffness of left knee, not elsewhere classified: Secondary | ICD-10-CM

## 2016-09-15 DIAGNOSIS — M25562 Pain in left knee: Secondary | ICD-10-CM

## 2016-09-15 DIAGNOSIS — R262 Difficulty in walking, not elsewhere classified: Secondary | ICD-10-CM

## 2016-09-15 NOTE — Therapy (Signed)
[facility] [address]  [address] [place], [place], [address] Phone: [phone]   Fax:  [phone]  Physical Therapy Treatment  Patient Details  Name: Randy [name] MRN: [id number] Date of Birth: February 23, 1961 Referring Provider: Arsenio Loader PA-C for R. Valma Cava, MD  Encounter Date: 09/15/2016      PT End of Session - 09/15/16 1453    Visit Number 8   Number of Visits 24   Date for PT Re-Evaluation 11/04/16   Authorization Type Self pay   PT Start Time 1453  pt arrived late   PT Stop Time 1530   PT Time Calculation (min) 37 min   Activity Tolerance Patient tolerated treatment well   Behavior During Therapy [facility] for tasks assessed/performed      Past Medical History:  Diagnosis Date  . At risk for sleep apnea    STOP-BANG= 6         SENT TO PCP 06-24-2016  . History of thyroid nodule    s/p  right thyroid lopectomy--  per path report:  follicular adenoma, chronic thyroiditis  . Hyperlipidemia   . Hypertension   . Hypogonadism male   . Hypothyroidism   . Other specified disorders of tendon, right knee    quad tendon tear  . Wears glasses     Past Surgical History:  Procedure Laterality Date  . COLONOSCOPY  2006  . EYE SURGERY Right age 33   removal glass  . HYPOSPADIAS CORRECTION  age 49  . QUADRICEPS TENDON REPAIR Left 06/28/2016   Procedure: LEFT KNEE REPAIR QUADRICEP TENDON;  Surgeon: Eugenia Mcalpine, MD;  Location: [facility];  Service: Orthopedics;  Laterality: Left;  . THYROID LOBECTOMY Right 02/03/2005    There were no vitals filed for this visit.      Subjective Assessment - 09/15/16 1456    Subjective Pt continues to deny pain, reporting only tightness.   Pertinent History L Quad tendon rupture repair 06/28/16   Patient Stated Goals "Get my knee back to normal"   Currently in Pain? No/denies                         [facility] Adult PT Treatment/Exercise -  09/15/16 1453      Knee/Hip Exercises: Aerobic   Nustep lvl 6 x 6'     Knee/Hip Exercises: Standing   Hip Flexion Both;15 reps;Knee straight   Hip Flexion Limitations red TB, stationary LE on blue foam oval, 2 pole A   Hip ADduction Both;15 reps   Hip ADduction Limitations red TB, stationary LE on blue foam oval, 2 pole A   Hip Abduction Both;15 reps;Knee straight   Abduction Limitations red TB, stationary LE on blue foam oval, 2 pole A   Hip Extension Both;15 reps;Knee straight   Extension Limitations red TB, stationary LE on blue foam oval, 2 pole A   Functional Squat 10 reps;2 sets;3 seconds   Functional Squat Limitations TRX - 1/2 squat; 2nd set with heel raises on return to standing   Other Standing Knee Exercises L LE rotational stability with red TB x15 med/lat (R TTWB for balance)                  PT Short Term Goals - 09/09/16 0940      PT SHORT TERM GOAL #1   Title Independent with initial HEP by 09/09/16   Status Achieved  PT SHORT TERM GOAL #2   Title L knee ROM 0-90 w/o pain by 09/09/16   Status Achieved           PT Long Term Goals - 08/22/16 1628      PT LONG TERM GOAL #1   Title Independent with advanced HEP by 11/05/15   Status On-going     PT LONG TERM GOAL #2   Title L knee AROM >/= 0-110 by 11/05/15   Status On-going     PT LONG TERM GOAL #3   Title L hip strength >/= 4+/5 by 11/05/15   Status On-going     PT LONG TERM GOAL #4   Title L knee strength >/= 4+/5 by 11/05/15   Status On-going     PT LONG TERM GOAL #5   Title Pt able to perform all normal daily chores, ambulate and negotiate stairs w/o restriction due to L knee LOM, weakness or pain by 11/05/15   Status On-going               Plan - 09/15/16 1457    Clinical Impression Statement Continued proprioceptive training progression with addition of complaint surfaces for stationary LE during standing 4 way SLR and introduction of rotational stabilization. Pt continues  to require frequent cues for proper technique, esp upright posture/trunk position.   Rehab Potential Good   Clinical Impairments Affecting Rehab Potential morbid obesity, HTN   PT Treatment/Interventions Patient/family education;Therapeutic exercise;Neuromuscular re-education;Manual techniques;Scar mobilization;Taping;Therapeutic activities;Functional mobility training;Gait training;Stair training;Electrical Stimulation;Cryotherapy;Vasopneumatic Device;ADLs/Self Care Home Management   PT Next Visit Plan progress L knee ROM & strengthening per quad tendon repair protocol   Consulted and Agree with Plan of Care Patient      Patient will benefit from skilled therapeutic intervention in order to improve the following deficits and impairments:  Decreased range of motion, Impaired flexibility, Decreased strength, Increased fascial restricitons, Decreased scar mobility, Difficulty walking, Abnormal gait, Decreased activity tolerance, Decreased endurance, Pain  Visit Diagnosis: Stiffness of left knee, not elsewhere classified  Difficulty in walking, not elsewhere classified  Other abnormalities of gait and mobility  Acute pain of left knee     Problem List Patient Active Problem List   Diagnosis Date Noted  . S/P knee surgery 06/28/2016  . Pain of right heel 01/01/2013  . RUQ abdominal pain 06/19/2012  . Well adult exam 01/27/2012  . HYPOGONADISM 08/24/2009  . 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 COLONIC POLYPS, HX OF 04/29/2009  . Hypothyroidism 06/24/2007  . Dyslipidemia 06/24/2007  . Essential hypertension 06/24/2007  . OBESITY, MORBID 06/22/2007    Marry GuanJoAnne M Lisha Vitale, PT, MPT 09/15/2016, 3:38 PM  [facility] [place] [address]  [address] [place], [place], [address] Phone: [phone]   Fax:  [phone]  Name: Randy FaceJohn S [name] MRN: [id number] Date of Birth: 04/15/1961

## 2016-09-19 ENCOUNTER — Ambulatory Visit: Payer: Self-pay | Admitting: Physical Therapy

## 2016-09-19 DIAGNOSIS — R262 Difficulty in walking, not elsewhere classified: Secondary | ICD-10-CM

## 2016-09-19 DIAGNOSIS — R2689 Other abnormalities of gait and mobility: Secondary | ICD-10-CM

## 2016-09-19 DIAGNOSIS — M25662 Stiffness of left knee, not elsewhere classified: Secondary | ICD-10-CM

## 2016-09-19 DIAGNOSIS — M25562 Pain in left knee: Secondary | ICD-10-CM

## 2016-09-19 NOTE — Therapy (Signed)
[facility] [place] [address]  [address] [place], [place], [address] Phone: [phone]   Fax:  [phone]  Physical Therapy Treatment  Patient Details  Name: Randy [name] MRN: [id number] Date of Birth: 08/14/1961 Referring Provider: Arsenio LoaderBryson Stilwell PA-C for R. Valma CavaAndrew Collins, MD  Encounter Date: 09/19/2016      PT End of Session - 09/19/16 1206    Visit Number 9   Number of Visits 24   Date for PT Re-Evaluation 11/04/16   Authorization Type Self pay   PT Start Time 1110   PT Stop Time 1150   PT Time Calculation (min) 40 min   Activity Tolerance Patient tolerated treatment well   Behavior During Therapy [facility] for tasks assessed/performed      Past Medical History:  Diagnosis Date  . At risk for sleep apnea    STOP-BANG= 6         SENT TO PCP 06-24-2016  . History of thyroid nodule    s/p  right thyroid lopectomy--  per path report:  follicular adenoma, chronic thyroiditis  . Hyperlipidemia   . Hypertension   . Hypogonadism male   . Hypothyroidism   . Other specified disorders of tendon, right knee    quad tendon tear  . Wears glasses     Past Surgical History:  Procedure Laterality Date  . COLONOSCOPY  2006  . EYE SURGERY Right age 56   removal glass  . HYPOSPADIAS CORRECTION  age 89  . QUADRICEPS TENDON REPAIR Left 06/28/2016   Procedure: LEFT KNEE REPAIR QUADRICEP TENDON;  Surgeon: Eugenia Mcalpineobert Collins, MD;  [facility];  Service: Orthopedics;  Laterality: Left;  . THYROID LOBECTOMY Right 02/03/2005    There were no vitals filed for this visit.      Subjective Assessment - 09/19/16 1111    Subjective pt states knee is feeling about the same as last time. feels a little tight but no pain.   Pertinent History L Quad tendon rupture repair 06/28/16   Patient Stated Goals "Get my knee back to normal"   Currently in Pain? No/denies                         [facility] Adult PT  Treatment/Exercise - 09/19/16 1113      Knee/Hip Exercises: Aerobic   Nustep lvl 6 x 6'     Knee/Hip Exercises: Machines for Strengthening   Cybex Knee Flexion Hamstring Curls: concentric- both legs; eccentric- Left Leg; 10 reps; 1st set 20#, 2nd set 25#     Knee/Hip Exercises: Standing   Forward Lunges Both;10 reps;2 seconds   Forward Lunges Limitations UE support at counter   Side Lunges Both;10 reps;2 seconds   Side Lunges Limitations UE support at counter; keep legs abducted   Functional Squat 10 reps;2 sets;3 seconds   Functional Squat Limitations TRX- 1/2 squat with heel raises     Knee/Hip Exercises: Seated   Other Seated Knee/Hip Exercises Fitter Leg Press; 15 reps (1 black/1 blue)   Hamstring Limitations --     Knee/Hip Exercises: Supine   Bridges Limitations 10 reps; double leg bridges   Knee Flexion AAROM;Left;20 reps   Knee Flexion Limitations heels on peanut ball- hold 5 sec                  PT Short Term Goals - 09/09/16 0940      PT SHORT TERM GOAL #1  Title Independent with initial HEP by 09/09/16   Status Achieved     PT SHORT TERM GOAL #2   Title L knee ROM 0-90 w/o pain by 09/09/16   Status Achieved           PT Long Term Goals - 08/22/16 1628      PT LONG TERM GOAL #1   Title Independent with advanced HEP by 11/05/15   Status On-going     PT LONG TERM GOAL #2   Title L knee AROM >/= 0-110 by 11/05/15   Status On-going     PT LONG TERM GOAL #3   Title L hip strength >/= 4+/5 by 11/05/15   Status On-going     PT LONG TERM GOAL #4   Title L knee strength >/= 4+/5 by 11/05/15   Status On-going     PT LONG TERM GOAL #5   Title Pt able to perform all normal daily chores, ambulate and negotiate stairs w/o restriction due to L knee LOM, weakness or pain by 11/05/15   Status On-going               Plan - 09/19/16 1200    Clinical Impression Statement Continued with strength training of the hip and knee. Progressed hamstring  curls to machine training. pt continues to need verbal cues with lunges to remain in upright posture. Tried visual cuing with mirror which appeared to help the patient with correct posture. Introduced double leg bridges today, pt tolerated well and needed minimal cuing.   Rehab Potential Good   Clinical Impairments Affecting Rehab Potential morbid obesity, HTN   PT Frequency 2x / week   PT Duration 12 weeks   PT Treatment/Interventions Patient/family education;Therapeutic exercise;Neuromuscular re-education;Manual techniques;Scar mobilization;Taping;Therapeutic activities;Functional mobility training;Gait training;Stair training;Electrical Stimulation;Cryotherapy;Vasopneumatic Device;ADLs/Self Care Home Management   PT Next Visit Plan continue with progression of L knee Rom & strengthening per quad tendon repair protocol   Consulted and Agree with Plan of Care Patient      Patient will benefit from skilled therapeutic intervention in order to improve the following deficits and impairments:  Decreased range of motion, Impaired flexibility, Decreased strength, Increased fascial restricitons, Decreased scar mobility, Difficulty walking, Abnormal gait, Decreased activity tolerance, Decreased endurance, Pain  Visit Diagnosis: Stiffness of left knee, not elsewhere classified  Difficulty in walking, not elsewhere classified  Other abnormalities of gait and mobility  Acute pain of left knee     Problem List Patient Active Problem List   Diagnosis Date Noted  . S/P knee surgery 06/28/2016  . Pain of right heel 01/01/2013  . RUQ abdominal pain 06/19/2012  . Well adult exam 01/27/2012  . HYPOGONADISM 08/24/2009  . 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 COLONIC POLYPS, HX OF 04/29/2009  . Hypothyroidism 06/24/2007  . Dyslipidemia 06/24/2007  . Essential hypertension 06/24/2007  . OBESITY, MORBID 06/22/2007    Randy [name], SPT 09/19/2016, 12:15 PM  [facility] [address]  [address] [place], [place], [address] Phone: [phone]   Fax:  [phone]  Name: GERRIT RAFALSKI MRN: [id number] Date of Birth: 10-11-1960

## 2016-09-21 ENCOUNTER — Ambulatory Visit: Payer: Self-pay

## 2016-09-26 ENCOUNTER — Ambulatory Visit: Payer: Self-pay | Admitting: Physical Therapy

## 2016-09-26 DIAGNOSIS — R2689 Other abnormalities of gait and mobility: Secondary | ICD-10-CM

## 2016-09-26 DIAGNOSIS — M25562 Pain in left knee: Secondary | ICD-10-CM

## 2016-09-26 DIAGNOSIS — R262 Difficulty in walking, not elsewhere classified: Secondary | ICD-10-CM

## 2016-09-26 DIAGNOSIS — M25662 Stiffness of left knee, not elsewhere classified: Secondary | ICD-10-CM

## 2016-09-26 NOTE — Therapy (Signed)
[facility] [address]  [place], [place], [address] Phone: [phone]   Fax:  [phone]  Physical Therapy Treatment  Patient Details  Name: Randy [name] MRN: [id number] Date of Birth: 1960/12/17 Referring Provider: Wyatt Portela PA-C for R. Hart Robinsons, MD  Encounter Date: 09/26/2016      PT End of Session - 09/26/16 1156    Visit Number 10   Number of Visits 24   Date for PT Re-Evaluation 11/04/16   Authorization Type Self pay   PT Start Time 1107   PT Stop Time 1150   PT Time Calculation (min) 43 min   Activity Tolerance Patient tolerated treatment well   Behavior During Therapy [facility] for tasks assessed/performed      Past Medical History:  Diagnosis Date  . At risk for sleep apnea    STOP-BANG= 6         SENT TO PCP 06-24-2016  . History of thyroid nodule    s/p  right thyroid lopectomy--  per path report:  follicular adenoma, chronic thyroiditis  . Hyperlipidemia   . Hypertension   . Hypogonadism male   . Hypothyroidism   . Other specified disorders of tendon, right knee    quad tendon tear  . Wears glasses     Past Surgical History:  Procedure Laterality Date  . COLONOSCOPY  2006  . EYE SURGERY Right age 26   removal glass  . HYPOSPADIAS CORRECTION  age 70  . QUADRICEPS TENDON REPAIR Left 06/28/2016   Procedure: LEFT KNEE REPAIR QUADRICEP TENDON;  Surgeon: Sydnee Cabal, MD;  Location: [facility];  Service: Orthopedics;  Laterality: Left;  . THYROID LOBECTOMY Right 02/03/2005    There were no vitals filed for this visit.      Subjective Assessment - 09/26/16 1112    Subjective pt states knee feels good, just a little stiff. Pt feels like he is having trouble with stairs. Pt reports he has work going on and may need to put off therapy a few weeks.    Pertinent History L Quad tendon rupture repair 06/28/16   Patient Stated Goals "Get my knee back to normal"   Currently in Pain? No/denies   Pain Score 0-No pain            [facility] PT Assessment - 09/26/16 1107      Observation/Other Assessments   Focus on Therapeutic Outcomes (FOTO)  60% (40% limitation)     AROM   Left Knee Extension 0   Left Knee Flexion 108     Strength   Strength Assessment Site Hip;Knee   Right/Left Hip Right;Left   Right Hip Flexion 4/5   Right Hip Extension 4/5   Right Hip External Rotation  4+/5   Right Hip Internal Rotation 4/5   Right Hip ABduction 4+/5   Right Hip ADduction 4/5   Left Hip Flexion 4-/5   Left Hip Extension 4/5   Left Hip External Rotation 4-/5   Left Hip Internal Rotation 4/5   Left Hip ABduction 4/5   Left Hip ADduction 4-/5   Right/Left Knee Right;Left   Right Knee Flexion 5/5   Right Knee Extension 5/5   Left Knee Flexion 4/5   Left Knee Extension 4-/5                     [facility] Adult PT Treatment/Exercise - 09/26/16 1107      Knee/Hip Exercises: Aerobic  Stationary Bike lvl 3 x 6'     Knee/Hip Exercises: Supine   Bridges Limitations 10 reps with no resistance; 10 reps with Green TB   Bridges with Clamshell 20 reps   Knee Flexion AAROM;Left;20 reps   Knee Flexion Limitations heals on peanut ball; 5" hold                  PT Short Term Goals - 09/09/16 0940      PT SHORT TERM GOAL #1   Title Independent with initial HEP by 09/09/16   Status Achieved     PT SHORT TERM GOAL #2   Title L knee ROM 0-90 w/o pain by 09/09/16   Status Achieved           PT Long Term Goals - 09/26/16 1218      PT LONG TERM GOAL #1   Title Independent with advanced HEP by 11/05/15   Status On-going     PT LONG TERM GOAL #2   Title L knee AROM >/= 0-110 by 11/05/15   Status Partially Met     PT LONG TERM GOAL #3   Title L hip strength >/= 4+/5 by 11/05/15   Status On-going     PT LONG TERM GOAL #4   Title L knee strength >/= 4+/5 by 11/05/15   Status On-going     PT LONG TERM GOAL #5   Title Pt able to  perform all normal daily chores, ambulate and negotiate stairs w/o restriction due to L knee LOM, weakness or pain by 11/05/15   Status On-going               Plan - 09/26/16 1156    Clinical Impression Statement Pt is progressing well with L knee ROM with AROM currently 0-108 degrees. hip/knee strength improving with average strength on L LE 4-/5 (refer to above MMT for specific muscle grades). Pt continues to report having trouble descending and ascending stairs. Therapy will continue to focus on progressing hip/knee strength, L knee ROM, and normalizing gait patterns for level surfaces and stairs.    Rehab Potential Good   Clinical Impairments Affecting Rehab Potential morbid obesity, HTN   PT Treatment/Interventions Patient/family education;Therapeutic exercise;Neuromuscular re-education;Manual techniques;Scar mobilization;Taping;Therapeutic activities;Functional mobility training;Gait training;Stair training;Electrical Stimulation;Cryotherapy;Vasopneumatic Device;ADLs/Self Care Home Management   PT Next Visit Plan Continue with progression of hip/knee strengthening & L knee ROM per quad tendon protocol; pt will miss extended period of time & will need updated HEP   Consulted and Agree with Plan of Care Patient      Patient will benefit from skilled therapeutic intervention in order to improve the following deficits and impairments:  Decreased range of motion, Impaired flexibility, Decreased strength, Increased fascial restricitons, Decreased scar mobility, Difficulty walking, Abnormal gait, Decreased activity tolerance, Decreased endurance, Pain  Visit Diagnosis: Stiffness of left knee, not elsewhere classified  Difficulty in walking, not elsewhere classified  Other abnormalities of gait and mobility  Acute pain of left knee     Problem List Patient Active Problem List   Diagnosis Date Noted  . S/P knee surgery 06/28/2016  . Pain of right heel 01/01/2013  . RUQ abdominal  pain 06/19/2012  . Well adult exam 01/27/2012  . HYPOGONADISM 08/24/2009  . 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 COLONIC POLYPS, HX OF 04/29/2009  . Hypothyroidism 06/24/2007  . Dyslipidemia 06/24/2007  . Essential hypertension 06/24/2007  . OBESITY, MORBID 06/22/2007    Lauralee Evener, SPT 09/26/2016, 12:20 PM  [facility] [address]  [place], [place], [address] Phone: [phone]   Fax:  [phone]  Name: Randy [name] MRN: [id number] Date of Birth: 03/06/1961

## 2016-09-28 ENCOUNTER — Ambulatory Visit: Payer: Self-pay | Admitting: Physical Therapy

## 2016-10-03 ENCOUNTER — Ambulatory Visit: Payer: Self-pay | Admitting: Physical Therapy

## 2016-10-03 DIAGNOSIS — M25662 Stiffness of left knee, not elsewhere classified: Secondary | ICD-10-CM

## 2016-10-03 DIAGNOSIS — M25562 Pain in left knee: Secondary | ICD-10-CM

## 2016-10-03 DIAGNOSIS — R262 Difficulty in walking, not elsewhere classified: Secondary | ICD-10-CM

## 2016-10-03 DIAGNOSIS — R2689 Other abnormalities of gait and mobility: Secondary | ICD-10-CM

## 2016-10-03 NOTE — Therapy (Addendum)
[facility] [address]  [place], [place], [address] Phone: [phone]   Fax:  [phone]  Physical Therapy Treatment  Patient Details  Name: Randy [name] MRN: [id number] Date of Birth: 12/11/1960 Referring Provider: Wyatt Portela PA-C for R. Hart Robinsons, MD  Encounter Date: 10/03/2016      PT End of Session - 10/03/16 1455    Visit Number 11   Number of Visits 24   Date for PT Re-Evaluation 11/04/16   Authorization Type Self pay   PT Start Time 0248   PT Stop Time 0337   PT Time Calculation (min) 49 min   Activity Tolerance Patient tolerated treatment well   Behavior During Therapy [facility] for tasks assessed/performed      Past Medical History:  Diagnosis Date  . At risk for sleep apnea    STOP-BANG= 6         SENT TO PCP 06-24-2016  . History of thyroid nodule    s/p  right thyroid lopectomy--  per path report:  follicular adenoma, chronic thyroiditis  . Hyperlipidemia   . Hypertension   . Hypogonadism male   . Hypothyroidism   . Other specified disorders of tendon, right knee    quad tendon tear  . Wears glasses     Past Surgical History:  Procedure Laterality Date  . COLONOSCOPY  2006  . EYE SURGERY Right age 76   removal glass  . HYPOSPADIAS CORRECTION  age 21  . QUADRICEPS TENDON REPAIR Left 06/28/2016   Procedure: LEFT KNEE REPAIR QUADRICEP TENDON;  Surgeon: Sydnee Cabal, MD;  Location: [facility];  Service: Orthopedics;  Laterality: Left;  . THYROID LOBECTOMY Right 02/03/2005    There were no vitals filed for this visit.      Subjective Assessment - 10/03/16 1452    Subjective pt states knee feels fine most of the time. pt received new brace from recent doctor visit. He only wears brace when he is not at home and has follow up with doctor in 6 weeks. Doctor seemed pleased with progress.    Pertinent History L Quad tendon rupture repair 06/28/16   Patient Stated  Goals "Get my knee back to normal"   Currently in Pain? No/denies   Pain Score 0-No pain                         [facility] Adult PT Treatment/Exercise - 10/03/16 1456      Knee/Hip Exercises: Stretches   Quad Stretch Left;2 reps;30 seconds   Quad Stretch Limitations prone with strap     Knee/Hip Exercises: Aerobic   Stationary Bike lvl 3 x 6'     Knee/Hip Exercises: Standing   Terminal Knee Extension 15 reps;Left;Theraband   Theraband Level (Terminal Knee Extension) Level 4 (Blue)   Terminal Knee Extension Limitations Using chair for UE support   Forward Step Up Left;Hand Hold: 2;15 reps;Step Height: 6"   Step Down Left;Hand Hold: 2;Step Height: 4";Step Height: 2"   Step Down Limitations started with 4" step but was unable to perform correctly, tried 2" step and was unable to perform; deferred exercise   Other Standing Knee Exercises Lateral band walks x 30' each side; green TB around ankles     Knee/Hip Exercises: Supine   Bridges Limitations blue TB   Bridges with Clamshell 15 reps   Knee Flexion AAROM;Left;15 reps   Knee Flexion Limitations heals on peanut  ball; 5" hold                PT Education - 10/03/16 1549    Education provided Yes   Education Details Updated HEP   Person(s) Educated Patient   Methods Explanation;Demonstration;Handout   Comprehension Verbalized understanding;Returned demonstration          PT Short Term Goals - 09/09/16 0940      PT SHORT TERM GOAL #1   Title Independent with initial HEP by 09/09/16   Status Achieved     PT SHORT TERM GOAL #2   Title L knee ROM 0-90 w/o pain by 09/09/16   Status Achieved           PT Long Term Goals - 09/26/16 1218      PT LONG TERM GOAL #1   Title Independent with advanced HEP by 11/05/15   Status On-going     PT LONG TERM GOAL #2   Title L knee AROM >/= 0-110 by 11/05/15   Status Partially Met     PT LONG TERM GOAL #3   Title L hip strength >/= 4+/5 by 11/05/15    Status On-going     PT LONG TERM GOAL #4   Title L knee strength >/= 4+/5 by 11/05/15   Status On-going     PT LONG TERM GOAL #5   Title Pt able to perform all normal daily chores, ambulate and negotiate stairs w/o restriction due to L knee LOM, weakness or pain by 11/05/15   Status On-going               Plan - 10/03/16 1539    Clinical Impression Statement pt returns to therapy after follow up visit with doctor. Pt reporting doctor is pleased and has let him start wearing a smaller brace. Pt reports knee is feeling fine but will be gone for a couple of weeks due to work obligations. pt was given updated HEP to do while gone from therapy. Pt tolerated progression of LE strengthening. Pt was able to perform step ups using B UE support but was unable to complete step downs due to lack of eccentric quad control. pt continues to need verbal cues during exercises to remain in upright posture.    Rehab Potential Good   Clinical Impairments Affecting Rehab Potential morbid obesity, HTN   PT Treatment/Interventions Patient/family education;Therapeutic exercise;Neuromuscular re-education;Manual techniques;Scar mobilization;Taping;Therapeutic activities;Functional mobility training;Gait training;Stair training;Electrical Stimulation;Cryotherapy;Vasopneumatic Device;ADLs/Self Care Home Management   PT Next Visit Plan Review how HEP went; continue with progression of L hip/knee strengthening and L knee ROM per quad tendon protocol; reassess to see if able to perform step down and work on eccentric quad control if unable to perform    Consulted and Agree with Plan of Care Patient      Patient will benefit from skilled therapeutic intervention in order to improve the following deficits and impairments:  Decreased range of motion, Impaired flexibility, Decreased strength, Increased fascial restricitons, Decreased scar mobility, Difficulty walking, Abnormal gait, Decreased activity tolerance, Decreased  endurance, Pain  Visit Diagnosis: Stiffness of left knee, not elsewhere classified  Difficulty in walking, not elsewhere classified  Other abnormalities of gait and mobility  Acute pain of left knee     Problem List Patient Active Problem List   Diagnosis Date Noted  . S/P knee surgery 06/28/2016  . Pain of right heel 01/01/2013  . RUQ abdominal pain 06/19/2012  . Well adult exam 01/27/2012  . HYPOGONADISM 08/24/2009  .  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 COLONIC POLYPS, HX OF 04/29/2009  . Hypothyroidism 06/24/2007  . Dyslipidemia 06/24/2007  . Essential hypertension 06/24/2007  . OBESITY, MORBID 06/22/2007    Lauralee Evener, SPT 10/03/2016, 6:07 PM  Read, reviewed, edited and agree with student's findings and recommendations.   Percival Spanish, PT, MPT 10/03/16, 7:07 PM   [facility] [address]  [place], [place], [address] Phone: [phone]   Fax:  [phone]  Name: Randy [name] MRN: [id number] Date of Birth: 11/20/1960  PHYSICAL THERAPY DISCHARGE SUMMARY  Visits from Start of Care: 11  Current functional level related to goals / functional outcomes:   Unable to assess as pt failed to return for further PT as planned.   Remaining deficits:   As above.   Education / Equipment:   HEP  Plan: Patient agrees to discharge.  Patient goals were partially met. Patient is being discharged due to not returning since the last visit.  ?????     Percival Spanish, PT, MPT 11/16/16, 10:10 AM  [facility] [address]  [place], [place], [address] Phone: [phone]   Fax:  [phone]

## 2017-04-14 ENCOUNTER — Telehealth: Payer: Self-pay | Admitting: Internal Medicine

## 2017-04-14 NOTE — Telephone Encounter (Signed)
Pt called to make an appointment in October for his CPE,  He has started a new job in [place] and gets off at 3. He would like to know if we sch him for a CPE at 3:15 if it would be ok if he gets here st 3:30. He states he cannot get off before 3.  Please advise

## 2017-04-15 NOTE — Telephone Encounter (Signed)
Ok Thx 

## 2017-04-18 NOTE — Telephone Encounter (Signed)
okey per plot

## 2017-04-18 NOTE — Telephone Encounter (Signed)
LVM notifying Pt.

## 2017-04-19 ENCOUNTER — Encounter: Payer: Self-pay | Admitting: Internal Medicine

## 2017-05-08 ENCOUNTER — Other Ambulatory Visit: Payer: Self-pay | Admitting: Internal Medicine

## 2017-05-09 NOTE — Telephone Encounter (Signed)
Pt called requesting a refill on these medications. He rescheduled his physical to November because he started a new job and is having to work around that schedule.

## 2017-07-24 ENCOUNTER — Other Ambulatory Visit (INDEPENDENT_AMBULATORY_CARE_PROVIDER_SITE_OTHER): Payer: BC Managed Care – PPO

## 2017-07-24 ENCOUNTER — Encounter: Payer: Self-pay | Admitting: Internal Medicine

## 2017-07-24 ENCOUNTER — Ambulatory Visit: Payer: BC Managed Care – PPO | Admitting: Internal Medicine

## 2017-07-24 VITALS — BP 132/84 | HR 72 | Temp 98.2°F | Ht 73.0 in | Wt 333.0 lb

## 2017-07-24 DIAGNOSIS — Z23 Encounter for immunization: Secondary | ICD-10-CM | POA: Diagnosis not present

## 2017-07-24 DIAGNOSIS — E034 Atrophy of thyroid (acquired): Secondary | ICD-10-CM | POA: Diagnosis not present

## 2017-07-24 DIAGNOSIS — Z0000 Encounter for general adult medical examination without abnormal findings: Secondary | ICD-10-CM

## 2017-07-24 LAB — URINALYSIS
BILIRUBIN URINE: NEGATIVE
HGB URINE DIPSTICK: NEGATIVE
KETONES UR: NEGATIVE
Leukocytes, UA: NEGATIVE
Nitrite: NEGATIVE
Specific Gravity, Urine: 1.01 (ref 1.000–1.030)
TOTAL PROTEIN, URINE-UPE24: NEGATIVE
URINE GLUCOSE: NEGATIVE
UROBILINOGEN UA: 0.2 (ref 0.0–1.0)
pH: 7 (ref 5.0–8.0)

## 2017-07-24 LAB — LIPID PANEL
CHOLESTEROL: 127 mg/dL (ref 0–200)
HDL: 32.7 mg/dL — AB (ref >39.00)
LDL CALC: 79 mg/dL (ref 0–99)
NonHDL: 94.69
Total CHOL/HDL Ratio: 4
Triglycerides: 76 mg/dL (ref 0.0–149.0)
VLDL: 15.2 mg/dL (ref 0.0–40.0)

## 2017-07-24 LAB — BASIC METABOLIC PANEL
BUN: 17 mg/dL (ref 6–23)
CHLORIDE: 102 meq/L (ref 96–112)
CO2: 29 meq/L (ref 19–32)
CREATININE: 1.04 mg/dL (ref 0.40–1.50)
Calcium: 9.6 mg/dL (ref 8.4–10.5)
GFR: 78.11 mL/min (ref >60.00)
Glucose, Bld: 80 mg/dL (ref 70–99)
POTASSIUM: 4 meq/L (ref 3.5–5.1)
Sodium: 140 mEq/L (ref 135–145)

## 2017-07-24 LAB — PSA: PSA: 0.56 ng/mL (ref 0.10–4.00)

## 2017-07-24 LAB — CBC WITH DIFFERENTIAL/PLATELET
BASOS PCT: 1.1 % (ref 0.0–3.0)
Basophils Absolute: 0.1 10*3/uL (ref 0.0–0.1)
EOS ABS: 0.2 10*3/uL (ref 0.0–0.7)
EOS PCT: 3.2 % (ref 0.0–5.0)
HCT: 43.7 % (ref 39.0–52.0)
Hemoglobin: 14.8 g/dL (ref 13.0–17.0)
LYMPHS ABS: 1.4 10*3/uL (ref 0.7–4.0)
Lymphocytes Relative: 28.4 % (ref 12.0–46.0)
MCHC: 33.8 g/dL (ref 30.0–36.0)
MCV: 90 fl (ref 78.0–100.0)
MONO ABS: 0.5 10*3/uL (ref 0.1–1.0)
Monocytes Relative: 10.7 % (ref 3.0–12.0)
NEUTROS ABS: 2.7 10*3/uL (ref 1.4–7.7)
NEUTROS PCT: 56.6 % (ref 43.0–77.0)
PLATELETS: 273 10*3/uL (ref 150.0–400.0)
RBC: 4.85 Mil/uL (ref 4.22–5.81)
RDW: 13.1 % (ref 11.5–15.5)
WBC: 4.8 10*3/uL (ref 4.0–10.5)

## 2017-07-24 LAB — TESTOSTERONE: TESTOSTERONE: 317.24 ng/dL (ref 300.00–890.00)

## 2017-07-24 LAB — HEPATIC FUNCTION PANEL
ALBUMIN: 4.2 g/dL (ref 3.5–5.2)
ALK PHOS: 46 U/L (ref 39–117)
ALT: 30 U/L (ref 0–53)
AST: 18 U/L (ref 0–37)
BILIRUBIN DIRECT: 0.1 mg/dL (ref 0.0–0.3)
TOTAL PROTEIN: 6.7 g/dL (ref 6.0–8.3)
Total Bilirubin: 0.5 mg/dL (ref 0.2–1.2)

## 2017-07-24 LAB — TSH: TSH: 1.37 u[IU]/mL (ref 0.35–4.50)

## 2017-07-24 MED ORDER — BENAZEPRIL HCL 20 MG PO TABS: 20.0000 mg | ORAL_TABLET | Freq: Every day | ORAL | 3 refills | Status: DC

## 2017-07-24 MED ORDER — LOVASTATIN 40 MG PO TABS: 40.0000 mg | ORAL_TABLET | Freq: Every day | ORAL | 3 refills | Status: DC

## 2017-07-24 MED ORDER — TRIAMTERENE-HCTZ 37.5-25 MG PO TABS: 1.0000 | ORAL_TABLET | Freq: Every day | ORAL | 0 refills | Status: DC

## 2017-07-24 MED ORDER — LEVOTHYROXINE SODIUM 125 MCG PO TABS: ORAL_TABLET | ORAL | 3 refills | Status: DC

## 2017-07-24 MED ORDER — AMLODIPINE BESYLATE 5 MG PO TABS: 5.0000 mg | ORAL_TABLET | Freq: Every day | ORAL | 3 refills | Status: DC

## 2017-07-24 NOTE — Assessment & Plan Note (Signed)
on herbal[facility] plan 2018 Ref to Dr Dalbert GarnetBeasley was recommended and offered

## 2017-07-24 NOTE — Patient Instructions (Signed)
Dr Caren Beasley 

## 2017-07-24 NOTE — Assessment & Plan Note (Signed)
Labs

## 2017-07-24 NOTE — Assessment & Plan Note (Addendum)
We discussed age appropriate health related issues, including available/recomended screening tests and vaccinations. We discussed a need for adhering to healthy diet and exercise. Labs were ordered to be later reviewed . All questions were answered.  Cologuard - will arrange; last colon 2004 w/a 7 mm polyp pt will not be able to do colonoscopy before summer Shingrix

## 2017-07-24 NOTE — Progress Notes (Signed)
Subjective:  Patient ID: Randy [name] Patch, male    DOB: 04/30/1961  Age: 56 y.o. MRN: [id number]  CC: No chief complaint on file.   HPI Randy [name] Findling presents for a well exam  He had a L quad traumatic rupture - fell down the steps - [name]/p surgery Oct 2017  Outpatient Medications Prior to Visit  Medication Sig Dispense Refill  . amLODipine-benazepril (LOTREL) 5-20 MG capsule Take 1 capsule by mouth daily. (Patient taking differently: Take 1 capsule by mouth every morning. ) 30 capsule 11  . aspirin EC 325 MG tablet Take 1 tablet (325 mg total) by mouth 2 (two) times daily. 60 tablet 0  . Cholecalciferol (VITAMIN D-3) 1000 units CAPS Take 1 capsule by mouth every morning.    . Coenzyme Q10 (COQ10) 100 MG CAPS Take 1 capsule by mouth every morning.    . Cyanocobalamin (B-12) 2000 MCG TABS Take 1 tablet by mouth every morning.    Marland Kitchen. levothyroxine (SYNTHROID, LEVOTHROID) 125 MCG tablet TAKE TWO TABLETS BY MOUTH ONCE DAILY BEFORE BREAKFAST 180 tablet 0  . lovastatin (MEVACOR) 40 MG tablet Take 1 tablet (40 mg total) by mouth at bedtime. Must keep Nov appt for future refills 90 tablet 0  . Multiple Vitamin (MULTIVITAMIN) tablet Take 1 tablet by mouth every morning.    . Omega-3 Fatty Acids (FISH OIL) 1000 MG CAPS Take 1 capsule by mouth every morning.    . triamterene-hydrochlorothiazide (MAXZIDE-25) 37.5-25 MG tablet Take 1 tablet by mouth daily. Must keep Nov appt for future refills 90 tablet 0  . Turmeric 500 MG TABS Take 1 capsule by mouth every morning.    . cephALEXin (KEFLEX) 500 MG capsule Take 1 capsule (500 mg total) by mouth 3 (three) times daily. 12 capsule 0  . methocarbamol (ROBAXIN) 500 MG tablet Take 1 tablet (500 mg total) by mouth every 6 (six) hours as needed. 50 tablet 2  . Misc Natural Products (TART CHERRY ADVANCED) CAPS Take 1 capsule by mouth every morning.    Marland Kitchen. oxyCODONE-acetaminophen (ROXICET) 5-325 MG tablet Take 1-2 tablets by mouth every 4 (four) hours as needed. 60 tablet  0   No facility-administered medications prior to visit.     ROS Review of Systems  Constitutional: Negative for appetite change, fatigue and unexpected weight change.  HENT: Negative for congestion, nosebleeds, sneezing, sore throat and trouble swallowing.   Eyes: Negative for itching and visual disturbance.  Respiratory: Negative for cough.   Cardiovascular: Negative for chest pain, palpitations and leg swelling.  Gastrointestinal: Negative for abdominal distention, blood in stool, diarrhea and nausea.  Genitourinary: Negative for frequency and hematuria.  Musculoskeletal: Negative for back pain, gait problem, joint swelling and neck pain.  Skin: Negative for rash.  Neurological: Negative for dizziness, tremors, speech difficulty and weakness.  Psychiatric/Behavioral: Negative for agitation, dysphoric mood, sleep disturbance and suicidal ideas. The patient is not nervous/anxious.     Objective:  There were no vitals taken for this visit.  BP Readings from Last 3 Encounters:  06/28/16 (!) 173/74  05/07/16 146/68  05/02/16 132/78    Wt Readings from Last 3 Encounters:  06/28/16 (!) 349 lb (158.3 kg)  05/07/16 (!) 352 lb (159.7 kg)  05/02/16 (!) 352 lb (159.7 kg)    Physical Exam  Constitutional: He is oriented to person, place, and time. He appears well-developed. No distress.  NAD  HENT:  Mouth/Throat: Oropharynx is clear and moist.  Eyes: Conjunctivae are normal. Pupils are equal, round,  and reactive to light.  Neck: Normal range of motion. No JVD present. No thyromegaly present.  Cardiovascular: Normal rate, regular rhythm, normal heart sounds and intact distal pulses. Exam reveals no gallop and no friction rub.  No murmur heard. Pulmonary/Chest: Effort normal and breath sounds normal. No respiratory distress. He has no wheezes. He has no rales. He exhibits no tenderness.  Abdominal: Soft. Bowel sounds are normal. He exhibits no distension and no mass. There is no  tenderness. There is no rebound and no guarding.  Genitourinary: Rectum normal and prostate normal. Rectal exam shows guaiac negative stool.  Musculoskeletal: Normal range of motion. He exhibits no edema or tenderness.  Lymphadenopathy:    He has no cervical adenopathy.  Neurological: He is alert and oriented to person, place, and time. He has normal reflexes. No cranial nerve deficit. He exhibits normal muscle tone. He displays a negative Romberg sign. Coordination and gait normal.  Skin: Skin is warm and dry. No rash noted.  Psychiatric: He has a normal mood and affect. His behavior is normal. Judgment and thought content normal.  L knee w/a scar Obese  Lab Results  Component Value Date   WBC 5.3 08/16/2013   HGB 14.3 06/28/2016   HCT 42.0 06/28/2016   PLT 249.0 08/16/2013   GLUCOSE 98 06/28/2016   CHOL 143 08/16/2013   TRIG 106.0 08/16/2013   HDL 28.60 (L) 08/16/2013   LDLCALC 93 08/16/2013   ALT 26 08/16/2013   AST 15 08/16/2013   NA 139 06/28/2016   K 3.3 (L) 06/28/2016   CL 98 (L) 06/28/2016   CREATININE 1.00 06/28/2016   BUN 18 06/28/2016   CO2 29 08/16/2013   TSH 1.01 08/16/2013   PSA 0.45 06/11/2007   HGBA1C 5.4 08/16/2013    No results found.  Assessment & Plan:   There are no diagnoses linked to this encounter. I have discontinued Randy [name]. Kaneshiro "Stewart"'[name] TART CHERRY ADVANCED, cephALEXin, oxyCODONE-acetaminophen, and methocarbamol. I am also having him maintain his amLODipine-benazepril, Turmeric, multivitamin, Vitamin D-3, B-12, CoQ10, Fish Oil, aspirin EC, lovastatin, levothyroxine, and triamterene-hydrochlorothiazide.  No orders of the defined types were placed in this encounter.    Follow-up: No Follow-up on file.  Sonda PrimesAlex Plotnikov, MD

## 2017-07-28 NOTE — Addendum Note (Signed)
Addended by: Scarlett PrestoFRIEDENBACH, Veria Stradley on: 07/28/2017 04:59 PM   Modules accepted: Orders

## 2017-08-30 ENCOUNTER — Telehealth: Payer: Self-pay

## 2017-08-30 NOTE — Telephone Encounter (Signed)
Left message asking patient to call back to schedule nurse visit to get first shingrix vaccine---if patient calls back, let tamara, RN at [facility] know so that vaccines can be labeled and placed in refrig

## 2017-10-15 LAB — COLOGUARD: Cologuard: NEGATIVE

## 2017-10-18 ENCOUNTER — Ambulatory Visit: Payer: BC Managed Care – PPO | Admitting: Internal Medicine

## 2017-10-18 ENCOUNTER — Encounter: Payer: Self-pay | Admitting: Internal Medicine

## 2017-10-18 VITALS — BP 122/70 | HR 82 | Temp 98.5°F | Ht 73.0 in | Wt 330.0 lb

## 2017-10-18 DIAGNOSIS — R6889 Other general symptoms and signs: Secondary | ICD-10-CM | POA: Diagnosis not present

## 2017-10-18 DIAGNOSIS — I10 Essential (primary) hypertension: Secondary | ICD-10-CM

## 2017-10-18 MED ORDER — OSELTAMIVIR PHOSPHATE 75 MG PO CAPS
75.0000 mg | ORAL_CAPSULE | Freq: Two times a day (BID) | ORAL | 0 refills | Status: DC
Start: 2017-10-18 — End: 2018-03-30

## 2017-10-18 NOTE — Patient Instructions (Addendum)
Please take all new medication as prescribed - the tamiflu as directed  Please call for any worsening, such as more fever, ST, cough, wheezing or shortness of breath  Please continue all other medications as before, and refills have been done if requested.  Please have the pharmacy call with any other refills you may need.  Please keep your appointments with your specialists as you may have planned

## 2017-10-19 DIAGNOSIS — R6889 Other general symptoms and signs: Secondary | ICD-10-CM | POA: Insufficient documentation

## 2017-10-19 NOTE — Assessment & Plan Note (Signed)
BP Readings from Last 3 Encounters:  10/18/17 122/70  07/24/17 132/84  06/28/16 (!) 173/74  stable overall by history and exam, recent data reviewed with pt, and pt to continue medical treatment as before,  to f/u any worsening symptoms or concerns

## 2017-10-19 NOTE — Assessment & Plan Note (Signed)
Mild to mod, for tamiflu course,  to f/u any worsening symptoms or concerns  

## 2017-10-19 NOTE — Progress Notes (Signed)
Subjective:    Patient ID: Randy [name], male    DOB: 05/24/1961, 57 y.o.   MRN: [id number]  HPI  Here with flu like symptoms x 2 days with fever, ST, mild scant prod cough, myalgias and weakness, and sinus congestion, but pt denies chest pain, wheezing, increased sob or doe, orthopnea, PND, increased LE swelling, palpitations, dizziness or syncope. Mother was seen in ED yesterday, + for flu with complication now hospd. Pt denies new neurological symptoms such as new headache, or facial or extremity weakness or numbness   Pt denies polydipsia, polyuria Past Medical History:  Diagnosis Date  . At risk for sleep apnea    STOP-BANG= 6         SENT TO PCP 06-24-2016  . History of thyroid nodule    s/p  right thyroid lopectomy--  per path report:  follicular adenoma, chronic thyroiditis  . Hyperlipidemia   . Hypertension   . Hypogonadism male   . Hypothyroidism   . Other specified disorders of tendon, right knee    quad tendon tear  . Wears glasses    Past Surgical History:  Procedure Laterality Date  . COLONOSCOPY  2006  . EYE SURGERY Right age 57   removal glass  . HYPOSPADIAS CORRECTION  age 89  . QUADRICEPS TENDON REPAIR Left 06/28/2016   Procedure: LEFT KNEE REPAIR QUADRICEP TENDON;  Surgeon: Eugenia Mcalpineobert Collins, MD;  [facility] ;  Service: Orthopedics;  Laterality: Left;  . THYROID LOBECTOMY Right 02/03/2005    reports that  has never smoked. he has never used smokeless tobacco. He reports that he drinks alcohol. He reports that he does not use drugs. family history includes Arthritis in his father; COPD in his father; Mental illness in his mother. No Known Allergies Current Outpatient Medications on File Prior to Visit  Medication Sig Dispense Refill  . amLODipine (NORVASC) 5 MG tablet Take 1 tablet (5 mg total) daily by mouth. 90 tablet 3  . aspirin EC 325 MG tablet Take 1 tablet (325 mg total) by mouth 2 (two) times daily. 60 tablet 0  . benazepril  (LOTENSIN) 20 MG tablet Take 1 tablet (20 mg total) daily by mouth. 90 tablet 3  . Cholecalciferol (VITAMIN D-3) 1000 units CAPS Take 1 capsule by mouth every morning.    . Coenzyme Q10 (COQ10) 100 MG CAPS Take 1 capsule by mouth every morning.    . Cyanocobalamin (B-12) 2000 MCG TABS Take 1 tablet by mouth every morning.    Marland Kitchen. levothyroxine (SYNTHROID, LEVOTHROID) 125 MCG tablet TAKE TWO TABLETS BY MOUTH ONCE DAILY BEFORE BREAKFAST 180 tablet 3  . lovastatin (MEVACOR) 40 MG tablet Take 1 tablet (40 mg total) at bedtime by mouth. Must keep Nov appt for future refills 90 tablet 3  . Multiple Vitamin (MULTIVITAMIN) tablet Take 1 tablet by mouth every morning.    . Omega-3 Fatty Acids (FISH OIL) 1000 MG CAPS Take 1 capsule by mouth every morning.    . triamterene-hydrochlorothiazide (MAXZIDE-25) 37.5-25 MG tablet Take 1 tablet daily by mouth. Must keep Nov appt for future refills 90 tablet 0  . Turmeric 500 MG TABS Take 1 capsule by mouth every morning.     No current facility-administered medications on file prior to visit.    Review of Systems All other system neg per pt    Objective:   Physical Exam BP 122/70 (BP Location: Left Arm, Patient Position: Sitting, Cuff Size: Large)   Pulse 82  Temp 98.5 F (36.9 C) (Oral)   Ht 6\' 1"  (1.854 m)   Wt (!) 330 lb (149.7 kg)   SpO2 99%   BMI 43.54 kg/m  VS noted, mild ill Constitutional: Pt appears in NAD HENT: Head: NCAT.  Right Ear: External ear normal.  Left Ear: External ear normal.  Eyes: . Pupils are equal, round, and reactive to light. Conjunctivae and EOM are normal Bilat tm's with mild erythema.  Max sinus areas mild tender.  Pharynx with mild erythema, no exudate  Nose: without d/c or deformity Neck: Neck supple. Gross normal ROM Cardiovascular: Normal rate and regular rhythm.   Pulmonary/Chest: Effort normal and breath sounds without rales or wheezing.  Neurological: Pt is alert. At baseline orientation, motor grossly  intact Skin: Skin is warm. No rashes, other new lesions, no LE edema Psychiatric: Pt behavior is normal without agitation  No other exam findings    Assessment & Plan:

## 2017-11-09 ENCOUNTER — Encounter: Payer: Self-pay | Admitting: Internal Medicine

## 2017-11-09 NOTE — Progress Notes (Signed)
Outside notes received. Information abstracted. Notes sent to scan.  

## 2017-11-18 ENCOUNTER — Other Ambulatory Visit: Payer: Self-pay | Admitting: Internal Medicine

## 2018-02-23 ENCOUNTER — Other Ambulatory Visit: Payer: Self-pay | Admitting: Internal Medicine

## 2018-02-28 ENCOUNTER — Ambulatory Visit: Payer: BC Managed Care – PPO | Admitting: Internal Medicine

## 2018-03-30 ENCOUNTER — Other Ambulatory Visit (INDEPENDENT_AMBULATORY_CARE_PROVIDER_SITE_OTHER): Payer: BC Managed Care – PPO

## 2018-03-30 ENCOUNTER — Encounter: Payer: Self-pay | Admitting: Internal Medicine

## 2018-03-30 ENCOUNTER — Ambulatory Visit (INDEPENDENT_AMBULATORY_CARE_PROVIDER_SITE_OTHER): Payer: BC Managed Care – PPO | Admitting: Internal Medicine

## 2018-03-30 VITALS — BP 128/74 | HR 79 | Temp 98.3°F | Ht 73.0 in | Wt 348.0 lb

## 2018-03-30 DIAGNOSIS — I10 Essential (primary) hypertension: Secondary | ICD-10-CM | POA: Diagnosis not present

## 2018-03-30 DIAGNOSIS — E785 Hyperlipidemia, unspecified: Secondary | ICD-10-CM | POA: Diagnosis not present

## 2018-03-30 DIAGNOSIS — M25511 Pain in right shoulder: Secondary | ICD-10-CM

## 2018-03-30 DIAGNOSIS — E034 Atrophy of thyroid (acquired): Secondary | ICD-10-CM

## 2018-03-30 DIAGNOSIS — M25519 Pain in unspecified shoulder: Secondary | ICD-10-CM | POA: Insufficient documentation

## 2018-03-30 LAB — BASIC METABOLIC PANEL
BUN: 22 mg/dL (ref 6–23)
CO2: 30 mEq/L (ref 19–32)
Calcium: 8.8 mg/dL (ref 8.4–10.5)
Chloride: 105 mEq/L (ref 96–112)
Creatinine, Ser: 1.28 mg/dL (ref 0.40–1.50)
GFR: 61.32 mL/min (ref >60.00)
Glucose, Bld: 98 mg/dL (ref 70–99)
POTASSIUM: 3.7 meq/L (ref 3.5–5.1)
SODIUM: 141 meq/L (ref 135–145)

## 2018-03-30 LAB — TSH: TSH: 0.75 u[IU]/mL (ref 0.35–4.50)

## 2018-03-30 MED ORDER — TRIAMTERENE-HCTZ 37.5-25 MG PO TABS: ORAL_TABLET | ORAL | 3 refills | Status: DC

## 2018-03-30 MED ORDER — AMLODIPINE BESYLATE 5 MG PO TABS
5.0000 mg | ORAL_TABLET | Freq: Every day | ORAL | 3 refills | Status: DC
Start: 2018-03-30 — End: 2019-03-07

## 2018-03-30 MED ORDER — BENAZEPRIL HCL 20 MG PO TABS: 20.0000 mg | ORAL_TABLET | Freq: Every day | ORAL | 3 refills | Status: DC

## 2018-03-30 MED ORDER — LOVASTATIN 40 MG PO TABS
40.0000 mg | ORAL_TABLET | Freq: Every day | ORAL | 3 refills | Status: DC
Start: 2018-03-30 — End: 2019-03-07

## 2018-03-30 MED ORDER — LEVOTHYROXINE SODIUM 125 MCG PO TABS
ORAL_TABLET | ORAL | 3 refills | Status: DC
Start: 2018-03-30 — End: 2019-03-07

## 2018-03-30 NOTE — Assessment & Plan Note (Signed)
Lovastatin 

## 2018-03-30 NOTE — Assessment & Plan Note (Signed)
R rotator cuff strain  Sports med ref if not better in 6 weeks

## 2018-03-30 NOTE — Assessment & Plan Note (Signed)
Wt Readings from Last 3 Encounters:  03/30/18 (!) 348 lb (157.9 kg)  10/18/17 (!) 330 lb (149.7 kg)  07/24/17 (!) 333 lb (151 kg)

## 2018-03-30 NOTE — Patient Instructions (Signed)
Shoulder Impingement Syndrome Rehab  Ask your health care provider which exercises are safe for you. Do exercises exactly as told by your health care provider and adjust them as directed. It is normal to feel mild stretching, pulling, tightness, or discomfort as you do these exercises, but you should stop right away if you feel sudden pain or your pain gets worse. Do not begin these exercises until told by your health care provider.  Stretching and range of motion exercise  This exercise warms up your muscles and joints and improves the movement and flexibility of your shoulder. This exercise also helps to relieve pain and stiffness.  Exercise A: Passive horizontal adduction    1. Sit or stand and pull your left / right elbow across your chest, toward your other shoulder. Stop when you feel a gentle stretch in the back of your shoulder and upper arm.  ? Keep your arm at shoulder height.  ? Keep your arm as close to your body as you comfortably can.  2. Hold for __________ seconds.  3. Slowly return to the starting position.  Repeat __________ times. Complete this exercise __________ times a day.  Strengthening exercises  These exercises build strength and endurance in your shoulder. Endurance is the ability to use your muscles for a long time, even after they get tired.  Exercise B: External rotation, isometric  1. Stand or sit in a doorway, facing the door frame.  2. Bend your left / right elbow and place the back of your wrist against the door frame. Only your wrist should be touching the frame. Keep your upper arm at your side.  3. Gently press your wrist against the door frame, as if you are trying to push your arm away from your abdomen.  ? Avoid shrugging your shoulder while you press your hand against the door frame. Keep your shoulder blade tucked down toward the middle of your back.  4. Hold for __________ seconds.  5. Slowly release the tension, and relax your muscles completely before you do the exercise  again.  Repeat __________ times. Complete this exercise __________ times a day.  Exercise C: Internal rotation, isometric    1. Stand or sit in a doorway, facing the door frame.  2. Bend your left / right elbow and place the inside of your wrist against the door frame. Only your wrist should be touching the frame. Keep your upper arm at your side.  3. Gently press your wrist against the door frame, as if you are trying to push your arm toward your abdomen.  ? Avoid shrugging your shoulder while you press your hand against the door frame. Keep your shoulder blade tucked down toward the middle of your back.  4. Hold for __________ seconds.  5. Slowly release the tension, and relax your muscles completely before you do the exercise again.  Repeat __________ times. Complete this exercise __________ times a day.  Exercise D: Scapular protraction, supine    1. Lie on your back on a firm surface. Hold a __________ weight in your left / right hand.  2. Raise your left / right arm straight into the air so your hand is directly above your shoulder joint.  3. Push the weight into the air so your shoulder lifts off of the surface that you are lying on. Do not move your head, neck, or back.  4. Hold for __________ seconds.  5. Slowly return to the starting position. Let your muscles relax completely before   you repeat this exercise.  Repeat __________ times. Complete this exercise __________ times a day.  Exercise E: Scapular retraction    1. Sit in a stable chair without armrests, or stand.  2. Secure an exercise band to a stable object in front of you so the band is at shoulder height.  3. Hold one end of the exercise band in each hand. Your palms should face down.  4. Squeeze your shoulder blades together and move your elbows slightly behind you. Do not shrug your shoulders while you do this.  5. Hold for __________ seconds.  6. Slowly return to the starting position.  Repeat __________ times. Complete this exercise __________  times a day.  Exercise F: Shoulder extension    1. Sit in a stable chair without armrests, or stand.  2. Secure an exercise band to a stable object in front of you where the band is above shoulder height.  3. Hold one end of the exercise band in each hand.  4. Straighten your elbows and lift your hands up to shoulder height.  5. Squeeze your shoulder blades together and pull your hands down to the sides of your thighs. Stop when your hands are straight down by your sides. Do not let your hands go behind your body.  6. Hold for __________ seconds.  7. Slowly return to the starting position.  Repeat __________ times. Complete this exercise __________ times a day.  This information is not intended to replace advice given to you by your health care provider. Make sure you discuss any questions you have with your health care provider.  Document Released: 08/29/2005 Document Revised: 05/05/2016 Document Reviewed: 08/01/2015  Elsevier Interactive Patient Education © 2018 [employer]

## 2018-03-30 NOTE — Assessment & Plan Note (Signed)
Dyslipidemia

## 2018-03-30 NOTE — Assessment & Plan Note (Signed)
Lotrel, Maxzide 

## 2018-03-30 NOTE — Progress Notes (Signed)
Subjective:  Patient ID: Randy [name], male    DOB: 1961-07-04  Age: 57 y.o. MRN: [id number]  CC: No chief complaint on file.   Randy [name] presents for HTN, B12 def, hypothyroidism f/u C/o R shoulder pain w/ROM x weeks; no injury    ROS: Review of Systems  Constitutional: Positive for unexpected weight change. Negative for appetite change and fatigue.  HENT: Negative for congestion, nosebleeds, sneezing, sore throat and trouble swallowing.   Eyes: Negative for itching and visual disturbance.  Respiratory: Negative for cough.   Cardiovascular: Negative for chest pain, palpitations and leg swelling.  Gastrointestinal: Negative for abdominal distention, blood in stool, diarrhea and nausea.  Genitourinary: Negative for frequency and hematuria.  Musculoskeletal: Positive for arthralgias. Negative for back pain, gait problem, joint swelling and neck pain.  Skin: Negative for rash.  Neurological: Negative for dizziness, tremors, speech difficulty and weakness.  Psychiatric/Behavioral: Negative for agitation, dysphoric mood and sleep disturbance. The patient is not nervous/anxious.    R shoulder pain  Objective:  BP 128/74 (BP Location: Left Arm, Patient Position: Sitting, Cuff Size: Large)   Pulse 79   Temp 98.3 F (36.8 C) (Oral)   Ht 6\' 1"  (1.854 m)   Wt (!) 348 lb (157.9 kg)   SpO2 96%   BMI 45.91 kg/m   BP Readings from Last 3 Encounters:  03/30/18 128/74  10/18/17 122/70  07/24/17 132/84    Wt Readings from Last 3 Encounters:  03/30/18 (!) 348 lb (157.9 kg)  10/18/17 (!) 330 lb (149.7 kg)  07/24/17 (!) 333 lb (151 kg)    Physical Exam  Constitutional: He is oriented to person, place, and time. He appears well-developed. No distress.  NAD  HENT:  Mouth/Throat: Oropharynx is clear and moist.  Eyes: Pupils are equal, round, and reactive to light. Conjunctivae are normal.  Neck: Normal range of motion. No JVD present. No thyromegaly present.  Cardiovascular:  Normal rate, regular rhythm, normal heart sounds and intact distal pulses. Exam reveals no gallop and no friction rub.  No murmur heard. Pulmonary/Chest: Effort normal and breath sounds normal. No respiratory distress. He has no wheezes. He has no rales. He exhibits no tenderness.  Abdominal: Soft. Bowel sounds are normal. He exhibits no distension and no mass. There is no tenderness. There is no rebound and no guarding.  Musculoskeletal: Normal range of motion. He exhibits tenderness. He exhibits no edema.  Lymphadenopathy:    He has no cervical adenopathy.  Neurological: He is alert and oriented to person, place, and time. He has normal reflexes. No cranial nerve deficit. He exhibits normal muscle tone. He displays a negative Romberg sign. Coordination and gait normal.  Skin: Skin is warm and dry. No rash noted.  Psychiatric: He has a normal mood and affect. His behavior is normal. Judgment and thought content normal.  R subacromial space is tender w/ROM  Lab Results  Component Value Date   WBC 4.8 07/24/2017   HGB 14.8 07/24/2017   HCT 43.7 07/24/2017   PLT 273.0 07/24/2017   GLUCOSE 80 07/24/2017   CHOL 127 07/24/2017   TRIG 76.0 07/24/2017   HDL 32.70 (L) 07/24/2017   LDLCALC 79 07/24/2017   ALT 30 07/24/2017   AST 18 07/24/2017   NA 140 07/24/2017   K 4.0 07/24/2017   CL 102 07/24/2017   CREATININE 1.04 07/24/2017   BUN 17 07/24/2017   CO2 29 07/24/2017   TSH 1.37 07/24/2017   PSA 0.56 07/24/2017  HGBA1C 5.4 08/16/2013    No results found.  Assessment & Plan:   There are no diagnoses linked to this encounter.   No orders of the defined types were placed in this encounter.    Follow-up: No follow-ups on file.  Sonda PrimesAlex Plotnikov, MD

## 2018-03-30 NOTE — Assessment & Plan Note (Signed)
Levothroid 

## 2018-04-03 ENCOUNTER — Telehealth: Payer: Self-pay | Admitting: Internal Medicine

## 2018-04-03 NOTE — Telephone Encounter (Signed)
Copied from CRM [id number]. Topic: Quick Communication - Lab Results >> Apr 03, 2018  9:35 AM Barbette ReichmannWrenn, Taylor E, CMA wrote: Called patient to inform them of 03/30/18 lab results. When patient returns call, triage nurse may disclose results. >> Apr 03, 2018  3:34 PM Zada GirtLander, Lumin L wrote: No [facility] RN free for results.

## 2018-04-04 NOTE — Telephone Encounter (Signed)
Pt given results per notes of Dr. Posey ReaPlotnikov on 04/01/18, patient verbalized understanding. Unable to document in result note due to [facility].

## 2018-09-10 ENCOUNTER — Other Ambulatory Visit (INDEPENDENT_AMBULATORY_CARE_PROVIDER_SITE_OTHER): Payer: BC Managed Care – PPO

## 2018-09-10 ENCOUNTER — Encounter: Payer: Self-pay | Admitting: Internal Medicine

## 2018-09-10 ENCOUNTER — Ambulatory Visit (INDEPENDENT_AMBULATORY_CARE_PROVIDER_SITE_OTHER): Payer: BC Managed Care – PPO | Admitting: Internal Medicine

## 2018-09-10 VITALS — BP 124/76 | HR 58 | Temp 98.0°F | Ht 73.0 in | Wt 353.0 lb

## 2018-09-10 DIAGNOSIS — Z0000 Encounter for general adult medical examination without abnormal findings: Secondary | ICD-10-CM | POA: Diagnosis not present

## 2018-09-10 DIAGNOSIS — Z23 Encounter for immunization: Secondary | ICD-10-CM | POA: Diagnosis not present

## 2018-09-10 LAB — URINALYSIS
Bilirubin Urine: NEGATIVE
Hgb urine dipstick: NEGATIVE
KETONES UR: NEGATIVE
Leukocytes, UA: NEGATIVE
Nitrite: NEGATIVE
Specific Gravity, Urine: 1.015 (ref 1.000–1.030)
Total Protein, Urine: NEGATIVE
UROBILINOGEN UA: 0.2 (ref 0.0–1.0)
Urine Glucose: NEGATIVE
pH: 6.5 (ref 5.0–8.0)

## 2018-09-10 LAB — BASIC METABOLIC PANEL
BUN: 17 mg/dL (ref 6–23)
CO2: 26 mEq/L (ref 19–32)
Calcium: 9.5 mg/dL (ref 8.4–10.5)
Chloride: 100 mEq/L (ref 96–112)
Creatinine, Ser: 0.96 mg/dL (ref 0.40–1.50)
GFR: 85.33 mL/min (ref >60.00)
Glucose, Bld: 87 mg/dL (ref 70–99)
Potassium: 3.9 mEq/L (ref 3.5–5.1)
Sodium: 135 mEq/L (ref 135–145)

## 2018-09-10 LAB — CBC WITH DIFFERENTIAL/PLATELET
BASOS ABS: 0.1 10*3/uL (ref 0.0–0.1)
Basophils Relative: 1 % (ref 0.0–3.0)
Eosinophils Absolute: 0.2 10*3/uL (ref 0.0–0.7)
Eosinophils Relative: 2.8 % (ref 0.0–5.0)
HCT: 44.5 % (ref 39.0–52.0)
Hemoglobin: 15.3 g/dL (ref 13.0–17.0)
Lymphocytes Relative: 25.1 % (ref 12.0–46.0)
Lymphs Abs: 1.7 10*3/uL (ref 0.7–4.0)
MCHC: 34.4 g/dL (ref 30.0–36.0)
MCV: 88.7 fl (ref 78.0–100.0)
Monocytes Absolute: 0.7 10*3/uL (ref 0.1–1.0)
Monocytes Relative: 10.4 % (ref 3.0–12.0)
NEUTROS PCT: 60.7 % (ref 43.0–77.0)
Neutro Abs: 4.2 10*3/uL (ref 1.4–7.7)
Platelets: 282 10*3/uL (ref 150.0–400.0)
RBC: 5.02 Mil/uL (ref 4.22–5.81)
RDW: 13.7 % (ref 11.5–15.5)
WBC: 6.9 10*3/uL (ref 4.0–10.5)

## 2018-09-10 LAB — LIPID PANEL
Cholesterol: 164 mg/dL (ref 0–200)
HDL: 39.1 mg/dL (ref >39.00)
LDL Cholesterol: 104 mg/dL — ABNORMAL HIGH (ref 0–99)
NonHDL: 125
Total CHOL/HDL Ratio: 4
Triglycerides: 104 mg/dL (ref 0.0–149.0)
VLDL: 20.8 mg/dL (ref 0.0–40.0)

## 2018-09-10 LAB — HEPATIC FUNCTION PANEL
ALT: 35 U/L (ref 0–53)
AST: 18 U/L (ref 0–37)
Albumin: 4.3 g/dL (ref 3.5–5.2)
Alkaline Phosphatase: 49 U/L (ref 39–117)
BILIRUBIN TOTAL: 0.7 mg/dL (ref 0.2–1.2)
Bilirubin, Direct: 0.1 mg/dL (ref 0.0–0.3)
Total Protein: 6.6 g/dL (ref 6.0–8.3)

## 2018-09-10 LAB — PSA: PSA: 0.53 ng/mL (ref 0.10–4.00)

## 2018-09-10 LAB — TSH: TSH: 1.6 u[IU]/mL (ref 0.35–4.50)

## 2018-09-10 NOTE — Addendum Note (Signed)
Addended by: Scarlett PrestoFRIEDENBACH, Takila Kronberg on: 09/10/2018 01:52 PM   Modules accepted: Orders

## 2018-09-10 NOTE — Progress Notes (Signed)
Subjective:  Patient ID: Randy [name], male    DOB: 08/07/1961  Age: 57 y.o. MRN: [id number]  CC: No chief complaint on file.   HPI Randy FaceJohn S Mumme presents for a well exam  C/o hearing loss  Outpatient Medications Prior to Visit  Medication Sig Dispense Refill  . amLODipine (NORVASC) 5 MG tablet Take 1 tablet (5 mg total) by mouth daily. 90 tablet 3  . aspirin EC 325 MG tablet Take 1 tablet (325 mg total) by mouth 2 (two) times daily. 60 tablet 0  . benazepril (LOTENSIN) 20 MG tablet Take 1 tablet (20 mg total) by mouth daily. 90 tablet 3  . Cholecalciferol (VITAMIN D-3) 1000 units CAPS Take 1 capsule by mouth every morning.    . Coenzyme Q10 (COQ10) 100 MG CAPS Take 1 capsule by mouth every morning.    . Cyanocobalamin (B-12) 2000 MCG TABS Take 1 tablet by mouth every morning.    Marland Kitchen. levothyroxine (SYNTHROID, LEVOTHROID) 125 MCG tablet TAKE TWO TABLETS BY MOUTH ONCE DAILY BEFORE BREAKFAST 180 tablet 3  . lovastatin (MEVACOR) 40 MG tablet Take 1 tablet (40 mg total) by mouth at bedtime. Must keep Nov appt for future refills 90 tablet 3  . Multiple Vitamin (MULTIVITAMIN) tablet Take 1 tablet by mouth every morning.    . Omega-3 Fatty Acids (FISH OIL) 1000 MG CAPS Take 1 capsule by mouth every morning.    . triamterene-hydrochlorothiazide (MAXZIDE-25) 37.5-25 MG tablet TAKE 1 TABLET BY MOUTH ONCE DAILY 90 tablet 3  . Turmeric 500 MG TABS Take 1 capsule by mouth every morning.     No facility-administered medications prior to visit.     ROS: Review of Systems  Constitutional: Negative for appetite change, fatigue and unexpected weight change.  HENT: Positive for hearing loss. Negative for congestion, nosebleeds, sneezing, sore throat and trouble swallowing.   Eyes: Negative for itching and visual disturbance.  Respiratory: Negative for cough.   Cardiovascular: Negative for chest pain, palpitations and leg swelling.  Gastrointestinal: Negative for abdominal distention, blood in stool,  diarrhea and nausea.  Genitourinary: Negative for frequency and hematuria.  Musculoskeletal: Negative for back pain, gait problem, joint swelling and neck pain.  Skin: Negative for rash.  Neurological: Negative for dizziness, tremors, speech difficulty and weakness.  Psychiatric/Behavioral: Negative for agitation, dysphoric mood and sleep disturbance. The patient is not nervous/anxious.     Objective:  BP 124/76 (BP Location: Left Arm, Patient Position: Sitting, Cuff Size: Large)   Pulse (!) 58   Temp 98 F (36.7 C) (Oral)   Ht 6\' 1"  (1.854 m)   Wt (!) 353 lb (160.1 kg)   SpO2 95%   BMI 46.57 kg/m   BP Readings from Last 3 Encounters:  09/10/18 124/76  03/30/18 128/74  10/18/17 122/70    Wt Readings from Last 3 Encounters:  09/10/18 (!) 353 lb (160.1 kg)  03/30/18 (!) 348 lb (157.9 kg)  10/18/17 (!) 330 lb (149.7 kg)    Physical Exam Constitutional:      General: He is not in acute distress.    Appearance: He is well-developed.     Comments: NAD  Eyes:     Conjunctiva/sclera: Conjunctivae normal.     Pupils: Pupils are equal, round, and reactive to light.  Neck:     Musculoskeletal: Normal range of motion.     Thyroid: No thyromegaly.     Vascular: No JVD.  Cardiovascular:     Rate and Rhythm: Normal rate and regular  rhythm.     Heart sounds: Normal heart sounds. No murmur. No friction rub. No gallop.   Pulmonary:     Effort: Pulmonary effort is normal. No respiratory distress.     Breath sounds: Normal breath sounds. No wheezing or rales.  Chest:     Chest wall: No tenderness.  Abdominal:     General: Bowel sounds are normal. There is no distension.     Palpations: Abdomen is soft. There is no mass.     Tenderness: There is no abdominal tenderness. There is no guarding or rebound.  Genitourinary:    Prostate: Normal.     Rectum: Normal. Guaiac result negative.  Musculoskeletal: Normal range of motion.        General: No tenderness.  Lymphadenopathy:      Cervical: No cervical adenopathy.  Skin:    General: Skin is warm and dry.     Findings: No rash.  Neurological:     Mental Status: He is alert and oriented to person, place, and time.     Cranial Nerves: No cranial nerve deficit.     Motor: No abnormal muscle tone.     Coordination: Coordination normal.     Gait: Gait normal.     Deep Tendon Reflexes: Reflexes are normal and symmetric.  Psychiatric:        Behavior: Behavior normal.        Thought Content: Thought content normal.        Judgment: Judgment normal.   Obese  Lab Results  Component Value Date   WBC 4.8 07/24/2017   HGB 14.8 07/24/2017   HCT 43.7 07/24/2017   PLT 273.0 07/24/2017   GLUCOSE 98 03/30/2018   CHOL 127 07/24/2017   TRIG 76.0 07/24/2017   HDL 32.70 (L) 07/24/2017   LDLCALC 79 07/24/2017   ALT 30 07/24/2017   AST 18 07/24/2017   NA 141 03/30/2018   K 3.7 03/30/2018   CL 105 03/30/2018   CREATININE 1.28 03/30/2018   BUN 22 03/30/2018   CO2 30 03/30/2018   TSH 0.75 03/30/2018   PSA 0.56 07/24/2017   HGBA1C 5.4 08/16/2013    No results found.  Assessment & Plan:   There are no diagnoses linked to this encounter.   No orders of the defined types were placed in this encounter.    Follow-up: No follow-ups on file.  Sonda PrimesAlex Maimouna Rondeau, MD

## 2018-09-10 NOTE — Assessment & Plan Note (Addendum)
We discussed age appropriate health related issues, including available/recomended screening tests and vaccinations. We discussed a need for adhering to healthy diet and exercise. Labs/EKG were reviewed/ordered. All questions were answered.  Cologuard - will arrange; last colon 2004 w/a 7 mm polyp pt will not be able to do colonoscopy before summer [facility]  Offered cardiac CT scan for calcium scoring Hearing test - ?[facility] offered

## 2018-09-10 NOTE — Patient Instructions (Signed)

## 2019-01-01 ENCOUNTER — Ambulatory Visit: Payer: BC Managed Care – PPO

## 2019-02-22 ENCOUNTER — Ambulatory Visit: Payer: BC Managed Care – PPO

## 2019-03-05 ENCOUNTER — Ambulatory Visit: Payer: BC Managed Care – PPO

## 2019-03-07 ENCOUNTER — Other Ambulatory Visit: Payer: Self-pay

## 2019-03-07 ENCOUNTER — Ambulatory Visit (INDEPENDENT_AMBULATORY_CARE_PROVIDER_SITE_OTHER): Payer: BC Managed Care – PPO | Admitting: Internal Medicine

## 2019-03-07 ENCOUNTER — Encounter: Payer: Self-pay | Admitting: Internal Medicine

## 2019-03-07 VITALS — BP 130/84 | HR 77 | Temp 98.2°F | Ht 73.0 in | Wt 345.0 lb

## 2019-03-07 DIAGNOSIS — Z23 Encounter for immunization: Secondary | ICD-10-CM | POA: Diagnosis not present

## 2019-03-07 DIAGNOSIS — I10 Essential (primary) hypertension: Secondary | ICD-10-CM

## 2019-03-07 DIAGNOSIS — Z0000 Encounter for general adult medical examination without abnormal findings: Secondary | ICD-10-CM

## 2019-03-07 DIAGNOSIS — E034 Atrophy of thyroid (acquired): Secondary | ICD-10-CM | POA: Diagnosis not present

## 2019-03-07 DIAGNOSIS — E785 Hyperlipidemia, unspecified: Secondary | ICD-10-CM | POA: Diagnosis not present

## 2019-03-07 MED ORDER — TRIAMTERENE-HCTZ 37.5-25 MG PO TABS: ORAL_TABLET | ORAL | 3 refills | Status: DC

## 2019-03-07 MED ORDER — BENAZEPRIL HCL 20 MG PO TABS: 20.0000 mg | ORAL_TABLET | Freq: Every day | ORAL | 3 refills | Status: DC

## 2019-03-07 MED ORDER — LEVOTHYROXINE SODIUM 125 MCG PO TABS: ORAL_TABLET | ORAL | 3 refills | Status: DC

## 2019-03-07 MED ORDER — LOVASTATIN 40 MG PO TABS: 40.0000 mg | ORAL_TABLET | Freq: Every day | ORAL | 3 refills | Status: DC

## 2019-03-07 MED ORDER — AMLODIPINE BESYLATE 5 MG PO TABS: 5.0000 mg | ORAL_TABLET | Freq: Every day | ORAL | 3 refills | Status: DC

## 2019-03-07 NOTE — Assessment & Plan Note (Signed)
Lotrel, Maxzide

## 2019-03-07 NOTE — Assessment & Plan Note (Signed)
Wt Readings from Last 3 Encounters:  03/07/19 (!) 345 lb (156.5 kg)  09/10/18 (!) 353 lb (160.1 kg)  03/30/18 (!) 348 lb (157.9 kg)

## 2019-03-07 NOTE — Assessment & Plan Note (Signed)
Labs

## 2019-03-07 NOTE — Progress Notes (Signed)
Subjective:  Patient ID: Randy [name], male    DOB: 04-11-1961  Age: 58 y.o. MRN: [id number]  CC: No chief complaint on file.   HPI ZAKARY KIMURA presents for  HTN, dyslipidemia, hypothyroidism  Outpatient Medications Prior to Visit  Medication Sig Dispense Refill  . amLODipine (NORVASC) 5 MG tablet Take 1 tablet (5 mg total) by mouth daily. 90 tablet 3  . aspirin EC 325 MG tablet Take 1 tablet (325 mg total) by mouth 2 (two) times daily. 60 tablet 0  . benazepril (LOTENSIN) 20 MG tablet Take 1 tablet (20 mg total) by mouth daily. 90 tablet 3  . Cholecalciferol (VITAMIN D-3) 1000 units CAPS Take 1 capsule by mouth every morning.    . Coenzyme Q10 (COQ10) 100 MG CAPS Take 1 capsule by mouth every morning.    . Cyanocobalamin (B-12) 2000 MCG TABS Take 1 tablet by mouth every morning.    Marland Kitchen levothyroxine (SYNTHROID, LEVOTHROID) 125 MCG tablet TAKE TWO TABLETS BY MOUTH ONCE DAILY BEFORE BREAKFAST 180 tablet 3  . lovastatin (MEVACOR) 40 MG tablet Take 1 tablet (40 mg total) by mouth at bedtime. Must keep Nov appt for future refills 90 tablet 3  . Multiple Vitamin (MULTIVITAMIN) tablet Take 1 tablet by mouth every morning.    . Omega-3 Fatty Acids (FISH OIL) 1000 MG CAPS Take 1 capsule by mouth every morning.    . triamterene-hydrochlorothiazide (MAXZIDE-25) 37.5-25 MG tablet TAKE 1 TABLET BY MOUTH ONCE DAILY 90 tablet 3  . Turmeric 500 MG TABS Take 1 capsule by mouth every morning.     No facility-administered medications prior to visit.     ROS: Review of Systems  Constitutional: Negative for appetite change, fatigue and unexpected weight change.  HENT: Negative for congestion, nosebleeds, sneezing, sore throat and trouble swallowing.   Eyes: Negative for itching and visual disturbance.  Respiratory: Negative for cough.   Cardiovascular: Negative for chest pain, palpitations and leg swelling.  Gastrointestinal: Negative for abdominal distention, blood in stool, diarrhea and nausea.   Genitourinary: Negative for frequency and hematuria.  Musculoskeletal: Negative for back pain, gait problem, joint swelling and neck pain.  Skin: Negative for rash.  Neurological: Negative for dizziness, tremors, speech difficulty and weakness.  Psychiatric/Behavioral: Negative for agitation, dysphoric mood, sleep disturbance and suicidal ideas. The patient is not nervous/anxious.     Objective:  BP 130/84 (BP Location: Left Arm, Patient Position: Sitting, Cuff Size: Large)   Pulse 77   Temp 98.2 F (36.8 C) (Oral)   Ht 6\' 1"  (1.854 m)   Wt (!) 345 lb (156.5 kg)   SpO2 96%   BMI 45.52 kg/m   BP Readings from Last 3 Encounters:  03/07/19 130/84  09/10/18 124/76  03/30/18 128/74    Wt Readings from Last 3 Encounters:  03/07/19 (!) 345 lb (156.5 kg)  09/10/18 (!) 353 lb (160.1 kg)  03/30/18 (!) 348 lb (157.9 kg)    Physical Exam Constitutional:      General: He is not in acute distress.    Appearance: He is well-developed.     Comments: NAD  Eyes:     Conjunctiva/sclera: Conjunctivae normal.     Pupils: Pupils are equal, round, and reactive to light.  Neck:     Musculoskeletal: Normal range of motion.     Thyroid: No thyromegaly.     Vascular: No JVD.  Cardiovascular:     Rate and Rhythm: Normal rate and regular rhythm.     Heart  sounds: Normal heart sounds. No murmur. No friction rub. No gallop.   Pulmonary:     Effort: Pulmonary effort is normal. No respiratory distress.     Breath sounds: Normal breath sounds. No wheezing or rales.  Chest:     Chest wall: No tenderness.  Abdominal:     General: Bowel sounds are normal. There is no distension.     Palpations: Abdomen is soft. There is no mass.     Tenderness: There is no abdominal tenderness. There is no guarding or rebound.  Musculoskeletal: Normal range of motion.        General: No tenderness.  Lymphadenopathy:     Cervical: No cervical adenopathy.  Skin:    General: Skin is warm and dry.     Findings:  No rash.  Neurological:     Mental Status: He is alert and oriented to person, place, and time.     Cranial Nerves: No cranial nerve deficit.     Motor: No abnormal muscle tone.     Coordination: Coordination normal.     Gait: Gait normal.     Deep Tendon Reflexes: Reflexes are normal and symmetric.  Psychiatric:        Behavior: Behavior normal.        Thought Content: Thought content normal.        Judgment: Judgment normal.   L knee scar obese  Lab Results  Component Value Date   WBC 6.9 09/10/2018   HGB 15.3 09/10/2018   HCT 44.5 09/10/2018   PLT 282.0 09/10/2018   GLUCOSE 87 09/10/2018   CHOL 164 09/10/2018   TRIG 104.0 09/10/2018   HDL 39.10 09/10/2018   LDLCALC 104 (H) 09/10/2018   ALT 35 09/10/2018   AST 18 09/10/2018   NA 135 09/10/2018   K 3.9 09/10/2018   CL 100 09/10/2018   CREATININE 0.96 09/10/2018   BUN 17 09/10/2018   CO2 26 09/10/2018   TSH 1.60 09/10/2018   PSA 0.53 09/10/2018   HGBA1C 5.4 08/16/2013    No results found.  Assessment & Plan:   Diagnoses and all orders for this visit:  Need for shingles vaccine -     Varicella-zoster vaccine IM (Shingrix)     No orders of the defined types were placed in this encounter.    Follow-up: No follow-ups on file.  Sonda PrimesAlex Plotnikov, MD

## 2019-03-07 NOTE — Patient Instructions (Addendum)
Wt Readings from Last 3 Encounters:  03/07/19 (!) 345 lb (156.5 kg)  09/10/18 (!) 353 lb (160.1 kg)  03/30/18 (!) 348 lb (157.9 kg)   Cardiac CT calcium scoring test $150   Computed tomography, more commonly known as a CT or CAT scan, is a diagnostic medical imaging test. Like traditional x-rays, it produces multiple images or pictures of the inside of the body. The cross-sectional images generated during a CT scan can be reformatted in multiple planes. They can even generate three-dimensional images. These images can be viewed on a computer monitor, printed on film or by a 3D printer, or transferred to a CD or DVD. CT images of internal organs, bones, soft tissue and blood vessels provide greater detail than traditional x-rays, particularly of soft tissues and blood vessels. A cardiac CT scan for coronary calcium is a non-invasive way of obtaining information about the presence, location and extent of calcified plaque in the coronary arteries-the vessels that supply oxygen-containing blood to the heart muscle. Calcified plaque results when there is a build-up of fat and other substances under the inner layer of the artery. This material can calcify which signals the presence of atherosclerosis, a disease of the vessel wall, also called coronary artery disease (CAD). People with this disease have an increased risk for heart attacks. In addition, over time, progression of plaque build up (CAD) can narrow the arteries or even close off blood flow to the heart. The result may be chest pain, sometimes called "angina," or a heart attack. Because calcium is a marker of CAD, the amount of calcium detected on a cardiac CT scan is a helpful prognostic tool. The findings on cardiac CT are expressed as a calcium score. Another name for this test is coronary artery calcium scoring.  What are some common uses of the procedure? The goal of cardiac CT scan for calcium scoring is to determine if CAD is present and to  what extent, even if there are no symptoms. It is a screening study that may be recommended by a physician for patients with risk factors for CAD but no clinical symptoms. The major risk factors for CAD are: . high blood cholesterol levels  . family history of heart attacks  . diabetes  . high blood pressure  . cigarette smoking  . overweight or obese  . physical inactivity   A negative cardiac CT scan for calcium scoring shows no calcification within the coronary arteries. This suggests that CAD is absent or so minimal it cannot be seen by this technique. The chance of having a heart attack over the next two to five years is very low under these circumstances. A positive test means that CAD is present, regardless of whether or not the patient is experiencing any symptoms. The amount of calcification-expressed as the calcium score-may help to predict the likelihood of a myocardial infarction (heart attack) in the coming years and helps your medical doctor or cardiologist decide whether the patient may need to take preventive medicine or undertake other measures such as diet and exercise to lower the risk for heart attack. The extent of CAD is graded according to your calcium score:  Calcium Score  Presence of CAD  0 No evidence of CAD   1-10 Minimal evidence of CAD  11-100 Mild evidence of CAD  101-400 Moderate evidence of CAD  Over 400 Extensive evidence of CAD

## 2019-05-15 ENCOUNTER — Ambulatory Visit: Payer: BC Managed Care – PPO | Admitting: Internal Medicine

## 2019-05-16 ENCOUNTER — Other Ambulatory Visit: Payer: Self-pay

## 2019-05-16 ENCOUNTER — Ambulatory Visit (INDEPENDENT_AMBULATORY_CARE_PROVIDER_SITE_OTHER): Payer: BC Managed Care – PPO | Admitting: Internal Medicine

## 2019-05-16 ENCOUNTER — Ambulatory Visit (INDEPENDENT_AMBULATORY_CARE_PROVIDER_SITE_OTHER)
Admission: RE | Admit: 2019-05-16 | Discharge: 2019-05-16 | Disposition: A | Discharge status: Home / Self Care | Payer: BC Managed Care – PPO | Source: Ambulatory Visit | Attending: Internal Medicine | Admitting: Internal Medicine

## 2019-05-16 ENCOUNTER — Encounter: Payer: Self-pay | Admitting: Internal Medicine

## 2019-05-16 VITALS — BP 160/80 | HR 86 | Temp 98.8°F | Ht 73.0 in | Wt 350.0 lb

## 2019-05-16 DIAGNOSIS — M546 Pain in thoracic spine: Secondary | ICD-10-CM

## 2019-05-16 DIAGNOSIS — Z23 Encounter for immunization: Secondary | ICD-10-CM

## 2019-05-16 DIAGNOSIS — M25511 Pain in right shoulder: Secondary | ICD-10-CM

## 2019-05-16 DIAGNOSIS — M25512 Pain in left shoulder: Secondary | ICD-10-CM

## 2019-05-16 DIAGNOSIS — F4321 Adjustment disorder with depressed mood: Secondary | ICD-10-CM | POA: Insufficient documentation

## 2019-05-16 DIAGNOSIS — M79672 Pain in left foot: Secondary | ICD-10-CM

## 2019-05-16 DIAGNOSIS — E291 Testicular hypofunction: Secondary | ICD-10-CM

## 2019-05-16 MED ORDER — MELOXICAM 15 MG PO TABS: 15.0000 mg | ORAL_TABLET | Freq: Every day | ORAL | 0 refills | Status: DC

## 2019-05-16 NOTE — Assessment & Plan Note (Signed)
Diet/fasting discussed 

## 2019-05-16 NOTE — Patient Instructions (Addendum)
Ice Good shoes   These suggestions will probably help you to improve your metabolism if you are not overweight and to lose weight if you are overweight: 1.  Reduce your consumption of sugars and starches.  Eliminate high fructose corn syrup from your diet.  Reduce your consumption of processed foods.  For desserts try to have seasonal fruits, berries, nuts, cheeses or dark chocolate with more than 70% cacao. 2.  Do not snack 3.  You do not have to eat breakfast.  If you choose to have breakfast-eat plain greek yogurt, eggs, oatmeal (without sugar) 4.  Drink water, freshly brewed unsweetened tea (green, black or herbal) or coffee.  Do not drink sodas including diet sodas , juices, beverages sweetened with artificial sweeteners. 5.  Reduce your consumption of refined grains. 6.  Avoid protein drinks such as Optifast, Slim fast etc. Eat chicken, fish, meat, dairy and beans for your sources of protein 7.  Natural unprocessed fats like cold pressed virgin olive oil, butter, coconut oil are good for you.  Eat avocados 8.  Increase your consumption of fiber.  Fruits, berries, vegetables, whole grains, flaxseeds, Chia seeds, beans, popcorn, nuts, oatmeal are good sources of fiber 9.  Use vinegar in your diet, i.e. apple cider vinegar, red wine or balsamic vinegar 10.  You can try fasting.  For example you can skip breakfast and lunch every other day (24-hour fast) 11.  Stress reduction, good night sleep, relaxation, meditation, yoga and other physical activity is likely to help you to maintain low weight too. 12.  If you drink alcohol, limit your alcohol intake to no more than 2 drinks a day.   Mediterranean diet is good for you. ([employer] Bosworth has a typical Mediterranean cuisine menu) The Mediterranean diet is a way of eating based on the traditional cuisine of countries bordering the [employer]. While there is no single definition of the Mediterranean diet, it is typically high in vegetables,  fruits, whole grains, beans, nut and seeds, and olive oil. The main components of Mediterranean diet include: Marland Kitchen Daily consumption of vegetables, fruits, whole grains and healthy fats  . Weekly intake of fish, poultry, beans and eggs  . Moderate portions of dairy products  . Limited intake of red meat Other important elements of the Mediterranean diet are sharing meals with family and friends, enjoying a glass of red wine and being physically active. Health benefits of a Mediterranean diet: A traditional Mediterranean diet consisting of large quantities of fresh fruits and vegetables, nuts, fish and olive oil-coupled with physical activity-can reduce your risk of serious mental and physical health problems by: Preventing heart disease and strokes. Following a Mediterranean diet limits your intake of refined breads, processed foods, and red meat, and encourages drinking red wine instead of hard liquor-all factors that can help prevent heart disease and stroke. Keeping you agile. If you're an older adult, the nutrients gained with a Mediterranean diet may reduce your risk of developing muscle weakness and other signs of frailty by about 70 percent. Reducing the risk of Alzheimer's. Research suggests that the [place] diet may improve cholesterol, blood sugar levels, and overall blood vessel health, which in turn may reduce your risk of Alzheimer's disease or dementia. Halving the risk of Parkinson's disease. The high levels of antioxidants in the Mediterranean diet can prevent cells from undergoing a damaging process called oxidative stress, thereby cutting the risk of Parkinson's disease in half. Increasing longevity. By reducing your risk of developing heart disease or  cancer with the Mediterranean diet, you're reducing your risk of death at any age by 20%. Protecting against type 2 diabetes. A Mediterranean diet is rich in fiber which digests slowly, prevents huge swings in blood sugar, and can  help you maintain a healthy weight.    Cabbage soup recipe that will not make you gain weight: Take 1 small head of cabbage, 1 average pack of celery, 4 green peppers, 4 onions, 2 cans diced tomatoes (they are not available without salt), salt and spices to taste.  Chop cabbage, celery, peppers and onions.  And tomatoes and 2-2.5 liters (2.5 quarts) of water so that it would just cover the vegetables.  Bring to boil.  Add spices and salt.  Turn heat to low/medium and simmer for 20-25 minutes.  Naturally, you can make a smaller batch and change some of the ingredients.

## 2019-05-16 NOTE — Progress Notes (Signed)
Subjective:  Patient ID: Randy [name], male    DOB: 09/21/1960  Age: 58 y.o. MRN: [id number]  CC: Foot Pain (Left x 10 days)   HPI Randy FaceJohn S [name] presents for  Mother died 2 d ago C/o L lat foot pain - worse w/driving, walking - limp C/o lower thor back pain x 2 weeks - comes and goes  Outpatient Medications Prior to Visit  Medication Sig Dispense Refill  . amLODipine (NORVASC) 5 MG tablet Take 1 tablet (5 mg total) by mouth daily. 90 tablet 3  . aspirin EC 325 MG tablet Take 1 tablet (325 mg total) by mouth 2 (two) times daily. 60 tablet 0  . benazepril (LOTENSIN) 20 MG tablet Take 1 tablet (20 mg total) by mouth daily. 90 tablet 3  . Cholecalciferol (VITAMIN D-3) 1000 units CAPS Take 1 capsule by mouth every morning.    . Coenzyme Q10 (COQ10) 100 MG CAPS Take 1 capsule by mouth every morning.    . Cyanocobalamin (B-12) 2000 MCG TABS Take 1 tablet by mouth every morning.    Marland Kitchen. levothyroxine (SYNTHROID) 125 MCG tablet TAKE TWO TABLETS BY MOUTH ONCE DAILY BEFORE BREAKFAST 180 tablet 3  . lovastatin (MEVACOR) 40 MG tablet Take 1 tablet (40 mg total) by mouth at bedtime. Must keep Nov appt for future refills 90 tablet 3  . Multiple Vitamin (MULTIVITAMIN) tablet Take 1 tablet by mouth every morning.    . Omega-3 Fatty Acids (FISH OIL) 1000 MG CAPS Take 1 capsule by mouth every morning.    . triamterene-hydrochlorothiazide (MAXZIDE-25) 37.5-25 MG tablet TAKE 1 TABLET BY MOUTH ONCE DAILY 90 tablet 3  . Turmeric 500 MG TABS Take 1 capsule by mouth every morning.     No facility-administered medications prior to visit.     ROS: Review of Systems  Constitutional: Positive for unexpected weight change. Negative for appetite change and fatigue.  HENT: Negative for congestion, nosebleeds, sneezing, sore throat and trouble swallowing.   Eyes: Negative for itching and visual disturbance.  Respiratory: Negative for cough.   Cardiovascular: Negative for chest pain, palpitations and leg swelling.   Gastrointestinal: Negative for abdominal distention, blood in stool, diarrhea and nausea.  Genitourinary: Negative for frequency and hematuria.  Musculoskeletal: Positive for arthralgias, back pain and gait problem. Negative for joint swelling and neck pain.  Skin: Negative for rash.  Neurological: Negative for dizziness, tremors, speech difficulty and weakness.  Psychiatric/Behavioral: Negative for agitation, dysphoric mood, sleep disturbance and suicidal ideas. The patient is not nervous/anxious.     Objective:  Ht 6\' 1"  (1.854 m)   Wt (!) 350 lb (158.8 kg)   BMI 46.18 kg/m   BP Readings from Last 3 Encounters:  03/07/19 130/84  09/10/18 124/76  03/30/18 128/74    Wt Readings from Last 3 Encounters:  05/16/19 (!) 350 lb (158.8 kg)  03/07/19 (!) 345 lb (156.5 kg)  09/10/18 (!) 353 lb (160.1 kg)    Physical Exam Constitutional:      General: He is not in acute distress.    Appearance: He is well-developed.     Comments: NAD  Eyes:     Conjunctiva/sclera: Conjunctivae normal.     Pupils: Pupils are equal, round, and reactive to light.  Neck:     Musculoskeletal: Normal range of motion.     Thyroid: No thyromegaly.     Vascular: No JVD.  Cardiovascular:     Rate and Rhythm: Normal rate and regular rhythm.  Heart sounds: Normal heart sounds. No murmur. No friction rub. No gallop.   Pulmonary:     Effort: Pulmonary effort is normal. No respiratory distress.     Breath sounds: Normal breath sounds. No wheezing or rales.  Chest:     Chest wall: No tenderness.  Abdominal:     General: Bowel sounds are normal. There is no distension.     Palpations: Abdomen is soft. There is no mass.     Tenderness: There is no abdominal tenderness. There is no guarding or rebound.  Musculoskeletal: Normal range of motion.        General: Tenderness present.  Lymphadenopathy:     Cervical: No cervical adenopathy.  Skin:    General: Skin is warm and dry.     Findings: No rash.   Neurological:     Mental Status: He is alert and oriented to person, place, and time.     Cranial Nerves: No cranial nerve deficit.     Motor: No abnormal muscle tone.     Coordination: Coordination normal.     Gait: Gait normal.     Deep Tendon Reflexes: Reflexes are normal and symmetric.  Psychiatric:        Behavior: Behavior normal.        Thought Content: Thought content normal.        Judgment: Judgment normal.   L shoulder pain w/ROM Lower thor painful L lat foot - painful  Lab Results  Component Value Date   WBC 6.9 09/10/2018   HGB 15.3 09/10/2018   HCT 44.5 09/10/2018   PLT 282.0 09/10/2018   GLUCOSE 87 09/10/2018   CHOL 164 09/10/2018   TRIG 104.0 09/10/2018   HDL 39.10 09/10/2018   LDLCALC 104 (H) 09/10/2018   ALT 35 09/10/2018   AST 18 09/10/2018   NA 135 09/10/2018   K 3.9 09/10/2018   CL 100 09/10/2018   CREATININE 0.96 09/10/2018   BUN 17 09/10/2018   CO2 26 09/10/2018   TSH 1.60 09/10/2018   PSA 0.53 09/10/2018   HGBA1C 5.4 08/16/2013    No results found.  Assessment & Plan:   There are no diagnoses linked to this encounter.   No orders of the defined types were placed in this encounter.    Follow-up: No follow-ups on file.  Walker Kehr, MD

## 2019-05-16 NOTE — Assessment & Plan Note (Signed)
Mother died on 05/14/19 Coping ok

## 2019-05-16 NOTE — Assessment & Plan Note (Addendum)
?  plantar fasciitis - lateral bands Ice  New shoes X ray Sports Med ref

## 2019-05-16 NOTE — Assessment & Plan Note (Addendum)
X ray: Wedging of T11 Recheck testosterone level and vitamin D Obtain bone density scan Meloxicam

## 2019-05-16 NOTE — Assessment & Plan Note (Signed)
ROM exercises Sports med ref Meloxicam

## 2019-05-17 NOTE — Assessment & Plan Note (Signed)
wedging of T11 vertebra on the x-ray Recheck testosterone level and vitamin D

## 2019-05-17 NOTE — Addendum Note (Signed)
Addended by: Cassandria Anger on: 05/17/2019 01:04 PM   Modules accepted: Orders

## 2019-05-27 ENCOUNTER — Ambulatory Visit: Payer: BC Managed Care – PPO | Admitting: Internal Medicine

## 2019-06-21 ENCOUNTER — Ambulatory Visit: Payer: BC Managed Care – PPO | Admitting: Family Medicine

## 2019-07-05 ENCOUNTER — Telehealth: Payer: Self-pay | Admitting: Internal Medicine

## 2019-07-05 NOTE — Telephone Encounter (Signed)
Patient is needing to be scheduled for a Bone Density.  Due to his weight, he is unable to have it done here at [facility].  Can these orders be placed at another facility?

## 2019-07-08 NOTE — Telephone Encounter (Signed)
Pt scheduled at the [facility] - left detailed msg on vm

## 2019-07-26 ENCOUNTER — Ambulatory Visit (INDEPENDENT_AMBULATORY_CARE_PROVIDER_SITE_OTHER): Payer: BC Managed Care – PPO | Admitting: Family Medicine

## 2019-07-26 ENCOUNTER — Encounter: Payer: Self-pay | Admitting: Family Medicine

## 2019-07-26 ENCOUNTER — Ambulatory Visit: Payer: Self-pay

## 2019-07-26 VITALS — BP 126/82 | HR 83 | Ht 73.0 in | Wt 350.0 lb

## 2019-07-26 DIAGNOSIS — M25512 Pain in left shoulder: Secondary | ICD-10-CM

## 2019-07-26 DIAGNOSIS — M79672 Pain in left foot: Secondary | ICD-10-CM

## 2019-07-26 DIAGNOSIS — G8929 Other chronic pain: Secondary | ICD-10-CM

## 2019-07-26 NOTE — Patient Instructions (Addendum)
Good to see you  Ice is your friend Tart cherry extract any dose at night  pennsaid pinkie amount topically 2 times daily as needed.  Keep hands within peripheral vision  S[employer]co orthotics "total support" online would be great  Avoid being barefoot  See me again in 4-6 weeks

## 2019-07-26 NOTE — Assessment & Plan Note (Signed)
Patient is having more of a posterior impingement that I think is secondary to the ergonomics with his home setting.  We discussed changing this.  Patient has no signs of any weakness noted.  Do not feel any injection is needed or formal physical therapy.  Home exercises given and work with Product/process development scientist.  Trial of topical anti-inflammatories.  Follow-up again in 4 to 6 weeks

## 2019-07-26 NOTE — Progress Notes (Signed)
Randy ScaleZach Jojuan [name] D.O. [facility] [address] [place], [place] [address] Phone: [phone] Subjective:   Randy [name], Randy [name], am serving as a scribe for Dr. Antoine PrimasZachary Feliciana [name].  I'm seeing this patient by the request  of:  Plotnikov, Georgina QuintAleksei V, MD   CC: Left shoulder pain  BJY:[id number]:Subjective  Randy [name] is a 58 y.o. male coming in with complaint of left shoulder pain for 2 months. 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 Has no issue with flexion. Sharp pain occurs with extension in posterior shoulder but goes away within seconds of returning his arm to his side. Has trouble lying on left shoulder at night. Has not had to use any medication due to not having pain rest.   Also complaining of left plantar fasciitis. Has seen Dr. Posey ReaPlotnikov for issue. Feels he is improving but wants to know what he can do to prevent pain. Did buy some New Balance shoes which have helped.    Past Medical History:  Diagnosis Date  . At risk for sleep apnea    STOP-BANG= 6         SENT TO PCP 06-24-2016  . History of thyroid nodule    s/p  right thyroid lopectomy--  per path report:  follicular adenoma, chronic thyroiditis  . Hyperlipidemia   . Hypertension   . Hypogonadism male   . Hypothyroidism   . Other specified disorders of tendon, right knee    quad tendon tear  . Wears glasses    Past Surgical History:  Procedure Laterality Date  . COLONOSCOPY  2006  . EYE SURGERY Right age 58   removal glass  . HYPOSPADIAS CORRECTION  age 89  . QUADRICEPS TENDON REPAIR Left 06/28/2016   Procedure: LEFT KNEE REPAIR QUADRICEP TENDON;  Surgeon: Eugenia Mcalpineobert Collins, MD;  [facility];  Service: Orthopedics;  Laterality: Left;  . THYROID LOBECTOMY Right 02/03/2005   Social History   Socioeconomic History  . Marital status: Divorced    Spouse name: Not on file  . Number of children: Not on file  . Years of education: Not on file  . Highest education  level: Not on file  Occupational History  . Not on file  Social Needs  . Financial resource strain: Not on file  . Food insecurity    Worry: Not on file    Inability: Not on file  . Transportation needs    Medical: Not on file    Non-medical: Not on file  Tobacco Use  . Smoking status: Never Smoker  . Smokeless tobacco: Never Used  Substance and Sexual Activity  . Alcohol use: Yes    Comment: occasional  . Drug use: No  . Sexual activity: Not on file  Lifestyle  . Physical activity    Days per week: Not on file    Minutes per session: Not on file  . Stress: Not on file  Relationships  . Social Musicianconnections    Talks on phone: Not on file    Gets together: Not on file    Attends religious service: Not on file    Active member of club or organization: Not on file    Attends meetings of clubs or organizations: Not on file    Relationship status: Not on file  Other Topics Concern  . Not on file  Social History Narrative  . Not on file   No Known Allergies Family History  Problem  Relation Age of Onset  . Mental illness Mother        dementia  . Arthritis Father   . COPD Father     Current Outpatient Medications (Endocrine & Metabolic):  .  levothyroxine (SYNTHROID) 125 MCG tablet, TAKE TWO TABLETS BY MOUTH ONCE DAILY BEFORE BREAKFAST  Current Outpatient Medications (Cardiovascular):  .  amLODipine (NORVASC) 5 MG tablet, Take 1 tablet (5 mg total) by mouth daily. .  benazepril (LOTENSIN) 20 MG tablet, Take 1 tablet (20 mg total) by mouth daily. Marland Kitchen  lovastatin (MEVACOR) 40 MG tablet, Take 1 tablet (40 mg total) by mouth at bedtime. Must keep Nov appt for future refills .  triamterene-hydrochlorothiazide (MAXZIDE-25) 37.5-25 MG tablet, TAKE 1 TABLET BY MOUTH ONCE DAILY   Current Outpatient Medications (Analgesics):  .  aspirin EC 325 MG tablet, Take 1 tablet (325 mg total) by mouth 2 (two) times daily. .  meloxicam (MOBIC) 15 MG tablet, Take 1 tablet (15 mg total) by  mouth daily.  Current Outpatient Medications (Hematological):  Marland Kitchen  Cyanocobalamin (B-12) 2000 MCG TABS, Take 1 tablet by mouth every morning.  Current Outpatient Medications (Other):  Marland Kitchen  Cholecalciferol (VITAMIN D-3) 1000 units CAPS, Take 1 capsule by mouth every morning. .  Coenzyme Q10 (COQ10) 100 MG CAPS, Take 1 capsule by mouth every morning. .  Multiple Vitamin (MULTIVITAMIN) tablet, Take 1 tablet by mouth every morning. .  Omega-3 Fatty Acids (FISH OIL) 1000 MG CAPS, Take 1 capsule by mouth every morning. .  Turmeric 500 MG TABS, Take 1 capsule by mouth every morning.    Past medical history, social, surgical and family history all reviewed in electronic medical record.  No pertanent information unless stated regarding to the chief complaint.   Review of Systems:  No headache, visual changes, nausea, vomiting, diarrhea, constipation, dizziness, abdominal pain, skin rash, fevers, chills, night sweats, weight loss, swollen lymph nodes, body aches, joint swelling, muscle aches, chest pain, shortness of breath, mood changes.   Objective  Blood pressure 126/82, pulse 83, height 6\' 1"  (1.854 m), weight (!) 350 lb (158.8 kg), SpO2 97 %.    General: No apparent distress alert and oriented x3 mood and affect normal, dressed appropriately.  Morbidly obese HEENT: Pupils equal, extraocular movements intact  Respiratory: Patient's speak in full sentences and does not appear short of breath  Cardiovascular: No lower extremity edema, non tender, no erythema  Skin: Warm dry intact with no signs of infection or rash on extremities or on axial skeleton.  Abdomen: Soft nontender  Neuro: Cranial nerves II through XII are intact, neurovascularly intact in all extremities with 2+ DTRs and 2+ pulses.  Lymph: No lymphadenopathy of posterior or anterior cervical chain or axillae bilaterally.  Gait antalgic MSK:  Non tender with full range of motion and good stability and symmetric strength and tone of ,  elbows, wrist, hip, knee and bilaterally.  Left shoulder exam shows the patient does have full range of motion.  No atrophy on inspection.  Patient does have very mild pain with external rotation.  No pain with internal rotation.  5 out of 5 strength the rotator cuff but does seem to be somewhat uncomfortable.  Left foot exam shows the patient has more pain over the fifth metatarsal and mild over the medial calcaneal region.  Significant pes planus with overpronation of the hindfoot.  Nontender in the forefoot.  Mild limited range of motion of the ankle secondary to patient's body habitus  97110; 15 additional  minutes spent for Therapeutic exercises as stated in above notes.  This included exercises focusing on stretching, strengthening, with significant focus on eccentric aspects.   Long term goals include an improvement in range of motion, strength, endurance as well as avoiding reinjury. Patient's frequency would include in 1-2 times a day, 3-5 times a week for a duration of 6-12 weeks. Shoulder Exercises that included:  Basic scapular stabilization to include adduction and depression of scapula Scaption, focusing on proper movement and good control Internal and External rotation utilizing a theraband, with elbow tucked at side entire time Rows with theraband which was givne   Proper technique shown and discussed handout in great detail with ATC.  All questions were discussed and answered.     Impression and Recommendations:     This case required medical decision making of moderate complexity. The above documentation has been reviewed and is accurate and complete Judi Saa, DO       Note: This dictation was prepared with Dragon dictation along with smaller phrase technology. Any transcriptional errors that result from this process are unintentional.

## 2019-07-26 NOTE — Assessment & Plan Note (Signed)
Patient is more of the pain over the fifth metatarsal that I am concerned is secondary to patient's body habitus. Patient has made improvement with the new shoes and we discussed over-the-counter orthotics, some mild exercises given to help with stretching.  We discussed unloading the lateral aspect of the shoes by a different lacing.  Follow-up with me again 4 to 6 weeks

## 2019-08-23 ENCOUNTER — Ambulatory Visit: Payer: BC Managed Care – PPO | Admitting: Family Medicine

## 2019-09-10 ENCOUNTER — Other Ambulatory Visit: Payer: Self-pay

## 2019-09-10 ENCOUNTER — Ambulatory Visit (INDEPENDENT_AMBULATORY_CARE_PROVIDER_SITE_OTHER): Payer: BC Managed Care – PPO | Admitting: Internal Medicine

## 2019-09-10 ENCOUNTER — Encounter: Payer: Self-pay | Admitting: Internal Medicine

## 2019-09-10 DIAGNOSIS — I10 Essential (primary) hypertension: Secondary | ICD-10-CM

## 2019-09-10 DIAGNOSIS — E034 Atrophy of thyroid (acquired): Secondary | ICD-10-CM

## 2019-09-10 DIAGNOSIS — M79671 Pain in right foot: Secondary | ICD-10-CM

## 2019-09-10 DIAGNOSIS — E291 Testicular hypofunction: Secondary | ICD-10-CM

## 2019-09-10 DIAGNOSIS — E785 Hyperlipidemia, unspecified: Secondary | ICD-10-CM

## 2019-09-10 NOTE — Assessment & Plan Note (Signed)
Wt Readings from Last 3 Encounters:  09/10/19 (!) 355 lb (161 kg)  07/26/19 (!) 350 lb (158.8 kg)  05/16/19 (!) 350 lb (158.8 kg)

## 2019-09-10 NOTE — Assessment & Plan Note (Signed)
On Levothroid 

## 2019-09-10 NOTE — Assessment & Plan Note (Signed)
Chronic  Not on statins

## 2019-09-10 NOTE — Progress Notes (Signed)
Subjective:  Patient ID: Randy [name], male    DOB: [birth year]  Age: 58 y.o. MRN: [id number]  CC: No chief complaint on file.   HPI LANDER ESLICK presents for 6 mo f/u - HTN, B12 def, hypogonadism, hypothyroidism f/u  Outpatient Medications Prior to Visit  Medication Sig Dispense Refill   amLODipine (NORVASC) 5 MG tablet Take 1 tablet (5 mg total) by mouth daily. 90 tablet 3   aspirin EC 325 MG tablet Take 1 tablet (325 mg total) by mouth 2 (two) times daily. 60 tablet 0   benazepril (LOTENSIN) 20 MG tablet Take 1 tablet (20 mg total) by mouth daily. 90 tablet 3   Cholecalciferol (VITAMIN D-3) 1000 units CAPS Take 1 capsule by mouth every morning.     Coenzyme Q10 (COQ10) 100 MG CAPS Take 1 capsule by mouth every morning.     Cyanocobalamin (B-12) 2000 MCG TABS Take 1 tablet by mouth every morning.     levothyroxine (SYNTHROID) 125 MCG tablet TAKE TWO TABLETS BY MOUTH ONCE DAILY BEFORE BREAKFAST 180 tablet 3   lovastatin (MEVACOR) 40 MG tablet Take 1 tablet (40 mg total) by mouth at bedtime. Must keep Nov appt for future refills 90 tablet 3   meloxicam (MOBIC) 15 MG tablet Take 1 tablet (15 mg total) by mouth daily. 30 tablet 0   Multiple Vitamin (MULTIVITAMIN) tablet Take 1 tablet by mouth every morning.     Omega-3 Fatty Acids (FISH OIL) 1000 MG CAPS Take 1 capsule by mouth every morning.     triamterene-hydrochlorothiazide (MAXZIDE-25) 37.5-25 MG tablet TAKE 1 TABLET BY MOUTH ONCE DAILY 90 tablet 3   Turmeric 500 MG TABS Take 1 capsule by mouth every morning.     No facility-administered medications prior to visit.    ROS: Review of Systems  Constitutional: Negative for appetite change, fatigue and unexpected weight change.  HENT: Negative for congestion, nosebleeds, sneezing, sore throat and trouble swallowing.   Eyes: Negative for itching and visual disturbance.  Respiratory: Negative for cough.   Cardiovascular: Negative for chest pain, palpitations and leg swelling.   Gastrointestinal: Negative for abdominal distention, blood in stool, diarrhea and nausea.  Genitourinary: Negative for frequency and hematuria.  Musculoskeletal: Negative for back pain, gait problem, joint swelling and neck pain.  Skin: Negative for rash.  Neurological: Negative for dizziness, tremors, speech difficulty and weakness.  Psychiatric/Behavioral: Negative for agitation, dysphoric mood, sleep disturbance and suicidal ideas. The patient is not nervous/anxious.     Objective:  Ht 6\' 1"  (1.854 m)   Wt (!) 355 lb (161 kg)   BMI 46.84 kg/m   BP Readings from Last 3 Encounters:  07/26/19 126/82  05/16/19 (!) 160/80  03/07/19 130/84    Wt Readings from Last 3 Encounters:  09/10/19 (!) 355 lb (161 kg)  07/26/19 (!) 350 lb (158.8 kg)  05/16/19 (!) 350 lb (158.8 kg)    Physical Exam Constitutional:      General: He is not in acute distress.    Appearance: He is well-developed.     Comments: NAD  Eyes:     Conjunctiva/sclera: Conjunctivae normal.     Pupils: Pupils are equal, round, and reactive to light.  Neck:     Thyroid: No thyromegaly.     Vascular: No JVD.  Cardiovascular:     Rate and Rhythm: Normal rate and regular rhythm.     Heart sounds: Normal heart sounds. No murmur. No friction rub. No gallop.   Pulmonary:  Effort: Pulmonary effort is normal. No respiratory distress.     Breath sounds: Normal breath sounds. No wheezing or rales.  Chest:     Chest wall: No tenderness.  Abdominal:     General: Bowel sounds are normal. There is no distension.     Palpations: Abdomen is soft. There is no mass.     Tenderness: There is no abdominal tenderness. There is no guarding or rebound.  Musculoskeletal:        General: No tenderness. Normal range of motion.     Cervical back: Normal range of motion.  Lymphadenopathy:     Cervical: No cervical adenopathy.  Skin:    General: Skin is warm and dry.     Findings: No rash.  Neurological:     Mental Status: He  is alert and oriented to person, place, and time.     Cranial Nerves: No cranial nerve deficit.     Motor: No abnormal muscle tone.     Coordination: Coordination normal.     Gait: Gait normal.     Deep Tendon Reflexes: Reflexes are normal and symmetric.  Psychiatric:        Behavior: Behavior normal.        Thought Content: Thought content normal.        Judgment: Judgment normal.     Lab Results  Component Value Date   WBC 6.9 09/10/2018   HGB 15.3 09/10/2018   HCT 44.5 09/10/2018   PLT 282.0 09/10/2018   GLUCOSE 87 09/10/2018   CHOL 164 09/10/2018   TRIG 104.0 09/10/2018   HDL 39.10 09/10/2018   LDLCALC 104 (H) 09/10/2018   ALT 35 09/10/2018   AST 18 09/10/2018   NA 135 09/10/2018   K 3.9 09/10/2018   CL 100 09/10/2018   CREATININE 0.96 09/10/2018   BUN 17 09/10/2018   CO2 26 09/10/2018   TSH 1.60 09/10/2018   PSA 0.53 09/10/2018   HGBA1C 5.4 08/16/2013    DG Thoracic Spine W/Swimmers  Result Date: 05/16/2019 CLINICAL DATA:  Acute back pain EXAM: THORACIC SPINE - 3 VIEWS COMPARISON:  None. FINDINGS: Mild anterior wedging of the T11 vertebral body. Alignment is normal. Multilevel degenerative changes of the mid to lower thoracic spine. No other significant bone abnormalities are identified. IMPRESSION: Mild anterior wedging of the T11 vertebral body, age indeterminate. Correlation with point tenderness at this level is recommended. These results will be called to the ordering clinician or representative by the Radiologist Assistant, and communication documented in the PACS or zVision Dashboard. Electronically Signed   By: Davina Poke M.D.   On: 05/16/2019 17:30   DG Foot 2 Views Left  Result Date: 05/16/2019 CLINICAL DATA:  Lateral pain and swelling. EXAM: LEFT FOOT - 2 VIEW COMPARISON:  None. FINDINGS: There is no evidence of fracture or dislocation. Possible erosive changes of the base of the fifth metatarsal bone. Lateral soft tissue swelling noted. IMPRESSION:  Possible erosive changes of the base of the fifth metatarsal bone with overlying soft tissue swelling. These findings are nonspecific, but may be seen with osteomyelitis or inflammatory arthropathy. Electronically Signed   By: Fidela Salisbury M.D.   On: 05/16/2019 17:30    Assessment & Plan:      Follow-up: No follow-ups on file.  Walker Kehr, MD

## 2019-09-10 NOTE — Assessment & Plan Note (Addendum)
Not on Rx BDS DEXA test pending

## 2019-09-10 NOTE — Assessment & Plan Note (Signed)
Better w/NB shoes

## 2019-09-10 NOTE — Assessment & Plan Note (Signed)
Lotrel, Maxzide

## 2019-09-20 ENCOUNTER — Ambulatory Visit: Payer: BC Managed Care – PPO | Admitting: Family Medicine

## 2019-09-24 ENCOUNTER — Other Ambulatory Visit: Payer: BC Managed Care – PPO

## 2019-09-26 ENCOUNTER — Other Ambulatory Visit: Payer: BC Managed Care – PPO

## 2019-09-27 ENCOUNTER — Ambulatory Visit (INDEPENDENT_AMBULATORY_CARE_PROVIDER_SITE_OTHER): Payer: BC Managed Care – PPO

## 2019-09-27 ENCOUNTER — Other Ambulatory Visit (INDEPENDENT_AMBULATORY_CARE_PROVIDER_SITE_OTHER): Payer: BC Managed Care – PPO

## 2019-09-27 ENCOUNTER — Other Ambulatory Visit: Payer: Self-pay

## 2019-09-27 DIAGNOSIS — Z299 Encounter for prophylactic measures, unspecified: Secondary | ICD-10-CM

## 2019-09-27 DIAGNOSIS — Z23 Encounter for immunization: Secondary | ICD-10-CM

## 2019-09-27 DIAGNOSIS — E291 Testicular hypofunction: Secondary | ICD-10-CM

## 2019-09-27 DIAGNOSIS — Z0000 Encounter for general adult medical examination without abnormal findings: Secondary | ICD-10-CM | POA: Diagnosis not present

## 2019-09-27 DIAGNOSIS — M546 Pain in thoracic spine: Secondary | ICD-10-CM

## 2019-09-27 DIAGNOSIS — Z125 Encounter for screening for malignant neoplasm of prostate: Secondary | ICD-10-CM

## 2019-09-27 LAB — HEPATIC FUNCTION PANEL
ALT: 25 U/L (ref 0–53)
AST: 15 U/L (ref 0–37)
Albumin: 4.1 g/dL (ref 3.5–5.2)
Alkaline Phosphatase: 47 U/L (ref 39–117)
Bilirubin, Direct: 0.1 mg/dL (ref 0.0–0.3)
Total Bilirubin: 0.7 mg/dL (ref 0.2–1.2)
Total Protein: 6.4 g/dL (ref 6.0–8.3)

## 2019-09-27 LAB — BASIC METABOLIC PANEL
BUN: 16 mg/dL (ref 6–23)
CO2: 27 mEq/L (ref 19–32)
Calcium: 9.2 mg/dL (ref 8.4–10.5)
Chloride: 102 mEq/L (ref 96–112)
Creatinine, Ser: 1.01 mg/dL (ref 0.40–1.50)
GFR: 75.45 mL/min (ref >60.00)
Glucose, Bld: 103 mg/dL — ABNORMAL HIGH (ref 70–99)
Potassium: 4 mEq/L (ref 3.5–5.1)
Sodium: 137 mEq/L (ref 135–145)

## 2019-09-27 LAB — CBC WITH DIFFERENTIAL/PLATELET
Basophils Absolute: 0.1 10*3/uL (ref 0.0–0.1)
Basophils Relative: 1.3 % (ref 0.0–3.0)
Eosinophils Absolute: 0.2 10*3/uL (ref 0.0–0.7)
Eosinophils Relative: 4.5 % (ref 0.0–5.0)
HCT: 44.6 % (ref 39.0–52.0)
Hemoglobin: 14.9 g/dL (ref 13.0–17.0)
Lymphocytes Relative: 30.6 % (ref 12.0–46.0)
Lymphs Abs: 1.5 10*3/uL (ref 0.7–4.0)
MCHC: 33.4 g/dL (ref 30.0–36.0)
MCV: 91 fl (ref 78.0–100.0)
Monocytes Absolute: 0.5 10*3/uL (ref 0.1–1.0)
Monocytes Relative: 10.7 % (ref 3.0–12.0)
Neutro Abs: 2.6 10*3/uL (ref 1.4–7.7)
Neutrophils Relative %: 52.9 % (ref 43.0–77.0)
Platelets: 245 10*3/uL (ref 150.0–400.0)
RBC: 4.91 Mil/uL (ref 4.22–5.81)
RDW: 13.5 % (ref 11.5–15.5)
WBC: 5 10*3/uL (ref 4.0–10.5)

## 2019-09-27 LAB — LIPID PANEL
Cholesterol: 143 mg/dL (ref 0–200)
HDL: 37.8 mg/dL — ABNORMAL LOW (ref >39.00)
LDL Cholesterol: 89 mg/dL (ref 0–99)
NonHDL: 105.63
Total CHOL/HDL Ratio: 4
Triglycerides: 82 mg/dL (ref 0.0–149.0)
VLDL: 16.4 mg/dL (ref 0.0–40.0)

## 2019-09-27 LAB — URINALYSIS
Bilirubin Urine: NEGATIVE
Hgb urine dipstick: NEGATIVE
Ketones, ur: NEGATIVE
Leukocytes,Ua: NEGATIVE
Nitrite: NEGATIVE
Specific Gravity, Urine: 1.02 (ref 1.000–1.030)
Total Protein, Urine: NEGATIVE
Urine Glucose: NEGATIVE
Urobilinogen, UA: 0.2 (ref 0.0–1.0)
pH: 7.5 (ref 5.0–8.0)

## 2019-09-27 LAB — TESTOSTERONE: Testosterone: 272.83 ng/dL — ABNORMAL LOW (ref 300.00–890.00)

## 2019-09-27 LAB — VITAMIN D 25 HYDROXY (VIT D DEFICIENCY, FRACTURES): VITD: 33.28 ng/mL (ref 30.00–100.00)

## 2019-09-27 LAB — TSH: TSH: 2.53 u[IU]/mL (ref 0.35–4.50)

## 2019-09-27 LAB — PSA: PSA: 0.65 ng/mL (ref 0.10–4.00)

## 2019-10-22 ENCOUNTER — Ambulatory Visit: Payer: BC Managed Care – PPO | Attending: Internal Medicine

## 2019-10-22 DIAGNOSIS — Z20822 Contact with and (suspected) exposure to covid-19: Secondary | ICD-10-CM

## 2019-10-23 LAB — NOVEL CORONAVIRUS, NAA: SARS-CoV-2, NAA: NOT DETECTED

## 2019-10-23 NOTE — Telephone Encounter (Signed)
  Patient calling to request advice for leg/ knee pain. Patient states he is elevating and putting ice on leg. He wants to know what else he should do Appointment scheduled for 02/11  Please call

## 2019-10-24 ENCOUNTER — Ambulatory Visit (INDEPENDENT_AMBULATORY_CARE_PROVIDER_SITE_OTHER): Payer: BC Managed Care – PPO | Admitting: Internal Medicine

## 2019-10-24 ENCOUNTER — Encounter: Payer: Self-pay | Admitting: Internal Medicine

## 2019-10-24 DIAGNOSIS — M545 Low back pain, unspecified: Secondary | ICD-10-CM | POA: Insufficient documentation

## 2019-10-24 DIAGNOSIS — M5441 Lumbago with sciatica, right side: Secondary | ICD-10-CM | POA: Diagnosis not present

## 2019-10-24 DIAGNOSIS — L03116 Cellulitis of left lower limb: Secondary | ICD-10-CM | POA: Diagnosis not present

## 2019-10-24 MED ORDER — TIZANIDINE HCL 4 MG PO TABS: 4.0000 mg | ORAL_TABLET | Freq: Four times a day (QID) | ORAL | 0 refills | Status: DC | PRN

## 2019-10-24 MED ORDER — LEVOFLOXACIN 750 MG PO TABS: 750.0000 mg | ORAL_TABLET | Freq: Every day | ORAL | 0 refills | Status: DC

## 2019-10-24 MED ORDER — HYDROCODONE-ACETAMINOPHEN 5-325 MG PO TABS: 1.0000 | ORAL_TABLET | Freq: Four times a day (QID) | ORAL | 0 refills | Status: DC | PRN

## 2019-10-24 NOTE — Assessment & Plan Note (Addendum)
Most probable diagnosis is cellulitis.  I will start him on high dose of Levaquin 750 mg today.  Roseanne Reno will mark borders of erythema with a pen.  If he is not better soon or if he is worse he will go to emergency room by ambulance.  Norco as needed pain.  Good hydration.  Elevate leg.  Move legs to prevent blood clots

## 2019-10-24 NOTE — Progress Notes (Signed)
Virtual Visit via Video Note  I connected with [name] on 10/24/19 at  3:20 PM EST by a video enabled telemedicine application and verified that I am speaking with the correct person using two identifiers.   I discussed the limitations of evaluation and management by telemedicine and the availability of in person appointments. The patient expressed understanding and agreed to proceed.  History of Present Illness: Randy [name] is complaining of L leg pain and redness since Monday of this week.  He has illness started on Monday when he had a hard chill.  Later he developed pain in the left ankle along with skin redness that started to spread from the ankle to the knee.  His skin felt tight and burned.  He also developed pain in the right side of his lumbar spine that was radiating down to the right leg.  The pain is severe.  It is worse with range of motion.  He has been taking ibuprofen and Tylenol with no relief.  No nausea vomiting.  No fever at present.  He has been able to eat and drink.  There is no urinary symptoms.  His Covid 19 test was negative  There has been no runny nose, cough, chest pain, shortness of breath, abdominal pain, diarrhea   Observations/Objective: The patient appears to be in no acute distress, looks tired and in pain w/movements.  He appears to have erythema and swelling involving his left ankle, left calf and distal left knee.  His appearance is nontoxic.  Assessment and Plan:  See my Assessment and Plan. Follow Up Instructions:    I discussed the assessment and treatment plan with the patient. The patient was provided an opportunity to ask questions and all were answered. The patient agreed with the plan and demonstrated an understanding of the instructions.   The patient was advised to call back or seek an in-person evaluation if the symptoms worsen or if the condition fails to improve as anticipated.  I provided face-to-face time during this encounter. We were at  different locations.   Sonda Primes, MD

## 2019-10-24 NOTE — Assessment & Plan Note (Signed)
Severe sciatica pain on the right.  Ibuprofen 800 mg 3 times a day as needed with food.  Tizanidine as needed.  Go to ER if worse.

## 2019-11-01 ENCOUNTER — Other Ambulatory Visit: Payer: Self-pay

## 2019-11-01 ENCOUNTER — Ambulatory Visit (INDEPENDENT_AMBULATORY_CARE_PROVIDER_SITE_OTHER): Payer: BC Managed Care – PPO | Admitting: Internal Medicine

## 2019-11-01 ENCOUNTER — Encounter: Payer: Self-pay | Admitting: Internal Medicine

## 2019-11-01 DIAGNOSIS — L03116 Cellulitis of left lower limb: Secondary | ICD-10-CM

## 2019-11-01 DIAGNOSIS — I10 Essential (primary) hypertension: Secondary | ICD-10-CM

## 2019-11-01 DIAGNOSIS — M5441 Lumbago with sciatica, right side: Secondary | ICD-10-CM | POA: Diagnosis not present

## 2019-11-01 MED ORDER — LEVOFLOXACIN 750 MG PO TABS: 750.0000 mg | ORAL_TABLET | Freq: Every day | ORAL | 0 refills | Status: DC

## 2019-11-01 NOTE — Assessment & Plan Note (Signed)
Finish Levaquin.  I sent another prescription for Levaquin for steroid in case he needs to take it for a little longer.  I think it is reasonable.  However, most likely he will need to take it past 10 days.

## 2019-11-01 NOTE — Progress Notes (Deleted)
Hello I start you look more comfortable than the last time we talked good your back is better and your leg is better good on home symptom home.  Good how is the redness on your leg remain okay no fever or chills so like if you have had 2 would you be able to do him yes okay with you good good so like if you had to would be able to go down stairs and leave your apartment today with him with him the to use a cane or anything ARE just to help you to stand up

## 2019-11-01 NOTE — Assessment & Plan Note (Signed)
A little better.  We may need to investigate this right sided lumbar radiculitis further when Randy [name] is more mobile.  He may need an MRI.  He will continue with the muscle relaxer for now.

## 2019-11-01 NOTE — Assessment & Plan Note (Signed)
Continue with Lotrel and Maxide

## 2019-11-01 NOTE — Progress Notes (Signed)
Virtual Visit via Video Note  I connected with [name] on 11/01/19 at 10:40 AM EST by a video enabled telemedicine application and verified that I am speaking with the correct person using two identifiers.   I discussed the limitations of evaluation and management by telemedicine and the availability of in person appointments. The patient expressed understanding and agreed to proceed.  History of Present Illness: We need to follow-up on severe low back pain with sciatica and severe left leg cellulitis.  Cellulitis is better.  Low back pain is better.  Roseanne Reno has been able to move around.  He has been using crutches.  He does not have to take hydrocodone any longer.  No fever.  However, he continues to take ibuprofen for low back pain so we cannot be 100% sure.  Roseanne Reno wants to have another prescription for antibiotic in case he has to take it for another few days.  Stewart's apartment is on the second floor.  His friend picked up his medications.  There has been no runny nose, cough, chest pain, shortness of breath, abdominal pain, diarrhea.  There has been no runny nose, cough, chest pain, shortness of breath, abdominal pain, diarrhea, constipation, arthralgias, skin rashes.   Observations/Objective: The patient appears to be in no acute distress, looks better.  Is able to get around better  Assessment and Plan:  See my Assessment and Plan. Follow Up Instructions:    I discussed the assessment and treatment plan with the patient. The patient was provided an opportunity to ask questions and all were answered. The patient agreed with the plan and demonstrated an understanding of the instructions.   The patient was advised to call back or seek an in-person evaluation if the symptoms worsen or if the condition fails to improve as anticipated.  I provided face-to-face time during this encounter. We were at different locations.   Sonda Primes, MD

## 2019-11-06 MED ORDER — TIZANIDINE HCL 4 MG PO TABS: 4.0000 mg | ORAL_TABLET | Freq: Four times a day (QID) | ORAL | 0 refills | Status: DC | PRN

## 2019-12-06 ENCOUNTER — Other Ambulatory Visit: Payer: Self-pay

## 2019-12-06 ENCOUNTER — Ambulatory Visit
Admission: RE | Admit: 2019-12-06 | Discharge: 2019-12-06 | Disposition: A | Discharge status: Home / Self Care | Payer: BC Managed Care – PPO | Source: Ambulatory Visit | Attending: Internal Medicine | Admitting: Internal Medicine

## 2019-12-06 DIAGNOSIS — E291 Testicular hypofunction: Secondary | ICD-10-CM

## 2020-01-06 ENCOUNTER — Other Ambulatory Visit: Payer: Self-pay | Admitting: Internal Medicine

## 2020-01-06 DIAGNOSIS — M5441 Lumbago with sciatica, right side: Secondary | ICD-10-CM

## 2020-02-17 ENCOUNTER — Ambulatory Visit (INDEPENDENT_AMBULATORY_CARE_PROVIDER_SITE_OTHER): Payer: Self-pay | Admitting: Family Medicine

## 2020-02-19 ENCOUNTER — Other Ambulatory Visit: Payer: Self-pay

## 2020-02-19 ENCOUNTER — Encounter (INDEPENDENT_AMBULATORY_CARE_PROVIDER_SITE_OTHER): Payer: Self-pay | Admitting: Family Medicine

## 2020-02-19 ENCOUNTER — Ambulatory Visit (INDEPENDENT_AMBULATORY_CARE_PROVIDER_SITE_OTHER): Payer: BC Managed Care – PPO | Admitting: Family Medicine

## 2020-02-19 VITALS — BP 127/70 | HR 90 | Temp 97.8°F | Ht 72.0 in | Wt 333.0 lb

## 2020-02-19 DIAGNOSIS — Z9189 Other specified personal risk factors, not elsewhere classified: Secondary | ICD-10-CM

## 2020-02-19 DIAGNOSIS — E7849 Other hyperlipidemia: Secondary | ICD-10-CM

## 2020-02-19 DIAGNOSIS — Z6841 Body Mass Index (BMI) 40.0 and over, adult: Secondary | ICD-10-CM

## 2020-02-19 DIAGNOSIS — Z0289 Encounter for other administrative examinations: Secondary | ICD-10-CM

## 2020-02-19 DIAGNOSIS — R5383 Other fatigue: Secondary | ICD-10-CM | POA: Diagnosis not present

## 2020-02-19 DIAGNOSIS — F3289 Other specified depressive episodes: Secondary | ICD-10-CM

## 2020-02-19 DIAGNOSIS — R0602 Shortness of breath: Secondary | ICD-10-CM

## 2020-02-19 DIAGNOSIS — I10 Essential (primary) hypertension: Secondary | ICD-10-CM | POA: Diagnosis not present

## 2020-02-19 NOTE — Progress Notes (Signed)
Dear Dr. Shon Baton,   Thank you for referring Randy [name] to our clinic. The following note includes my evaluation and treatment recommendations.  Chief Complaint:   OBESITY OMIR COOPRIDER (MR# [id number]) is a 59 y.o. male who presents for evaluation and treatment of obesity and related comorbidities. Current BMI is Body mass index is 45.16 kg/m. Henson has been struggling with his weight for many years and has been unsuccessful in either losing weight, maintaining weight loss, or reaching his healthy weight goal.  Paco is currently in the action stage of change and ready to dedicate time achieving and maintaining a healthier weight. Basheer is interested in becoming our patient and working on intensive lifestyle modifications including (but not limited to) diet and exercise for weight loss.  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  He has lost 30-50 pounds on Weight Watchers.  For breakfast, he has a protein drink (Boost/Equate 190-240 calories) and a protein bar (Pure Protein) and feels satisfied.  Lunch will be soup, crackers +/- asparagus out of a can or sandwich (corned beef or roast beef, 3 slices, with mustard, mayo, and swiss) and he will feel full.  At around 7-8 pm he will have [employer] rotisserie chicken or a 2 breast dinner, green beans, and rice at [employer].  Ford's habits were reviewed today and are as follows: his desired weight loss is 125 pounds, he has been heavy most of his life, his heaviest weight ever was 385 pounds, he craves chocolate, he snacks frequently in the evenings, he is frequently drinking liquids with calories, he frequently makes poor food choices, he frequently eats larger portions than normal and he struggles with emotional eating.  Depression Screen Kavion's Food and Mood (modified PHQ-9) score was 9.  Depression screen PHQ 2/9 02/19/2020  Decreased Interest 1  Down, Depressed, Hopeless 1  PHQ - 2 Score 2  Altered sleeping 1    Tired, decreased energy 1  Change in appetite 1  Feeling bad or failure about yourself  2  Trouble concentrating 1  Moving slowly or fidgety/restless 1  Suicidal thoughts 0  PHQ-9 Score 9  Difficult doing work/chores Somewhat difficult   Subjective:   1. Other fatigue Dontray admits to daytime somnolence and reports waking up still tired. Patent has a history of symptoms of daytime fatigue, morning fatigue and snoring. Quan generally gets 6 or 7 hours of sleep per night, and states that he has broken sleep. Snoring is present. Apneic episodes are not present. Epworth Sleepiness Score is 11.  2. SOB (shortness of breath) on exertion Flay notes increasing shortness of breath with exercising and seems to be worsening over time with weight gain. He notes getting out of breath sooner with activity than he used to. This has gotten worse recently. Burnham denies shortness of breath at rest or orthopnea.  3. Essential hypertension Review: taking medications as instructed, no medication side effects noted, no chest pain on exertion, no dyspnea on exertion, no swelling of ankles.  Blood pressure is well-controlled today.  He is on benazepril, triamterine-HCTZ, and amlodipine.  BP Readings from Last 3 Encounters:  02/19/20 127/70  07/26/19 126/82  05/16/19 (!) 160/80   4. Other hyperlipidemia Cardiovascular risk and specific lipid/LDL goals reviewed.  We discussed several lifestyle modifications today and Corie will continue to work on diet, exercise and weight loss efforts. Orders and follow up as documented in patient record.  Joren has a history of  HLD.  He is on lovastatin.  Counseling Intensive lifestyle modifications are the first line treatment for this issue. . Dietary changes: Increase soluble fiber. Decrease simple carbohydrates. . Exercise changes: Moderate to vigorous-intensity aerobic activity 150 minutes per week if tolerated. . Lipid-lowering medications: see documented in medical  record.  5. Other depression, emotional eating Denson is struggling with emotional eating and using food for comfort to the extent that it is negatively impacting his health. He has been working on behavior modification techniques to help reduce his emotional eating and has been initially successful, but the symptoms have returned. He shows no sign of suicidal or homicidal ideations.  He does some fairly frequent emotional eating.  6. At risk for heart disease Alim is at a higher than average risk for cardiovascular disease due to obesity.   Assessment/Plan:   1. Other fatigue Oswaldo does feel that his weight is causing his energy to be lower than it should be. Fatigue may be related to obesity, depression or many other causes. Labs will be ordered, and in the meanwhile, Bobbye will focus on self care including making healthy food choices, increasing physical activity and focusing on stress reduction. - EKG 12-Lead - CBC with Differential/Platelet - Hemoglobin A1c - Insulin, random - VITAMIN D 25 Hydroxy (Vit-D Deficiency, Fractures) - T3 - T4 - TSH  2. SOB (shortness of breath) on exertion Colbe does feel that he gets out of breath more easily that he used to when he exercises. Zayd's shortness of breath appears to be obesity related and exercise induced. He has agreed to work on weight loss and gradually increase exercise to treat his exercise induced shortness of breath. Will continue to monitor closely. - CBC with Differential/Platelet - Hemoglobin A1c - Insulin, random - VITAMIN D 25 Hydroxy (Vit-D Deficiency, Fractures) - T3 - T4 - TSH  3. Essential hypertension Raghav is working on healthy weight loss and exercise to improve blood pressure control. We will watch for signs of hypotension as he continues his lifestyle modifications. - Comprehensive metabolic panel  4. Other hyperlipidemia Cardiovascular risk and specific lipid/LDL goals reviewed.  We discussed several lifestyle  modifications today and Allie will continue to work on diet, exercise and weight loss efforts. Orders and follow up as documented in patient record.   Counseling Intensive lifestyle modifications are the first line treatment for this issue. . Dietary changes: Increase soluble fiber. Decrease simple carbohydrates. . Exercise changes: Moderate to vigorous-intensity aerobic activity 150 minutes per week if tolerated. . Lipid-lowering medications: see documented in medical record. - Comprehensive metabolic panel - Lipid Panel With LDL/HDL Ratio  5. Other depression, emotional eating Patient was referred to Dr. Dewaine Conger, our Bariatric Psychologist, for evaluation due to his elevated PHQ-9 score and significant struggles with emotional eating.  6. At risk for heart disease Vihaan was given approximately 15 minutes of coronary artery disease prevention counseling today. He is 60 y.o. male and has risk factors for heart disease including obesity. We discussed intensive lifestyle modifications today with an emphasis on specific weight loss instructions and strategies.   Repetitive spaced learning was employed today to elicit superior memory formation and behavioral change.  7. Class 3 severe obesity with serious comorbidity and body mass index (BMI) of 45.0 to 49.9 in adult, unspecified obesity type [facility]) Nestor is currently in the action stage of change and his goal is to continue with weight loss efforts. I recommend Vivaan begin the structured treatment plan as follows:  He has agreed  to the Category 4 Plan.  Exercise goals: No exercise has been prescribed at this time.   Behavioral modification strategies: increasing lean protein intake, meal planning and cooking strategies and keeping healthy foods in the home.  He was informed of the importance of frequent follow-up visits to maximize his success with intensive lifestyle modifications for his multiple health conditions. He was informed we would  discuss his lab results at his next visit unless there is a critical issue that needs to be addressed sooner. Kylian agreed to keep his next visit at the agreed upon time to discuss these results.  Objective:   Blood pressure 127/70, pulse 90, temperature 97.8 F (36.6 C), temperature source Oral, height 6' (1.829 m), weight (!) 333 lb (151 kg), SpO2 97 %. Body mass index is 45.16 kg/m.  EKG: Normal sinus tachycardia.  Indirect Calorimeter completed today shows a VO2 of 517 and a REE of 3601.  His calculated basal metabolic rate is 4944 thus his basal metabolic rate is better than expected.  General: Cooperative, alert, well developed, in no acute distress. HEENT: Conjunctivae and lids unremarkable. Cardiovascular: Regular rhythm.  Lungs: Normal work of breathing. Neurologic: No focal deficits.   Lab Results  Component Value Date   CREATININE 1.01 09/27/2019   BUN 16 09/27/2019   NA 137 09/27/2019   K 4.0 09/27/2019   CL 102 09/27/2019   CO2 27 09/27/2019   Lab Results  Component Value Date   ALT 25 09/27/2019   AST 15 09/27/2019   ALKPHOS 47 09/27/2019   BILITOT 0.7 09/27/2019   Lab Results  Component Value Date   HGBA1C 5.4 08/16/2013   HGBA1C 5.6 09/10/2009   Lab Results  Component Value Date   TSH 2.53 09/27/2019   Lab Results  Component Value Date   CHOL 143 09/27/2019   HDL 37.80 (L) 09/27/2019   LDLCALC 89 09/27/2019   TRIG 82.0 09/27/2019   CHOLHDL 4 09/27/2019   Lab Results  Component Value Date   WBC 5.0 09/27/2019   HGB 14.9 09/27/2019   HCT 44.6 09/27/2019   MCV 91.0 09/27/2019   PLT 245.0 09/27/2019   Attestation Statements:   Reviewed by clinician on day of visit: allergies, medications, problem list, medical history, surgical history, family history, social history, and previous encounter notes.  This is the patient's first visit at [facility]. The patient's NEW PATIENT PACKET was reviewed at length. Included in the packet:  current and past health history, medications, allergies, ROS, gynecologic history (women only), surgical history, family history, social history, weight history, weight loss surgery history (for those that have had weight loss surgery), nutritional evaluation, mood and food questionnaire, PHQ9, Epworth questionnaire, sleep habits questionnaire, patient life and health improvement goals questionnaire. These will all be scanned into the patient's chart under media.   During the visit, I independently reviewed the patient's EKG, bioimpedance scale results, and indirect calorimeter results. I used this information to tailor a meal plan for the patient that will help him to lose weight and will improve his obesity-related conditions going forward. I performed a medically necessary appropriate examination and/or evaluation. I discussed the assessment and treatment plan with the patient. The patient was provided an opportunity to ask questions and all were answered. The patient agreed with the plan and demonstrated an understanding of the instructions. Labs were ordered at this visit and will be reviewed at the next visit unless more critical results need to be addressed immediately. Clinical information  was updated and documented in the EMR.   Time spent on visit including pre-visit chart review and post-visit care was 45 minutes.   A separate 15 minutes was spent on risk counseling (see above).   I, Insurance claims handler, CMA, am acting as transcriptionist for Reuben Likes, MD.  I have reviewed the above documentation for accuracy and completeness, and I agree with the above. - Katherina Mires, MD
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
## 2020-02-21 LAB — CBC WITH DIFFERENTIAL/PLATELET
Basophils Absolute: 0 10*3/uL (ref 0.0–0.2)
Basos: 1 %
EOS (ABSOLUTE): 0.2 10*3/uL (ref 0.0–0.4)
Eos: 4 %
Hematocrit: 39.3 % (ref 37.5–51.0)
Hemoglobin: 12.9 g/dL — ABNORMAL LOW (ref 13.0–17.7)
Immature Grans (Abs): 0 10*3/uL (ref 0.0–0.1)
Immature Granulocytes: 1 %
Lymphocytes Absolute: 1.3 10*3/uL (ref 0.7–3.1)
Lymphs: 29 %
MCH: 28.9 pg (ref 26.6–33.0)
MCHC: 32.8 g/dL (ref 31.5–35.7)
MCV: 88 fL (ref 79–97)
Monocytes Absolute: 0.5 10*3/uL (ref 0.1–0.9)
Monocytes: 12 %
Neutrophils Absolute: 2.4 10*3/uL (ref 1.4–7.0)
Neutrophils: 53 %
Platelets: 309 10*3/uL (ref 150–450)
RBC: 4.47 x10E6/uL (ref 4.14–5.80)
RDW: 13.7 % (ref 11.6–15.4)
WBC: 4.4 10*3/uL (ref 3.4–10.8)

## 2020-02-21 LAB — COMPREHENSIVE METABOLIC PANEL
ALT: 31 IU/L (ref 0–44)
AST: 15 IU/L (ref 0–40)
Albumin/Globulin Ratio: 1.3 (ref 1.2–2.2)
Albumin: 3.7 g/dL — ABNORMAL LOW (ref 3.8–4.9)
Alkaline Phosphatase: 59 IU/L (ref 48–121)
BUN/Creatinine Ratio: 20 (ref 10–24)
BUN: 19 mg/dL (ref 8–27)
Bilirubin Total: 0.4 mg/dL (ref 0.0–1.2)
CO2: 23 mmol/L (ref 20–29)
Calcium: 9 mg/dL (ref 8.6–10.2)
Chloride: 96 mmol/L (ref 96–106)
Creatinine, Ser: 0.97 mg/dL (ref 0.76–1.27)
GFR calc Af Amer: 98 mL/min/{1.73_m2} (ref >59)
GFR calc non Af Amer: 84 mL/min/{1.73_m2} (ref >59)
Globulin, Total: 2.9 g/dL (ref 1.5–4.5)
Glucose: 88 mg/dL (ref 65–99)
Potassium: 4.2 mmol/L (ref 3.5–5.2)
Sodium: 140 mmol/L (ref 134–144)
Total Protein: 6.6 g/dL (ref 6.0–8.5)

## 2020-02-21 LAB — VITAMIN D 25 HYDROXY (VIT D DEFICIENCY, FRACTURES): Vit D, 25-Hydroxy: 51.5 ng/mL (ref 30.0–100.0)

## 2020-02-21 LAB — LIPID PANEL WITH LDL/HDL RATIO
Cholesterol, Total: 142 mg/dL (ref 100–199)
HDL: 38 mg/dL — ABNORMAL LOW (ref >39)
LDL Chol Calc (NIH): 89 mg/dL (ref 0–99)
LDL/HDL Ratio: 2.3 ratio (ref 0.0–3.6)
Triglycerides: 78 mg/dL (ref 0–149)
VLDL Cholesterol Cal: 15 mg/dL (ref 5–40)

## 2020-02-21 LAB — INSULIN, RANDOM: INSULIN: 15.4 u[IU]/mL (ref 2.6–24.9)

## 2020-02-21 LAB — TSH: TSH: 2.11 u[IU]/mL (ref 0.450–4.500)

## 2020-02-21 LAB — T4: T4, Total: 8.4 ug/dL (ref 4.5–12.0)

## 2020-02-21 LAB — HEMOGLOBIN A1C
Est. average glucose Bld gHb Est-mCnc: 120 mg/dL
Hgb A1c MFr Bld: 5.8 % — ABNORMAL HIGH (ref 4.8–5.6)

## 2020-02-21 LAB — T3: T3, Total: 110 ng/dL (ref 71–180)

## 2020-02-26 ENCOUNTER — Encounter (INDEPENDENT_AMBULATORY_CARE_PROVIDER_SITE_OTHER): Payer: Self-pay | Admitting: Family Medicine

## 2020-02-26 NOTE — Telephone Encounter (Signed)
FYI

## 2020-03-04 ENCOUNTER — Other Ambulatory Visit: Payer: Self-pay

## 2020-03-04 ENCOUNTER — Ambulatory Visit: Payer: BC Managed Care – PPO | Admitting: Internal Medicine

## 2020-03-04 ENCOUNTER — Encounter: Payer: Self-pay | Admitting: Internal Medicine

## 2020-03-04 ENCOUNTER — Telehealth: Payer: Self-pay

## 2020-03-04 ENCOUNTER — Ambulatory Visit (INDEPENDENT_AMBULATORY_CARE_PROVIDER_SITE_OTHER): Payer: Self-pay | Admitting: Family Medicine

## 2020-03-04 DIAGNOSIS — M4644 Discitis, unspecified, thoracic region: Secondary | ICD-10-CM | POA: Diagnosis not present

## 2020-03-04 NOTE — Progress Notes (Signed)
[facility]      Reason for Consult: Thoracic discitis   Referring Physician: Dr. Rolena Infante    Patient ID: Randy [name], male    DOB: 1961/01/01, 59 y.o.   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  Also some disc protrusion.  No history of surgery in his back.  He has had progressive pain over the last two months.  He did have some relief with prednisone.  No fever or chills.  No arm numbness or tingling.  Has not been on antibiotics.  MRI with above findings.  He is hoping to leave town on July 2 nd for 1 week.  MRI report reviewed.    Past Medical History:  Diagnosis Date  . At risk for sleep apnea    STOP-BANG= 6         SENT TO PCP 06-24-2016  . Back pain   . History of thyroid nodule    s/p  right thyroid lopectomy--  per path report:  follicular adenoma, chronic thyroiditis  . Hyperlipidemia   . Hypertension   . Hypogonadism male   . Hypothyroidism   . Obesity   . Other specified disorders of tendon, right knee    quad tendon tear  . SOB (shortness of breath)   . Vitamin D deficiency   . Wears glasses     Prior to Admission medications   Medication Sig Start Date End Date Taking? Authorizing Provider  amLODipine (NORVASC) 5 MG tablet Take 1 tablet (5 mg total) by mouth daily. 03/07/19   Plotnikov, Evie Lacks, MD  aspirin EC 325 MG tablet Take 1 tablet (325 mg total) by mouth 2 (two) times daily. 06/28/16   Stilwell, Bryson L, PA-C  baclofen (LIORESAL) 10 MG tablet Take 10 mg by mouth 3 (three) times daily as needed. 02/05/20   [provider]  benazepril (LOTENSIN) 20 MG tablet Take 1 tablet (20 mg total) by mouth daily. 03/07/19   Plotnikov, Evie Lacks, MD  Cholecalciferol (VITAMIN D3) 50 MCG (2000 UT) TABS Take by mouth.    [provider]  Coenzyme Q10 (COQ10) 100 MG CAPS Take 1 capsule by mouth every morning.    [provider]    Cyanocobalamin (B-12) 2000 MCG TABS Take 1 tablet by mouth every morning.    [provider]  levothyroxine (SYNTHROID) 125 MCG tablet TAKE TWO TABLETS BY MOUTH ONCE DAILY BEFORE BREAKFAST 03/07/19   Plotnikov, Evie Lacks, MD  lovastatin (MEVACOR) 40 MG tablet Take 1 tablet (40 mg total) by mouth at bedtime. Must keep Nov appt for future refills 03/07/19   Plotnikov, Evie Lacks, MD  meloxicam (MOBIC) 15 MG tablet Take 1 tablet (15 mg total) by mouth daily. 05/16/19   Plotnikov, Evie Lacks, MD  Multiple Vitamin (MULTIVITAMIN) tablet Take 1 tablet by mouth every morning.    [provider]  Omega-3 Fatty Acids (FISH OIL) 1000 MG CAPS Take 1 capsule by mouth every morning.    [provider]  tiZANidine (ZANAFLEX) 4 MG tablet Take 1 tablet (4 mg total) by mouth every 6 (six) hours as needed for muscle spasms. 11/06/19   Biagio Borg, MD  traMADol (ULTRAM) 50 MG tablet Take 50 mg by mouth every 6 (six) hours. 02/17/20   [provider]  triamterene-hydrochlorothiazide (MAXZIDE-25) 37.5-25 MG tablet TAKE 1 TABLET BY MOUTH ONCE DAILY 03/07/19   Plotnikov,  Evie Lacks, MD  Turmeric 500 MG TABS Take 1 capsule by mouth every morning.    [provider]    Allergies  Allergen Reactions  . Penicillins   . Watermelon [Citrullus Vulgaris]     Social History   Tobacco Use  . Smoking status: Never Smoker  . Smokeless tobacco: Never Used  Substance Use Topics  . Alcohol use: Yes    Comment: occasional  . Drug use: No    Family History  Problem Relation Age of Onset  . Mental illness Mother        dementia  . Hypertension Mother   . Hyperlipidemia Mother   . Heart disease Mother   . Thyroid disease Mother   . Depression Mother   . Anxiety disorder Mother   . Schizophrenia Mother   . Arthritis Father   . COPD Father   . Hypertension Father   . Hyperlipidemia Father   . Heart disease Father   . Thyroid disease Father   . Sleep apnea Father   . Alcohol  abuse Father   . Liver disease Father     Review of Systems  Constitutional: negative for fevers, chills and anorexia Gastrointestinal: negative for nausea and diarrhea Integument/breast: negative for rash All other systems reviewed and are negative    Constitutional: in no apparent distress There were no vitals filed for this visit. EYES: anicteric Cardiovascular: Cor RRR Respiratory: clear Musculoskeletal: no pedal edema noted Skin: negatives: no rash Neuro: non-focal  Labs: Lab Results  Component Value Date   WBC 4.4 02/20/2020   HGB 12.9 (L) 02/20/2020   HCT 39.3 02/20/2020   MCV 88 02/20/2020   PLT 309 02/20/2020    Lab Results  Component Value Date   CREATININE 0.97 02/20/2020   BUN 19 02/20/2020   NA 140 02/20/2020   K 4.2 02/20/2020   CL 96 02/20/2020   CO2 23 02/20/2020    Lab Results  Component Value Date   ALT 31 02/20/2020   AST 15 02/20/2020   ALKPHOS 59 02/20/2020   BILITOT 0.4 02/20/2020     Assessment: thoracic discitis/osteomyelitis.  MRI findings reviewed.  Not on antibiotics    Will schedule for IR disc aspiration for bacterial culture After aspiration will start empiric vancomycin and ceftriaxone Will narrow based on any culture growth Follow up in 4 weeks  Plan: 1) as above, disc aspiration then empiric antibiotics.  Will narrow based on culture growth. Will do baseline ESR and CRP.

## 2020-03-04 NOTE — Progress Notes (Signed)
Office: [phone]  /  Fax: [phone]    Date: March 05, 2020   Appointment Start Time: 11:51am Duration: 46 minutes Provider: Lawerance Cruel, Psy.D. Type of Session: Intake for Individual Therapy  Location of Patient: Work Location of Provider: Provider's Home Type of Contact: Telepsychological Visit via MyChart Video Visit  Informed Consent: Prior to proceeding with today's appointment, two pieces of identifying information were obtained. In addition, Orla's physical location at the time of this appointment was obtained as well a phone number he could be reached at in the event of technical difficulties. Sayyid and this provider participated in today's telepsychological service. Of note, today's appointment was switched to a regular telephone call with Bobbie's verbal consent at 11:55am due to an unstable connection.   The provider's role was explained to [name]. The provider reviewed and discussed issues of confidentiality, privacy, and limits therein (e.g., reporting obligations). In addition to verbal informed consent, written informed consent for psychological services was obtained prior to the initial appointment. Since the clinic is not a 24/7 crisis center, mental health emergency resources were shared and this  provider explained MyChart, e-mail, voicemail, and/or other messaging systems should be utilized only for non-emergency reasons. This provider also explained that information obtained during appointments will be placed in Chetan's medical record and relevant information will be shared with other providers at [facility] for coordination of care. Moreover, Jameer agreed information may be shared with other [facility] providers as needed for coordination of care. By signing the service agreement document, Lovell provided written consent for coordination of care. Prior to initiating telepsychological services, Marko completed an informed consent document,  which included the development of a safety plan (i.e., an emergency contact, nearest emergency room, and emergency resources) in the event of an emergency/crisis. Quentavious expressed understanding of the rationale of the safety plan. Jareth verbally acknowledged understanding he is ultimately responsible for understanding his insurance benefits for telepsychological and in-person services. This provider also reviewed confidentiality, as it relates to telepsychological services, as well as the rationale for telepsychological services (i.e., to reduce exposure risk to COVID-19). Oluwatobiloba  acknowledged understanding that appointments cannot be recorded without both party consent and he is aware he is responsible for securing confidentiality on his end of the session. Brayen verbally consented to proceed.  Chief Complaint/HPI: Gloria was referred by Dr. Reuben Likes due to other depression, with emotional eating. Per the note for the initial visit with Dr. Reuben Likes on February 19, 2020, "Itzel is struggling with emotional eating and using food for comfort to the extent that it is negatively impacting his health. He has been working on behavior modification techniques to help reduce his emotional eating and has been initially successful, but the symptoms have returned. He shows no sign of suicidal or homicidal ideations.  He does some fairly frequent emotional eating." The note for the initial appointment with Dr. Reuben Likes indicated the following: "Hagen's habits were reviewed today and are as follows: his desired weight loss is 125 pounds, he has been heavy most of his life, his heaviest weight ever was 385 pounds, he craves chocolate, he snacks frequently in the evenings, he is frequently drinking liquids with calories, he frequently makes poor food choices, he frequently eats larger portions than normal and he struggles with emotional eating." Gabriele's Food and Mood (modified PHQ-9) score on February 19, 2020 was  9.  During today's appointment, Jon was verbally administered a questionnaire assessing various behaviors  related to emotional eating. Khadar endorsed the following: overeat when you are celebrating, experience food cravings on a regular basis, eat certain foods when you are anxious, stressed, depressed, or your feelings are hurt, use food to help you cope with emotional situations, find food is comforting to you, overeat when you are angry or upset, overeat when you are worried about something, overeat when you are alone, but eat much less when you are with other people, eat to help you stay awake and eat as a reward. 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 He described the current frequency of emotional eating as "few times a week." In addition, Matheson denied a history of binge eating. Safir denied a history of restricting food intake, purging and engagement in other compensatory strategies, and has never been diagnosed with an eating disorder. He also denied a history of treatment for emotional eating. Moreover, Oris indicated stress and desire to reward self triggers emotional eating, whereas feeling good about success makes emotional eating better. Furthermore, Finneus reported ongoing health concerns starting in February 2021. He also shared about the loss of his parents, noting his father passed away in 59 and his mother passed away last year.   Mental Status Examination:  Appearance: well groomed and appropriate hygiene  Behavior: appropriate to circumstances Mood: euthymic Affect: mood congruent Speech: normal in rate, volume, and tone Eye Contact: appropriate Psychomotor Activity: appropriate Gait: unable to assess Thought Process: linear, logical, and goal directed  Thought Content/Perception: denies suicidal and homicidal ideation, plan, and intent and no hallucinations, delusions, bizarre thinking or behavior  reported or observed Orientation: time, person, place, and purpose of appointment Memory/Concentration: memory, attention, language, and fund of knowledge intact  Insight/Judgment: good  Family & Psychosocial History: Demetrios reported he is not in a relationship, adding he is divorced. He stated he has two sons, noting one resides in [place] and one resides in [place]. He indicated he is currently employed as a Runner, broadcasting/film/video at [employer]. 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 Currently, Ethen's social support system consists of his sister, ex wife, children, aunt, and uncle. Moreover, Timtohy stated he resides alone.  Medical History:  Past Medical History:  Diagnosis Date  . At risk for sleep apnea    STOP-BANG= 6         SENT TO PCP 06-24-2016  . Back pain   . History of thyroid nodule    s/p  right thyroid lopectomy--  per path report:  follicular adenoma, chronic thyroiditis  . Hyperlipidemia   . Hypertension   . Hypogonadism male   . Hypothyroidism   . Obesity   . Other specified disorders of tendon, right knee    quad tendon tear  . SOB (shortness of breath)   . Vitamin D deficiency   . Wears glasses    Past Surgical History:  Procedure Laterality Date  . COLONOSCOPY  2006  . EYE SURGERY Right age 35   removal glass  . HYPOSPADIAS CORRECTION  age 89  . QUADRICEPS TENDON REPAIR Left 06/28/2016   Procedure: LEFT KNEE REPAIR QUADRICEP TENDON;  Surgeon: Eugenia Mcalpine, MD;  Location: [facility];  Service: Orthopedics;  Laterality: Left;  . THYROID LOBECTOMY Right 02/03/2005   Current Outpatient Medications on File Prior to Visit  Medication Sig Dispense Refill  .  acetaminophen-codeine (TYLENOL #3) 300-30 MG tablet Take 1 tablet by mouth 4 (four) times daily as needed.    Marland Kitchen amLODipine (NORVASC) 5 MG tablet Take 1 tablet (5 mg total) by mouth daily. 90  tablet 3  . aspirin EC 325 MG tablet Take 1 tablet (325 mg total) by mouth 2 (two) times daily. 60 tablet 0  . baclofen (LIORESAL) 10 MG tablet Take 10 mg by mouth 3 (three) times daily as needed.    . baclofen (LIORESAL) 20 MG tablet Take 20 mg by mouth 3 (three) times daily as needed.    . benazepril (LOTENSIN) 20 MG tablet Take 1 tablet (20 mg total) by mouth daily. 90 tablet 3  . Cholecalciferol (VITAMIN D3) 50 MCG (2000 UT) TABS Take by mouth.    . Coenzyme Q10 (COQ10) 100 MG CAPS Take 1 capsule by mouth every morning.    . Cyanocobalamin (B-12) 2000 MCG TABS Take 1 tablet by mouth every morning.    Marland Kitchen levothyroxine (SYNTHROID) 125 MCG tablet TAKE TWO TABLETS BY MOUTH ONCE DAILY BEFORE BREAKFAST 180 tablet 3  . lovastatin (MEVACOR) 40 MG tablet Take 1 tablet (40 mg total) by mouth at bedtime. Must keep Nov appt for future refills 90 tablet 3  . meloxicam (MOBIC) 15 MG tablet Take 1 tablet (15 mg total) by mouth daily. (Patient not taking: Reported on 03/04/2020) 30 tablet 0  . Multiple Vitamin (MULTIVITAMIN) tablet Take 1 tablet by mouth every morning.    . Omega-3 Fatty Acids (FISH OIL) 1000 MG CAPS Take 1 capsule by mouth every morning. (Patient not taking: Reported on 03/04/2020)    . oxyCODONE-acetaminophen (PERCOCET) 10-325 MG tablet Take 1 tablet by mouth 3 (three) times daily as needed.    Marland Kitchen tiZANidine (ZANAFLEX) 4 MG tablet Take 1 tablet (4 mg total) by mouth every 6 (six) hours as needed for muscle spasms. (Patient not taking: Reported on 03/04/2020) 60 tablet 0  . traMADol (ULTRAM) 50 MG tablet Take 50 mg by mouth every 6 (six) hours. (Patient not taking: Reported on 03/04/2020)    . triamterene-hydrochlorothiazide (MAXZIDE-25) 37.5-25 MG tablet TAKE 1 TABLET BY MOUTH ONCE DAILY 90 tablet 3  . Turmeric 500 MG TABS Take 1 capsule by mouth every morning.     No current facility-administered medications on file prior to visit.  Kyi denied a history of head injuries and loss of  consciousness.    Mental Health History: Theotis reported he attended marital counseling around age 43. He denied a history of psychiatric services. Kase reported there is no history of hospitalizations for psychiatric concerns. Jaggar endorsed a family history of mental health related concerns. He stated his mother was diagnosed with dementia. Andrick reported there is no history of trauma including psychological, physical  and sexual abuse, as well as neglect.   Kourtney described his typical mood lately as "hopeful, but concerned" about weight loss and back issue. Meko endorsed occasional alcohol use (1-2 beers every other weekend). He denied tobacco use. He denied illicit/recreational substance use. Regarding caffeine intake, Mathias reported consuming Crystal Light tea regularly and occasionally soda. Furthermore, Robson indicated he is not experiencing the following: hallucinations and delusions, paranoia, symptoms of mania , social withdrawal, crying spells, panic attacks and decreased motivation. He also denied history of and current suicidal ideation, plan, and intent; history of and current homicidal ideation, plan, and intent; and history of and current engagement in self-harm.  The following strengths were reported by Jonny Ruiz: easy going and optimistic. The  following strengths were observed by this provider: ability to express thoughts and feelings during the therapeutic session, ability to establish and benefit from a therapeutic relationship, willingness to work toward established goal(s) with the clinic and ability to engage in reciprocal conversation.   Legal History: Tyger reported there is no history of legal involvement.   Structured Assessments Results: The Patient Health Questionnaire-9 (PHQ-9) is a self-report measure that assesses symptoms and severity of depression over the course of the last two weeks. Nussen obtained a score of 3 suggesting minimal depression. Corliss finds the endorsed symptoms to be  somewhat difficult. [0= Not at all; 1= Several days; 2= More than half the days; 3= Nearly every day] Little interest or pleasure in doing things 0  Feeling down, depressed, or hopeless 0  Trouble falling or staying asleep, or sleeping too much- due to pain 1  Feeling tired or having little energy 1  Poor appetite or overeating 1  Feeling bad about yourself --- or that you are a failure or have let yourself or your family down 0  Trouble concentrating on things, such as reading the newspaper or watching television 0  Moving or speaking so slowly that other people could have noticed? Or the opposite --- being so fidgety or restless that you have been moving around a lot more than usual 0  Thoughts that you would be better off dead or hurting yourself in some way 0  PHQ-9 Score 3    The Generalized Anxiety Disorder-7 (GAD-7) is a brief self-report measure that assesses symptoms of anxiety over the course of the last two weeks. Jacorie obtained a score of 1 suggesting minimal anxiety. Merrik finds the endorsed symptoms to be not difficult at all. [0= Not at all; 1= Several days; 2= Over half the days; 3= Nearly every day] Feeling nervous, anxious, on edge 1  Not being able to stop or control worrying 0  Worrying too much about different things 0  Trouble relaxing 0  Being so restless that it's hard to sit still 0  Becoming easily annoyed or irritable 0  Feeling afraid as if something awful might happen 0  GAD-7 Score 1   Interventions:  Conducted a chart review Focused on rapport building Verbally administered PHQ-9 and GAD-7 for symptom monitoring Verbally administered Food & Mood questionnaire to assess various behaviors related to emotional eating Provided emphatic reflections and validation Collaborated with patient on a treatment goal  Psychoeducation provided regarding physical versus emotional hunger  Provisional DSM-5 Diagnosis(es): 307.59 (F50.8) Other Specified Feeding or Eating  Disorder, Emotional Eating Behaviors  Plan: Kiyon appears able and willing to participate as evidenced by collaboration on a treatment goal, engagement in reciprocal conversation, and asking questions as needed for clarification. The next appointment will be scheduled in approximately two weeks, which will be via MyChart Video Visit. The following treatment goal was established: increase coping skills. This provider will regularly review the treatment plan and medical chart to keep informed of status changes. Andruw expressed understanding and agreement with the initial treatment plan of care. Niccolas will be sent a handout via e-mail to utilize between now and the next appointment to increase awareness of hunger patterns and subsequent eating. Ryker provided verbal consent during today's appointment for this provider to send the handout via e-mail.

## 2020-03-04 NOTE — Telephone Encounter (Addendum)
Per Dr. Luciana Axe called IR to schedule Disc aspiration. Spoke with Morrie Sheldon who will need copy of MRI results done by [facility] before appointment can be reviewed. Morrie Sheldon will call office back with appointment.  Patient will need appointment before he leaves on 7/2. Is planning on returning on 7/8. IF patient is not able to be seen at [facility] IR can schedule after he returns.   Lorenso Courier, [place]

## 2020-03-05 ENCOUNTER — Telehealth (INDEPENDENT_AMBULATORY_CARE_PROVIDER_SITE_OTHER): Payer: BC Managed Care – PPO | Admitting: Psychology

## 2020-03-05 DIAGNOSIS — F5089 Other specified eating disorder: Secondary | ICD-10-CM | POA: Diagnosis not present

## 2020-03-05 LAB — C-REACTIVE PROTEIN: CRP: 44.9 mg/L — ABNORMAL HIGH (ref <8.0)

## 2020-03-05 LAB — SEDIMENTATION RATE: Sed Rate: 58 mm/h — ABNORMAL HIGH (ref 0–20)

## 2020-03-05 NOTE — Telephone Encounter (Signed)
Called [facility] to request copy of MRI to be faxed to triage at [phone]. Left voicemail to call our office with any questions.  Valarie Cones

## 2020-03-06 ENCOUNTER — Ambulatory Visit
Admission: RE | Admit: 2020-03-06 | Discharge: 2020-03-06 | Disposition: A | Discharge status: Home / Self Care | Payer: Self-pay | Source: Ambulatory Visit | Attending: Neuroradiology | Admitting: Neuroradiology

## 2020-03-06 ENCOUNTER — Other Ambulatory Visit (HOSPITAL_COMMUNITY): Payer: Self-pay | Admitting: Neuroradiology

## 2020-03-06 DIAGNOSIS — R52 Pain, unspecified: Secondary | ICD-10-CM

## 2020-03-06 NOTE — Telephone Encounter (Signed)
Left voicemail with IR for update on patient's appointment. Will need to confirm patient has left copy of MRI results at radiology.  Lorenso Courier, [place]

## 2020-03-09 ENCOUNTER — Telehealth (HOSPITAL_COMMUNITY): Payer: Self-pay

## 2020-03-09 ENCOUNTER — Ambulatory Visit: Payer: BC Managed Care – PPO | Admitting: Internal Medicine

## 2020-03-09 NOTE — Telephone Encounter (Signed)
Called to schedule disc aspiration, no answer, left vm. AW

## 2020-03-09 NOTE — Telephone Encounter (Signed)
Called IR to follow up on appointment for aspiration and picc line. Spoke with Morrie Sheldon who scheduled appointment for aspiration on 7/1. States patient would like to hold off on picc line until he returns back from son's wedding.  Patient is supposed to contact office to discuss picc.Attempted to call patient, but was unable to reach him. Left voicemail with patient requesting call back. Lorenso Courier, [place]

## 2020-03-09 NOTE — Progress Notes (Signed)
  Office: [phone]  /  Fax: [phone]    Date: March 23, 2020   Appointment Start Time: 4:00pm Duration: 21 minutes Provider: Lawerance Cruel, Psy.D. Type of Session: Individual Therapy  Location of Patient: Work Location of Provider: Provider's Home Type of Contact: Telepsychological Visit via MyChart Video Visit  Session Content: Randy [name] is a 59 y.o. male presenting via MyChart Video Visit for a follow-up appointment to address the previously established treatment goal of increasing coping skills. Today's appointment was a telepsychological visit due to COVID-19. Randy [name] provided verbal consent for today's telepsychological appointment and he is aware he is responsible for securing confidentiality on his end of the session. Prior to proceeding with today's appointment, Randy [name]'s physical location at the time of this appointment was obtained as well a phone number he could be reached at in the event of technical difficulties. Randy [name] and this provider participated in today's telepsychological service. Of note, today's appointment was switched to a regular telephone call at 4:04pm with Randy [name]'s verbal consent due to an unstable connection.  This provider conducted a brief check-in. Randy [name] stated he has been busy with work and shared about his recent vacation. He discussed experiencing challenges eating all food on the meal plan, which he believes is secondary to medications/medical concerns. Nevertheless, Randy [name] stated he is focusing on making better choices and engaging in portion control. Positive reinforcement was provided. Emotional and physical hunger were reviewed. Psychoeducation regarding triggers for emotional eating was provided. Randy [name] was provided a handout, and encouraged to utilize the handout between now and the next appointment to increase awareness of triggers and frequency. Randy [name] agreed. This provider also discussed behavioral strategies for specific triggers, such as placing the utensil down when  conversing to avoid mindless eating. Randy [name] provided verbal consent during today's appointment for this provider to send a handout about triggers via e-mail. Randy [name] was receptive to today's appointment as evidenced by openness to sharing, responsiveness to feedback, and willingness to explore triggers for emotional eating.  Mental Status Examination:  Appearance: well groomed and appropriate hygiene  Behavior: appropriate to circumstances Mood: euthymic Affect: mood congruent Speech: normal in rate, volume, and tone Eye Contact: appropriate Psychomotor Activity: appropriate Gait: unable to assess Thought Process: linear, logical, and goal directed  Thought Content/Perception: no hallucinations, delusions, bizarre thinking or behavior reported or observed and no evidence of suicidal and homicidal ideation, plan, and intent Orientation: time, person, place, and purpose of appointment Memory/Concentration: memory, attention, language, and fund of knowledge intact  Insight/Judgment: good  Interventions:  Conducted a brief chart review Provided empathic reflections and validation Reviewed content from the previous session Employed supportive psychotherapy interventions to facilitate reduced distress and to improve coping skills with identified stressors Psychoeducation provided regarding triggers for emotional eating  Provided positive reinforcement   DSM-5 Diagnosis(es): 307.59 (F50.8) Other Specified Feeding or Eating Disorder, Emotional Eating Behaviors  Treatment Goal & Progress: During the initial appointment with this provider, the following treatment goal was established: increase coping skills. Randy [name] has demonstrated progress in his goal as evidenced by increased awareness of hunger patterns.   Plan: The next appointment will be scheduled in approximately two weeks, which will be via MyChart Video Visit. The next session will focus on working towards the established treatment goal.

## 2020-03-09 NOTE — Telephone Encounter (Signed)
-----   Message from Baldemar Lenis, MD sent at 03/09/2020  9:24 AM EDT ----- Regarding: RE: Disc Aspiration Yes, can do it. Thanks!  ----- Message ----- From: Anderson Malta Sent: 03/09/2020   8:33 AM EDT To: Baldemar Lenis, MD Subject: Disc Aspiration                                Kat,   Please review for possible disc aspiration.   Dx: Thoracic discitis  Ordering: Dr. Staci Righter [phone] for Infectious Disease)Imaging: MRI thoracic spine uploaded into Epic on 03/06/20  Thanks, Fara Boros

## 2020-03-10 NOTE — Telephone Encounter (Signed)
Spoke with Dr. Drue Second regarding patient's message. MD states that patient will need to do aspiration in order for office to prescribe any antibiotics. Will need to speak with patient on what he would like to do.  Left voicemail requesting patient call office back. Lorenso Courier, [place]

## 2020-03-11 ENCOUNTER — Other Ambulatory Visit: Payer: Self-pay | Admitting: Student

## 2020-03-11 ENCOUNTER — Ambulatory Visit: Payer: BC Managed Care – PPO | Admitting: Internal Medicine

## 2020-03-11 ENCOUNTER — Telehealth (HOSPITAL_COMMUNITY): Payer: Self-pay

## 2020-03-11 NOTE — Telephone Encounter (Signed)
Spoke with patient this morning regarding his decision to cancel aspiration appointment scheduled on 7/1. Informed patient that office would not be able to prescribe antibiotics until this was done. Advised patient to call office one he is back from his trip to reschedule aspiration and picc line.  Patient is okay with this and will follow up once he has returned. Randy [name], [place]

## 2020-03-11 NOTE — Telephone Encounter (Signed)
-----   Message from Baldemar Lenis, MD sent at 03/11/2020  7:56 AM EDT ----- Regarding: RE: Disc Aspiration Sorry, I forgot to mention the level T8-9. I will do bone and disc. Thanks. ----- Message ----- From: Anderson Malta Sent: 03/10/2020   8:49 AM EDT To: Baldemar Lenis, MD Subject: RE: Disc Aspiration                            Kat,   When you get a chance, let me know what level you plan to do. I will let the PA's know for when they go up and see the patient in short stay.  Thanks, Ash ----- Message ----- From: Baldemar Lenis, MD Sent: 03/09/2020   9:24 AM EDT To: Anderson Malta Subject: RE: Disc Aspiration                            Yes, can do it. Thanks!  ----- Message ----- From: Anderson Malta Sent: 03/09/2020   8:33 AM EDT To: Baldemar Lenis, MD Subject: Disc Aspiration                                Kat,   Please review for possible disc aspiration.   Dx: Thoracic discitis  Ordering: Dr. Staci Righter [phone] for Infectious Disease)Imaging: MRI thoracic spine uploaded into Epic on 03/06/20  Thanks, Fara Boros

## 2020-03-12 ENCOUNTER — Ambulatory Visit (HOSPITAL_COMMUNITY): Admission: RE | Admit: 2020-03-12 | Payer: BC Managed Care – PPO | Source: Ambulatory Visit

## 2020-03-12 ENCOUNTER — Ambulatory Visit (INDEPENDENT_AMBULATORY_CARE_PROVIDER_SITE_OTHER): Payer: Self-pay | Admitting: Family Medicine

## 2020-03-20 ENCOUNTER — Other Ambulatory Visit: Payer: Self-pay | Admitting: Internal Medicine

## 2020-03-20 ENCOUNTER — Telehealth: Payer: Self-pay

## 2020-03-20 DIAGNOSIS — M4644 Discitis, unspecified, thoracic region: Secondary | ICD-10-CM

## 2020-03-20 NOTE — Telephone Encounter (Signed)
[facility] called back to confirm orders are still with them. Updated with appointment information. Lorenso Courier, [place]

## 2020-03-20 NOTE — Telephone Encounter (Signed)
Lets push his follow up appt with me to the second week of August then as well.  thanks

## 2020-03-20 NOTE — Telephone Encounter (Signed)
Called patient to reschedule appointment for lumbar aspiration and picc line. Patient will need first dose at short stay same day. 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  Left message with Morrie Sheldon at IR to schedule appointment. Will refax orders to short stay and [facility]. Will update Md. Lorenso Courier, [place]

## 2020-03-20 NOTE — Telephone Encounter (Signed)
Spoke with Randy [name] at [facility] to schedule appt for aspiration and picc line. Is scheduled for 7/20 at 8:30. Patient is okay with appointment date/time. Will call office if he has any questions.  Spoke with Coretta at [facility] who still has orders. Is updated with appointment information. Left voicemail with [facility] to confirm they still have orders for first dose.  Lorenso Courier, [place]

## 2020-03-23 ENCOUNTER — Other Ambulatory Visit: Payer: Self-pay

## 2020-03-23 ENCOUNTER — Telehealth (INDEPENDENT_AMBULATORY_CARE_PROVIDER_SITE_OTHER): Payer: BC Managed Care – PPO | Admitting: Psychology

## 2020-03-23 DIAGNOSIS — F5089 Other specified eating disorder: Secondary | ICD-10-CM | POA: Diagnosis not present

## 2020-03-24 NOTE — Progress Notes (Signed)
Office: [phone]  /  Fax: [phone]    Date: April 07, 2020   Appointment Start Time: 3:56pm Duration: 23 minutes Provider: Lawerance Cruel, Psy.D. Type of Session: Individual Therapy  Location of Patient: Parked in car (provided location in [place]) Location of Provider: [facility] Type of Contact: Telepsychological Visit via MyChart Video Visit  Session Content: Randy [name] is a 59 y.o. male presenting via MyChart Video Visit for a follow-up appointment to address the previously established treatment goal of increasing coping skills. Today's appointment was a telepsychological visit due to COVID-19. Randy [name] provided verbal consent for today's telepsychological appointment and he is aware he is responsible for securing confidentiality on his end of the session. Prior to proceeding with today's appointment, Randy [name] physical location at the time of this appointment was obtained as well a phone number he could be reached at in the event of technical difficulties. Randy [name] and this provider participated in today's telepsychological service. Of note, today's appointment was switched to a regular telephone call at 3:58pm with Randy [name]'s verbal consent due to audio issues.  This provider conducted a brief check-in. Randy [name] shared he had a medical procedure on March 31, 2020. He further shared about his current medication regimen, including feeling anxious about administering an antibiotic via IV at home. To assist with coping, a grounding technique was introduced and he was led through an exercise involving his senses. Randy [name] provided verbal consent during today's appointment for this provider to send a handout for today's exercise via e-mail.   Randy [name] acknowledged deviations from the meal plan in the past two weeks due to stress secondary to the procedure. He shared he is focusing on protein intake. To assist Randy [name] resume eating congruent to his structured meal plan, he was engaged in goal setting.  Psychoeducation regarding SMART goals was provided and Randy [name] was engaged in goal setting. The following goal was established for dinner as it is the most challenging: Randy [name] will eat dinner congruent to his meal plan at least 5 out of 7 days between now and the next appointment with this provider. Randy [name] was receptive to today's appointment as evidenced by openness to sharing, responsiveness to feedback, and willingness to implement discussed strategies.   Mental Status Examination:  Appearance: well groomed and appropriate hygiene  Behavior: appropriate to circumstances Mood: euthymic Affect: mood congruent Speech: normal in rate, volume, and tone Eye Contact: appropriate Psychomotor Activity: appropriate Gait: unable to assess Thought Process: linear, logical, and goal directed  Thought Content/Perception: no hallucinations, delusions, bizarre thinking or behavior reported or observed and no evidence of suicidal and homicidal ideation, plan, and intent Orientation: time, person, place, and purpose of appointment Memory/Concentration: memory, attention, language, and fund of knowledge intact  Insight/Judgment: fair  Interventions:  Conducted a brief chart review Provided empathic reflections and validation Employed supportive psychotherapy interventions to facilitate reduced distress and to improve coping skills with identified stressors Engaged patient in goal setting Psychoeducation provided regarding SMART goals Psychoeducation provided regarding grounding technique Engaged patient in a grounding technique   DSM-5 Diagnosis(es): 307.59 (F50.8) Other Specified Feeding or Eating Disorder, Emotional Eating Behaviors  Treatment Goal & Progress: During the initial appointment with this provider, the following treatment goal was established: increase coping skills. Randy [name] has demonstrated progress in his goal as evidenced by increased awareness of hunger patterns and increased awareness of  triggers for emotional eating.   Plan: The next appointment will be scheduled in 2-3 weeks, which will be via MyChart Video Visit. The next  session will focus on working towards the established treatment goal.

## 2020-03-25 ENCOUNTER — Other Ambulatory Visit: Payer: Self-pay

## 2020-03-25 ENCOUNTER — Ambulatory Visit: Payer: BC Managed Care – PPO | Admitting: Internal Medicine

## 2020-03-25 MED ORDER — LEVOTHYROXINE SODIUM 125 MCG PO TABS: ORAL_TABLET | ORAL | 0 refills | Status: DC

## 2020-03-25 MED ORDER — LOVASTATIN 40 MG PO TABS: 40.0000 mg | ORAL_TABLET | Freq: Every day | ORAL | 0 refills | Status: DC

## 2020-03-25 MED ORDER — BENAZEPRIL HCL 20 MG PO TABS: 20.0000 mg | ORAL_TABLET | Freq: Every day | ORAL | 3 refills | Status: DC

## 2020-03-25 MED ORDER — TRIAMTERENE-HCTZ 37.5-25 MG PO TABS: ORAL_TABLET | ORAL | 0 refills | Status: DC

## 2020-03-25 MED ORDER — MELOXICAM 15 MG PO TABS: 15.0000 mg | ORAL_TABLET | Freq: Every day | ORAL | 0 refills | Status: DC

## 2020-03-25 MED ORDER — AMLODIPINE BESYLATE 5 MG PO TABS: 5.0000 mg | ORAL_TABLET | Freq: Every day | ORAL | 3 refills | Status: DC

## 2020-03-27 MED ORDER — BENAZEPRIL HCL 20 MG PO TABS: 20.0000 mg | ORAL_TABLET | Freq: Every day | ORAL | 3 refills | Status: DC

## 2020-03-27 NOTE — Addendum Note (Signed)
Addended by: Deatra James on: 03/27/2020 09:32 AM   Modules accepted: Orders

## 2020-03-30 ENCOUNTER — Other Ambulatory Visit: Payer: Self-pay | Admitting: Student

## 2020-03-30 ENCOUNTER — Other Ambulatory Visit (HOSPITAL_COMMUNITY): Payer: Self-pay

## 2020-03-31 ENCOUNTER — Ambulatory Visit (HOSPITAL_COMMUNITY)
Admission: RE | Admit: 2020-03-31 | Discharge: 2020-03-31 | Disposition: A | Discharge status: Home / Self Care | Payer: BC Managed Care – PPO | Source: Ambulatory Visit | Attending: Internal Medicine | Admitting: Internal Medicine

## 2020-03-31 ENCOUNTER — Other Ambulatory Visit: Payer: Self-pay | Admitting: Internal Medicine

## 2020-03-31 ENCOUNTER — Encounter (HOSPITAL_COMMUNITY): Payer: Self-pay

## 2020-03-31 ENCOUNTER — Inpatient Hospital Stay (HOSPITAL_COMMUNITY)
Admission: RE | Admit: 2020-03-31 | Discharge: 2020-03-31 | Disposition: A | Discharge status: Home / Self Care | Payer: BC Managed Care – PPO | Source: Ambulatory Visit | Attending: Internal Medicine | Admitting: Internal Medicine

## 2020-03-31 ENCOUNTER — Other Ambulatory Visit: Payer: Self-pay

## 2020-03-31 DIAGNOSIS — E669 Obesity, unspecified: Secondary | ICD-10-CM | POA: Diagnosis not present

## 2020-03-31 DIAGNOSIS — I10 Essential (primary) hypertension: Secondary | ICD-10-CM | POA: Diagnosis not present

## 2020-03-31 DIAGNOSIS — M4644 Discitis, unspecified, thoracic region: Secondary | ICD-10-CM

## 2020-03-31 DIAGNOSIS — Z79899 Other long term (current) drug therapy: Secondary | ICD-10-CM | POA: Diagnosis not present

## 2020-03-31 DIAGNOSIS — Z6841 Body Mass Index (BMI) 40.0 and over, adult: Secondary | ICD-10-CM | POA: Insufficient documentation

## 2020-03-31 DIAGNOSIS — E785 Hyperlipidemia, unspecified: Secondary | ICD-10-CM | POA: Diagnosis not present

## 2020-03-31 DIAGNOSIS — E039 Hypothyroidism, unspecified: Secondary | ICD-10-CM | POA: Insufficient documentation

## 2020-03-31 HISTORY — PX: IR FLUORO GUIDE CV LINE RIGHT: IMG2283

## 2020-03-31 HISTORY — PX: IR CERVICAL/THORACIC DISC ASPIRATION W/IMAG GUIDE: IMG5397

## 2020-03-31 LAB — CBC
HCT: 39.7 % (ref 39.0–52.0)
Hemoglobin: 13.1 g/dL (ref 13.0–17.0)
MCH: 29.1 pg (ref 26.0–34.0)
MCHC: 33 g/dL (ref 30.0–36.0)
MCV: 88.2 fL (ref 80.0–100.0)
Platelets: 279 10*3/uL (ref 150–400)
RBC: 4.5 MIL/uL (ref 4.22–5.81)
RDW: 15.1 % (ref 11.5–15.5)
WBC: 9.3 10*3/uL (ref 4.0–10.5)
nRBC: 0 % (ref 0.0–0.2)

## 2020-03-31 LAB — PROTIME-INR
INR: 1 (ref 0.8–1.2)
Prothrombin Time: 12.7 seconds (ref 11.4–15.2)

## 2020-03-31 MED ORDER — LIDOCAINE HCL 1 % IJ SOLN
INTRAMUSCULAR | Status: AC
  Filled 2020-03-31: qty 20

## 2020-03-31 MED ORDER — MIDAZOLAM HCL 2 MG/2ML IJ SOLN
INTRAMUSCULAR | Status: AC
  Filled 2020-03-31: qty 2

## 2020-03-31 MED ORDER — HEPARIN SOD (PORK) LOCK FLUSH 100 UNIT/ML IV SOLN
INTRAVENOUS | Status: DC | PRN
  Administered 2020-03-31: 500 [IU] via INTRAVENOUS

## 2020-03-31 MED ORDER — HEPARIN SOD (PORK) LOCK FLUSH 100 UNIT/ML IV SOLN
INTRAVENOUS | Status: AC
  Filled 2020-03-31: qty 5

## 2020-03-31 MED ORDER — LIDOCAINE HCL 1 % IJ SOLN
INTRAMUSCULAR | Status: DC | PRN
  Administered 2020-03-31: 15 mL
  Administered 2020-03-31: 10 mL

## 2020-03-31 MED ORDER — VANCOMYCIN HCL IN DEXTROSE 1-5 GM/200ML-% IV SOLN
1000.0000 mg | Freq: Once | INTRAVENOUS | Status: AC
  Administered 2020-03-31: 1000 mg via INTRAVENOUS
  Filled 2020-03-31: qty 200

## 2020-03-31 MED ORDER — SODIUM CHLORIDE 0.9 % IV SOLN: INTRAVENOUS | Status: DC

## 2020-03-31 MED ORDER — FENTANYL CITRATE (PF) 100 MCG/2ML IJ SOLN
INTRAMUSCULAR | Status: AC
  Filled 2020-03-31: qty 2

## 2020-03-31 MED ORDER — MIDAZOLAM HCL 2 MG/2ML IJ SOLN
INTRAMUSCULAR | Status: AC | PRN
  Administered 2020-03-31 (×2): 0.5 mg via INTRAVENOUS
  Administered 2020-03-31: 1 mg via INTRAVENOUS

## 2020-03-31 MED ORDER — FENTANYL CITRATE (PF) 100 MCG/2ML IJ SOLN
INTRAMUSCULAR | Status: AC | PRN
  Administered 2020-03-31: 25 ug via INTRAVENOUS
  Administered 2020-03-31: 50 ug via INTRAVENOUS
  Administered 2020-03-31: 25 ug via INTRAVENOUS

## 2020-03-31 MED ORDER — LIDOCAINE HCL 1 % IJ SOLN
INTRAMUSCULAR | Status: DC | PRN
  Administered 2020-03-31: 10 mL

## 2020-03-31 MED ORDER — SODIUM CHLORIDE 0.9 % IV SOLN
2.0000 g | INTRAVENOUS | Status: AC
  Administered 2020-03-31: 2 g via INTRAVENOUS
  Filled 2020-03-31: qty 2

## 2020-03-31 NOTE — Consult Note (Signed)
Chief Complaint: Back pain / Discitis - Thoracic disc aspiration & PICC line insertion  Referring Physician(s): Dr. Wiliam Ke  Supervising Physician: Baldemar Lenis  Patient Status: [facility] - Out-pt  History of Present Illness: Randy [name] is a 59 y.o. male  istory of HTN, HLD, hypothyroidism, obesity. Patient  was diagnosed with lower extremity cellulitis  in February 2021 followed by lower back pain. A thoracic MRI  Was performed on  6.14.21  Which indicated thoracic discitis.  Patient was then referred Infectious disease. Team is requesting thoracic disc aspiration for treatment planning and PICC line insertion for antibiotic administration.     Past Medical History:  Diagnosis Date  . At risk for sleep apnea    STOP-BANG= 6         SENT TO PCP 06-24-2016  . Back pain   . History of thyroid nodule    s/p  right thyroid lopectomy--  per path report:  follicular adenoma, chronic thyroiditis  . Hyperlipidemia   . Hypertension   . Hypogonadism male   . Hypothyroidism   . Obesity   . Other specified disorders of tendon, right knee    quad tendon tear  . SOB (shortness of breath)   . Vitamin D deficiency   . Wears glasses     Past Surgical History:  Procedure Laterality Date  . COLONOSCOPY  2006  . EYE SURGERY Right age 44   removal glass  . HYPOSPADIAS CORRECTION  age 20  . QUADRICEPS TENDON REPAIR Left 06/28/2016   Procedure: LEFT KNEE REPAIR QUADRICEP TENDON;  Surgeon: Eugenia Mcalpine, MD;  Location: [facility];  Service: Orthopedics;  Laterality: Left;  . THYROID LOBECTOMY Right 02/03/2005    Allergies: Penicillins and Watermelon [citrullus vulgaris]  Medications: Prior to Admission medications   Medication Sig Start Date End Date Taking? Authorizing Provider  acetaminophen-codeine (TYLENOL #3) 300-30 MG tablet Take 1 tablet by mouth 4 (four) times daily as needed for severe pain.  02/29/20  Yes [provider]    amLODipine (NORVASC) 5 MG tablet Take 1 tablet (5 mg total) by mouth daily. 03/25/20  Yes Plotnikov, Georgina Quint, MD  Ascorbic Acid (VITAMIN C WITH ROSE HIPS) 1000 MG tablet Take 5,000 mg by mouth daily.   Yes [provider]  aspirin EC 81 MG tablet Take 81 mg by mouth daily. Swallow whole.   Yes [provider]  baclofen (LIORESAL) 20 MG tablet Take 20 mg by mouth 3 (three) times daily as needed for muscle spasms.  02/26/20  Yes [provider]  benazepril (LOTENSIN) 20 MG tablet Take 1 tablet (20 mg total) by mouth daily. 03/27/20  Yes Plotnikov, Georgina Quint, MD  Cholecalciferol (VITAMIN D3) 50 MCG (2000 UT) TABS Take 2,000 Units by mouth daily.    Yes [provider]  Coenzyme Q10 (COQ10) 100 MG CAPS Take 1 capsule by mouth every morning.   Yes [provider]  Cyanocobalamin (B-12) 2000 MCG TABS Take 1 tablet by mouth every morning.   Yes [provider]  ibuprofen (ADVIL) 200 MG tablet Take 800 mg by mouth 3 (three) times daily.   Yes [provider]  levothyroxine (SYNTHROID) 125 MCG tablet TAKE TWO TABLETS BY MOUTH ONCE DAILY BEFORE BREAKFAST. PT NEEDS OV FOR FUTURE REFILLS 03/25/20  Yes Plotnikov, Georgina Quint, MD  lovastatin (MEVACOR) 40 MG tablet Take 1 tablet (40 mg total) by mouth at bedtime. Pt need appt for future refills 03/25/20  Yes  Plotnikov, Georgina Quint, MD  Multiple Vitamin (MULTIVITAMIN) tablet Take 1 tablet by mouth every morning.   Yes [provider]  oxyCODONE-acetaminophen (PERCOCET) 10-325 MG tablet Take 1 tablet by mouth at bedtime.  02/25/20  Yes [provider]  triamterene-hydrochlorothiazide (MAXZIDE-25) 37.5-25 MG tablet TAKE 1 TABLET BY MOUTH ONCE DAILY. PT NEEDS APPT FOR FUTURE REFILLS 03/25/20  Yes Plotnikov, Georgina Quint, MD  Turmeric 500 MG TABS Take 1 capsule by mouth every morning.   Yes [provider]  meloxicam (MOBIC) 15 MG tablet Take 1 tablet (15 mg total) by mouth daily. 03/25/20    Plotnikov, Georgina Quint, MD  tiZANidine (ZANAFLEX) 4 MG tablet Take 1 tablet (4 mg total) by mouth every 6 (six) hours as needed for muscle spasms. Patient not taking: Reported on 03/04/2020 11/06/19   Corwin Levins, MD     Family History  Problem Relation Age of Onset  . Mental illness Mother        dementia  . Hypertension Mother   . Hyperlipidemia Mother   . Heart disease Mother   . Thyroid disease Mother   . Depression Mother   . Anxiety disorder Mother   . Schizophrenia Mother   . Arthritis Father   . COPD Father   . Hypertension Father   . Hyperlipidemia Father   . Heart disease Father   . Thyroid disease Father   . Sleep apnea Father   . Alcohol abuse Father   . Liver disease Father     Social History   Socioeconomic History  . Marital status: Divorced    Spouse name: Not on file  . Number of children: Not on file  . Years of education: Not on file  . Highest education level: Not on file  Occupational History  . Occupation: Runner, broadcasting/film/video  Tobacco Use  . Smoking status: Never Smoker  . Smokeless tobacco: Never Used  Vaping Use  . Vaping Use: Never used  Substance and Sexual Activity  . Alcohol use: Not Currently    Alcohol/week: 1.0 standard drink    Types: 1 Cans of beer per week    Comment: occasional  . Drug use: No  . Sexual activity: Not on file  Other Topics Concern  . Not on file  Social History Narrative  . Not on file   Social Determinants of Health   Financial Resource Strain:   . Difficulty of Paying Living Expenses:   Food Insecurity:   . Worried About Programme researcher, broadcasting/film/video in the Last Year:   . Barista in the Last Year:   Transportation Needs:   . Freight forwarder (Medical):   Marland Kitchen Lack of Transportation (Non-Medical):   Physical Activity:   . Days of Exercise per Week:   . Minutes of Exercise per Session:   Stress:   . Feeling of Stress :   Social Connections:   . Frequency of Communication with Friends and Family:   . Frequency  of Social Gatherings with Friends and Family:   . Attends Religious Services:   . Active Member of Clubs or Organizations:   . Attends Banker Meetings:   Marland Kitchen Marital Status:    Review of Systems: A 12 point ROS discussed and pertinent positives are indicated in the HPI above.  All other systems are negative.  Review of Systems  Constitutional: Negative for fever.  HENT: Negative for congestion.   Respiratory: Negative for cough and shortness of breath.   Cardiovascular:  Negative for chest pain.  Gastrointestinal: Negative for abdominal pain.  Musculoskeletal: Positive for arthralgias ( in left lower leg while laying on back for to long. ).  Neurological: Positive for numbness ( left lower back when laying on back to long). Negative for headaches.  Psychiatric/Behavioral: Negative for behavioral problems and confusion.    Vital Signs: BP 127/68   Pulse 81   Temp 98.2 F (36.8 C) (Oral)   Resp 18   Ht 6' (1.829 m)   Wt (!) 335 lb (152 kg)   SpO2 96%   BMI 45.43 kg/m   Physical Exam Vitals and nursing note reviewed.  Constitutional:      Appearance: He is well-developed.  HENT:     Head: Normocephalic.  Cardiovascular:     Rate and Rhythm: Normal rate and regular rhythm.     Heart sounds: Normal heart sounds.  Pulmonary:     Effort: Pulmonary effort is normal.     Breath sounds: Normal breath sounds.  Musculoskeletal:        General: Normal range of motion.     Cervical back: Normal range of motion.  Skin:    General: Skin is dry.  Neurological:     Mental Status: He is alert and oriented to person, place, and time.     Imaging: No results found.  Labs:  CBC: Recent Labs    09/27/19 0837 02/20/20 1136  WBC 5.0 4.4  HGB 14.9 12.9*  HCT 44.6 39.3  PLT 245.0 309    COAGS: No results for input(s): INR, APTT in the last 8760 hours.  BMP: Recent Labs    09/27/19 0837 02/20/20 1136  NA 137 140  K 4.0 4.2  CL 102 96  CO2 27 23  GLUCOSE  103* 88  BUN 16 19  CALCIUM 9.2 9.0  CREATININE 1.01 0.97  GFRNONAA  --  84  GFRAA  --  98    LIVER FUNCTION TESTS: Recent Labs    09/27/19 0837 02/20/20 1136  BILITOT 0.7 0.4  AST 15 15  ALT 25 31  ALKPHOS 47 59  PROT 6.4 6.6  ALBUMIN 4.1 3.7*    Assessment and Plan:  59 y.o, male outpatient. History of HTN, HLD, hypothyroidism, obesity. Patient  was diagnosed with lower extremity cellulitis  in February 2021 followed by lower back pain. A thoracic MRI  Was performed on  6.14.21  Which indicated thoracic discitis.  Patient was then referred Infectious disease. Team is requesting thoracic disc aspiration for treatment planning and PICC line insertion for antibiotic administration.  Patient describes the pain as "scissors stabbing your back followed by muscle burning" Pain is resolved with pain medication.  Patient denies any trauma to the affected area and is unclear regarding fevers due to daily adminstraion of ibuprofen.      All labs and medications are within acceptable parameters.  Patient is afebrile.   Risks and benefits of thoracic  was discussed with the patient and/or patient's and  family including, but not limited to bleeding, infection, damage to adjacent structures or low yield requiring additional tests.  All of the questions were answered and there is agreement to proceed.  Consent signed and in chart.   Thank you for this interesting consult.  I greatly enjoyed meeting Randy [name] and look forward to participating in their care.  A copy of this report was sent to the requesting provider on this date.  Electronically Signed: Alene Mires, NP 03/31/2020, 7:15 AM  I spent a total of  40 Minutes   in face to face in clinical consultation, greater than 50% of which was counseling/coordinating care for Thoracic disc aspiration and PICC line insertion,

## 2020-03-31 NOTE — Procedures (Signed)
INTERVENTIONAL NEURORADIOLOGY BRIEF POSTPROCEDURE NOTE  Fluoroscopy guided disc aspiration  Attending: Dr. Baldemar Lenis  Assistant: None  Diagnosis: T8-9 discitis  Access site: Percutaneous  Anesthesia: Moderate sedation  Medication used: 2 mg Versed IV; 100 mcg Fentanyl IV.  Complications: None  Estimated blood loss: None  Specimen: Blood tinged aspirate  Findings: T8-9 endplate erosion. FNA performed; 3 samples collected.  The patient tolerated the procedure well without incident or complication and is in stable condition.

## 2020-03-31 NOTE — Procedures (Signed)
°  Procedure: L arm PICC placement   EBL:   minimal Complications:  none immediate  See full dictation in [employer].  Thora Lance MD Main # [phone] Pager  [phone]

## 2020-03-31 NOTE — Discharge Instructions (Addendum)
PICC Insertion, Care After  This sheet gives you information about how to care for yourself after your procedure. Your health care provider may also give you more specific instructions. If you have problems or questions, contact your health care provider. What can I expect after the procedure? After your procedure, it is common to have mild discomfort at the insertion site. Follow these instructions at home: Activity  Rest as told by your health care provider. Ask your health care provider when you can return to your normal activities.  If your PICC is near or at the bend of your elbow, avoid activity with repeated motion at the elbow.  Do not lift anything that is heavier than 10 lb (4.5 kg), or the limit that you are told, until your health care provider says that it is safe.  Avoid using a crutch with the arm on the same side as your PICC. You may need to use a walker.  Avoid any activity that requires great effort as told by your health care provider. Bathing  Do not take baths, swim, or use a hot tub until your health care provider approves. Ask your health care provider if you can take showers. You may only be allowed to take sponge baths for bathing.  Keep the bandage (dressing) dry until your health care provider says it can be removed. General instructions  Do not drive for 24 hours if you were given a medicine to help you relax (sedative).  Take over-the-counter and prescription medicines only as told by your health care provider.  Keep all follow-up visits as told by your health care provider. This is important. Contact a health care provider if:  You have a fever or chills.  You have more redness, swelling, or pain around the insertion site.  You have fluid or blood coming from the insertion site.  Your insertion site feels warm to the touch.  You have pus or a bad smell coming from the insertion site.  Your vein feels hard and raised like a "cord" under the skin  around the insertion site. Get help right away if:  You have swelling in the arm in which the PICC was inserted.  You have numbness or tingling in your fingers, hand, or arm.  You have a red streak going up your arm from where the PICC was inserted.  Your PICC cannot be flushed, is hard to flush, or leaks around the insertion site when flushed.  You have pain in your arm, ear, face, or teeth.  Your PICC is accidentally pulled all the way out. If this happens, cover the insertion site with a bandage or gauze dressing. Do not throw the PICC away. Your health care provider will need to check it.  Your PICC was tugged or pulled and has partially come out. Do not push the PICC back in.  You hear a "flushing" sound when the PICC is flushed.  You feel your heart racing or skipping beats.  There is a hole or tear in the PICC.  You have pain in your chest.  You have shortness of breath or difficulty breathing. Summary  After your procedure, it is common to have mild discomfort at the insertion site.  Do not lift anything that is heavier than 10 lb (4.5 kg), or the limit that you are told, until your health care provider says that it is safe.  Flush the PICC as told by your health care provider. Let your health care provider know right  away if the PICC is hard to flush or does not flush. Do not use force to flush the PICC.  Check your insertion site every day for signs of infection. This information is not intended to replace advice given to you by your health care provider. Make sure you discuss any questions you have with your health care provider. Document Revised: 08/11/2017 Document Reviewed: 10/24/2016 Elsevier Patient Education  2020 [employer] Inc. Moderate Conscious Sedation, Adult Sedation is the use of medicines to promote relaxation and relieve discomfort and anxiety. Moderate conscious sedation is a type of sedation. Under moderate conscious sedation, you are less alert than  normal, but you are still able to respond to instructions, touch, or both. Moderate conscious sedation is used during short medical and dental procedures. It is milder than deep sedation, which is a type of sedation under which you cannot be easily woken up. It is also milder than general anesthesia, which is the use of medicines to make you unconscious. Moderate conscious sedation allows you to return to your regular activities sooner. Tell a health care provider about:  Any allergies you have.  All medicines you are taking, including vitamins, herbs, eye drops, creams, and over-the-counter medicines.  Use of steroids (by mouth or creams).  Any problems you or family members have had with sedatives and anesthetic medicines.  Any blood disorders you have.  Any surgeries you have had.  Any medical conditions you have, such as sleep apnea.  Whether you are pregnant or may be pregnant.  Any use of cigarettes, alcohol, marijuana, or street drugs. What are the risks? Generally, this is a safe procedure. However, problems may occur, including:  Getting too much medicine (oversedation).  Nausea.  Allergic reaction to medicines.  Trouble breathing. If this happens, a breathing tube may be used to help with breathing. It will be removed when you are awake and breathing on your own.  Heart trouble.  Lung trouble. What happens before the procedure? Staying hydrated Follow instructions from your health care provider about hydration, which may include:  Up to 2 hours before the procedure - you may continue to drink clear liquids, such as water, clear fruit juice, black coffee, and plain tea. Eating and drinking restrictions Follow instructions from your health care provider about eating and drinking, which may include:  8 hours before the procedure - stop eating heavy meals or foods such as meat, fried foods, or fatty foods.  6 hours before the procedure - stop eating light meals or  foods, such as toast or cereal.  6 hours before the procedure - stop drinking milk or drinks that contain milk.  2 hours before the procedure - stop drinking clear liquids. Medicine Ask your health care provider about:  Changing or stopping your regular medicines. This is especially important if you are taking diabetes medicines or blood thinners.  Taking medicines such as aspirin and ibuprofen. These medicines can thin your blood. Do not take these medicines before your procedure if your health care provider instructs you not to.  Tests and exams  You will have a physical exam.  You may have blood tests done to show: ? How well your kidneys and liver are working. ? How well your blood can clot. General instructions  Plan to have someone take you home from the hospital or clinic.  If you will be going home right after the procedure, plan to have someone with you for 24 hours. What happens during the procedure?  An   IV tube will be inserted into one of your veins.  Medicine to help you relax (sedative) will be given through the IV tube.  The medical or dental procedure will be performed. What happens after the procedure?  Your blood pressure, heart rate, breathing rate, and blood oxygen level will be monitored often until the medicines you were given have worn off.  Do not drive for 24 hours. This information is not intended to replace advice given to you by your health care provider. Make sure you discuss any questions you have with your health care provider. Document Revised: 08/11/2017 Document Reviewed: 12/19/2015 Elsevier Patient Education  2020 [employer].

## 2020-04-01 ENCOUNTER — Other Ambulatory Visit: Payer: Self-pay | Admitting: Internal Medicine

## 2020-04-01 ENCOUNTER — Encounter (HOSPITAL_COMMUNITY): Payer: Self-pay

## 2020-04-01 DIAGNOSIS — M4644 Discitis, unspecified, thoracic region: Secondary | ICD-10-CM

## 2020-04-01 LAB — ACID FAST SMEAR (AFB, MYCOBACTERIA): Acid Fast Smear: NEGATIVE

## 2020-04-05 LAB — AEROBIC/ANAEROBIC CULTURE W GRAM STAIN (SURGICAL/DEEP WOUND): Culture: NO GROWTH

## 2020-04-07 ENCOUNTER — Telehealth (INDEPENDENT_AMBULATORY_CARE_PROVIDER_SITE_OTHER): Payer: BC Managed Care – PPO | Admitting: Psychology

## 2020-04-07 DIAGNOSIS — F5089 Other specified eating disorder: Secondary | ICD-10-CM

## 2020-04-08 ENCOUNTER — Ambulatory Visit: Payer: BC Managed Care – PPO | Admitting: Internal Medicine

## 2020-04-09 NOTE — Progress Notes (Signed)
  Office: [phone]  /  Fax: [phone]    Date: April 23, 2020   Appointment Start Time: 3:58pm Duration: 32 minutes Provider: Lawerance Cruel, Psy.D. Type of Session: Individual Therapy  Location of Patient: Work Location of Provider: Provider's Home Type of Contact: Telepsychological Visit via MyChart Video Visit  Session Content: Randy [name] is a 59 y.o. male presenting via MyChart Video Visit for a follow-up appointment to address the previously established treatment goal of increasing coping skills. Today's appointment was a telepsychological visit due to COVID-19. Randy [name] provided verbal consent for today's telepsychological appointment and he is aware he is responsible for securing confidentiality on his end of the session. Prior to proceeding with today's appointment, Randy [name]'s physical location at the time of this appointment was obtained as well a phone number he could be reached at in the event of technical difficulties. Randy [name] and this provider participated in today's telepsychological service. Of note, today's appointment was switched to a regular telephone call at 4:00pm with Randy [name]'s verbal consent due to technical issues.   This provider conducted a brief check-in. Randy [name] shared his infection is improving. He indicated recent gain, but believes it may be due to fluid retention. Randy [name] was encouraged to call the clinic to schedule a follow-up appointment with Randy [name]; he agreed. Regarding eating, Randy [name] stated his eating has not been routine due to his current schedule. As such, psychoeducation regarding the consequences of not eating regularly was provided. Additionally, he was engaged in problem solving. Randy [name] was receptive to taking chopped vegetables, frozen meals, yogurt, etc. that he can leave at work in bulk, especially for days that he may ended up working later than anticipated. Moreover, this provider and Randy [name] reviewed the previously established SMART goal for dinner. He acknowledged  challenges meeting the goal due to the changes in his schedule; therefore, he was encouraged to focus on planning ahead for work days [as noted above] to ensure he is eating regularly between now and the next appointment with this provider. He agreed. Randy [name] was receptive to today's appointment as evidenced by openness to sharing, responsiveness to feedback, and willingness to implement discussed strategies .  Mental Status Examination:  Appearance: well groomed and appropriate hygiene  Behavior: appropriate to circumstances Mood: euthymic Affect: mood congruent Speech: normal in rate, volume, and tone Eye Contact: appropriate Psychomotor Activity: appropriate Gait: unable to assess Thought Process: linear, logical, and goal directed  Thought Content/Perception: no hallucinations, delusions, bizarre thinking or behavior reported or observed and no evidence of suicidal and homicidal ideation, plan, and intent Orientation: time, person, place, and purpose of appointment Memory/Concentration: memory, attention, language, and fund of knowledge intact  Insight/Judgment: fair  Interventions:  Conducted a brief chart review Provided empathic reflections and validation Reviewed content from the previous session Employed supportive psychotherapy interventions to facilitate reduced distress and to improve coping skills with identified stressors Engaged patient in problem solving  DSM-5 Diagnosis(es): 307.59 (F50.8) Other Specified Feeding or Eating Disorder, Emotional Eating Behaviors  Treatment Goal & Progress: During the initial appointment with this provider, the following treatment goal was established: increase coping skills. [name] has demonstrated progress in his goal as evidenced by increased awareness of hunger patterns and increased awareness of triggers for emotional eating. General also continues to demonstrate willingness to engage in learned skill(s).  Plan: The next appointment will be  scheduled in 2-3 weeks, which will be via MyChart Video Visit. The next session will focus on working towards the established treatment goal.

## 2020-04-16 ENCOUNTER — Ambulatory Visit: Payer: BC Managed Care – PPO | Admitting: Internal Medicine

## 2020-04-21 LAB — CULTURE, FUNGUS WITHOUT SMEAR

## 2020-04-22 ENCOUNTER — Other Ambulatory Visit: Payer: Self-pay

## 2020-04-22 ENCOUNTER — Encounter: Payer: Self-pay | Admitting: Internal Medicine

## 2020-04-22 ENCOUNTER — Ambulatory Visit (INDEPENDENT_AMBULATORY_CARE_PROVIDER_SITE_OTHER): Payer: BC Managed Care – PPO | Admitting: Internal Medicine

## 2020-04-22 VITALS — HR 74 | Temp 98.0°F | Wt 324.0 lb

## 2020-04-22 DIAGNOSIS — Z452 Encounter for adjustment and management of vascular access device: Secondary | ICD-10-CM | POA: Diagnosis not present

## 2020-04-22 DIAGNOSIS — Z5181 Encounter for therapeutic drug level monitoring: Secondary | ICD-10-CM

## 2020-04-22 DIAGNOSIS — M4644 Discitis, unspecified, thoracic region: Secondary | ICD-10-CM

## 2020-04-22 NOTE — Assessment & Plan Note (Signed)
Creat has increased a small amount and vancomycin trough was up to 20 and now he is on it once a day.  Will continue to monitor.

## 2020-04-22 NOTE — Assessment & Plan Note (Signed)
Seems to be improving overall and inflammatory markers are trending down appropriately with the CRP from 154 down to < 5 and ESR from 63 to 40.  Will continue to trend and plan on 8 weeks through September 13.   He will follow up at that time with my partner and if he continues to do well will stop on September 13th; he can stop a bit earlier if there are any issues otherwise.

## 2020-04-22 NOTE — Progress Notes (Signed)
   Subjective:    Patient ID: Randy [name], male    DOB: 1960/11/28, 59 y.o.   MRN: [id number]  HPI Here for follow up of discitis.   I saw him in June with MRI findings of T8-9 diffuse marrow signal concerning for discitis.  He was having some progressive pain and after some delay he got an aspiration and started on vancomycin and ceftriaxone.  He has been on this for about 3 weeks, starting on 7/20.  He is tolerating it well.  Biopsy culture unfortunately remained negative.  His pain is improved, though does have some intermittent episodes of pain.  No associated rash or diarrhea.    Review of Systems  Constitutional: Negative for fatigue and fever.  Gastrointestinal: Negative for diarrhea and nausea.  Skin: Negative for rash.       Objective:   Physical Exam Constitutional:      Appearance: Normal appearance.  Eyes:     General: No scleral icterus. Pulmonary:     Effort: Pulmonary effort is normal.  Skin:    Findings: No rash.  Neurological:     Mental Status: He is alert.  Psychiatric:        Mood and Affect: Mood normal.   SH: no tobacco        Assessment & Plan:

## 2020-04-22 NOTE — Assessment & Plan Note (Signed)
This is working well, in place, no issues.

## 2020-04-23 ENCOUNTER — Telehealth (INDEPENDENT_AMBULATORY_CARE_PROVIDER_SITE_OTHER): Payer: BC Managed Care – PPO | Admitting: Psychology

## 2020-04-23 DIAGNOSIS — F5089 Other specified eating disorder: Secondary | ICD-10-CM | POA: Diagnosis not present

## 2020-04-26 ENCOUNTER — Ambulatory Visit (HOSPITAL_COMMUNITY)
Admission: EM | Admit: 2020-04-26 | Discharge: 2020-04-26 | Discharge status: Against Medical Advice | Payer: BC Managed Care – PPO

## 2020-04-26 ENCOUNTER — Inpatient Hospital Stay (HOSPITAL_COMMUNITY)
Admission: EM | Admit: 2020-04-26 | Discharge: 2020-04-28 | DRG: 149 | Disposition: A | Discharge status: Home Health | Payer: BC Managed Care – PPO | Attending: Internal Medicine | Admitting: Internal Medicine

## 2020-04-26 ENCOUNTER — Encounter (HOSPITAL_COMMUNITY): Payer: Self-pay | Admitting: Emergency Medicine

## 2020-04-26 ENCOUNTER — Other Ambulatory Visit: Payer: Self-pay

## 2020-04-26 ENCOUNTER — Emergency Department (HOSPITAL_COMMUNITY): Payer: BC Managed Care – PPO

## 2020-04-26 DIAGNOSIS — H8112 Benign paroxysmal vertigo, left ear: Secondary | ICD-10-CM | POA: Diagnosis present

## 2020-04-26 DIAGNOSIS — E039 Hypothyroidism, unspecified: Secondary | ICD-10-CM | POA: Diagnosis present

## 2020-04-26 DIAGNOSIS — R42 Dizziness and giddiness: Secondary | ICD-10-CM | POA: Diagnosis not present

## 2020-04-26 DIAGNOSIS — E559 Vitamin D deficiency, unspecified: Secondary | ICD-10-CM | POA: Diagnosis present

## 2020-04-26 DIAGNOSIS — I10 Essential (primary) hypertension: Secondary | ICD-10-CM | POA: Diagnosis present

## 2020-04-26 DIAGNOSIS — Z8349 Family history of other endocrine, nutritional and metabolic diseases: Secondary | ICD-10-CM

## 2020-04-26 DIAGNOSIS — Z791 Long term (current) use of non-steroidal anti-inflammatories (NSAID): Secondary | ICD-10-CM

## 2020-04-26 DIAGNOSIS — Z880 Allergy status to penicillin: Secondary | ICD-10-CM

## 2020-04-26 DIAGNOSIS — M4644 Discitis, unspecified, thoracic region: Secondary | ICD-10-CM | POA: Diagnosis present

## 2020-04-26 DIAGNOSIS — Z811 Family history of alcohol abuse and dependence: Secondary | ICD-10-CM

## 2020-04-26 DIAGNOSIS — H8113 Benign paroxysmal vertigo, bilateral: Secondary | ICD-10-CM

## 2020-04-26 DIAGNOSIS — H8110 Benign paroxysmal vertigo, unspecified ear: Secondary | ICD-10-CM | POA: Diagnosis not present

## 2020-04-26 DIAGNOSIS — Z825 Family history of asthma and other chronic lower respiratory diseases: Secondary | ICD-10-CM

## 2020-04-26 DIAGNOSIS — Z7982 Long term (current) use of aspirin: Secondary | ICD-10-CM

## 2020-04-26 DIAGNOSIS — Z91018 Allergy to other foods: Secondary | ICD-10-CM

## 2020-04-26 DIAGNOSIS — E669 Obesity, unspecified: Secondary | ICD-10-CM | POA: Diagnosis present

## 2020-04-26 DIAGNOSIS — Z8249 Family history of ischemic heart disease and other diseases of the circulatory system: Secondary | ICD-10-CM

## 2020-04-26 DIAGNOSIS — Z79891 Long term (current) use of opiate analgesic: Secondary | ICD-10-CM

## 2020-04-26 DIAGNOSIS — Z79899 Other long term (current) drug therapy: Secondary | ICD-10-CM

## 2020-04-26 DIAGNOSIS — E291 Testicular hypofunction: Secondary | ICD-10-CM | POA: Diagnosis present

## 2020-04-26 DIAGNOSIS — Z818 Family history of other mental and behavioral disorders: Secondary | ICD-10-CM

## 2020-04-26 DIAGNOSIS — Z83438 Family history of other disorder of lipoprotein metabolism and other lipidemia: Secondary | ICD-10-CM

## 2020-04-26 DIAGNOSIS — Z20822 Contact with and (suspected) exposure to covid-19: Secondary | ICD-10-CM | POA: Diagnosis present

## 2020-04-26 DIAGNOSIS — Z79890 Hormone replacement therapy (postmenopausal): Secondary | ICD-10-CM

## 2020-04-26 DIAGNOSIS — E782 Mixed hyperlipidemia: Secondary | ICD-10-CM | POA: Diagnosis present

## 2020-04-26 DIAGNOSIS — Z8261 Family history of arthritis: Secondary | ICD-10-CM

## 2020-04-26 LAB — I-STAT CHEM 8, ED
BUN: 22 mg/dL — ABNORMAL HIGH (ref 6–20)
Calcium, Ion: 1.18 mmol/L (ref 1.15–1.40)
Chloride: 102 mmol/L (ref 98–111)
Creatinine, Ser: 1 mg/dL (ref 0.61–1.24)
Glucose, Bld: 129 mg/dL — ABNORMAL HIGH (ref 70–99)
HCT: 38 % — ABNORMAL LOW (ref 39.0–52.0)
Hemoglobin: 12.9 g/dL — ABNORMAL LOW (ref 13.0–17.0)
Potassium: 4.3 mmol/L (ref 3.5–5.1)
Sodium: 139 mmol/L (ref 135–145)
TCO2: 26 mmol/L (ref 22–32)

## 2020-04-26 LAB — COMPREHENSIVE METABOLIC PANEL
ALT: 24 U/L (ref 0–44)
AST: 20 U/L (ref 15–41)
Albumin: 3.9 g/dL (ref 3.5–5.0)
Alkaline Phosphatase: 50 U/L (ref 38–126)
Anion gap: 12 (ref 5–15)
BUN: 19 mg/dL (ref 6–20)
CO2: 23 mmol/L (ref 22–32)
Calcium: 9.3 mg/dL (ref 8.9–10.3)
Chloride: 102 mmol/L (ref 98–111)
Creatinine, Ser: 1.02 mg/dL (ref 0.61–1.24)
GFR calc Af Amer: >60 mL/min (ref >60)
GFR calc non Af Amer: >60 mL/min (ref >60)
Glucose, Bld: 133 mg/dL — ABNORMAL HIGH (ref 70–99)
Potassium: 4.4 mmol/L (ref 3.5–5.1)
Sodium: 137 mmol/L (ref 135–145)
Total Bilirubin: 0.7 mg/dL (ref 0.3–1.2)
Total Protein: 7 g/dL (ref 6.5–8.1)

## 2020-04-26 LAB — DIFFERENTIAL
Abs Immature Granulocytes: 0.02 10*3/uL (ref 0.00–0.07)
Basophils Absolute: 0.1 10*3/uL (ref 0.0–0.1)
Basophils Relative: 1 %
Eosinophils Absolute: 0 10*3/uL (ref 0.0–0.5)
Eosinophils Relative: 0 %
Immature Granulocytes: 0 %
Lymphocytes Relative: 12 %
Lymphs Abs: 0.7 10*3/uL (ref 0.7–4.0)
Monocytes Absolute: 0.4 10*3/uL (ref 0.1–1.0)
Monocytes Relative: 8 %
Neutro Abs: 4.4 10*3/uL (ref 1.7–7.7)
Neutrophils Relative %: 79 %

## 2020-04-26 LAB — PROTIME-INR
INR: 1 (ref 0.8–1.2)
Prothrombin Time: 13.2 seconds (ref 11.4–15.2)

## 2020-04-26 LAB — CBC
HCT: 38.8 % — ABNORMAL LOW (ref 39.0–52.0)
Hemoglobin: 12.3 g/dL — ABNORMAL LOW (ref 13.0–17.0)
MCH: 28.3 pg (ref 26.0–34.0)
MCHC: 31.7 g/dL (ref 30.0–36.0)
MCV: 89.2 fL (ref 80.0–100.0)
Platelets: 289 10*3/uL (ref 150–400)
RBC: 4.35 MIL/uL (ref 4.22–5.81)
RDW: 13.8 % (ref 11.5–15.5)
WBC: 5.6 10*3/uL (ref 4.0–10.5)
nRBC: 0 % (ref 0.0–0.2)

## 2020-04-26 LAB — APTT: aPTT: 28 seconds (ref 24–36)

## 2020-04-26 MED ORDER — MECLIZINE HCL 25 MG PO TABS
50.0000 mg | ORAL_TABLET | Freq: Once | ORAL | Status: AC
  Administered 2020-04-26: 50 mg via ORAL
  Filled 2020-04-26: qty 2

## 2020-04-26 MED ORDER — BACLOFEN 20 MG PO TABS
20.0000 mg | ORAL_TABLET | Freq: Once | ORAL | Status: AC
  Administered 2020-04-26: 20 mg via ORAL
  Filled 2020-04-26: qty 1
  Filled 2020-04-26: qty 2

## 2020-04-26 MED ORDER — VANCOMYCIN HCL 1750 MG/350ML IV SOLN: 1750.0000 mg | Freq: Once | INTRAVENOUS | Status: DC

## 2020-04-26 MED ORDER — ONDANSETRON HCL 4 MG/2ML IJ SOLN
4.0000 mg | Freq: Once | INTRAMUSCULAR | Status: AC
  Administered 2020-04-26: 4 mg via INTRAVENOUS
  Filled 2020-04-26: qty 2

## 2020-04-26 MED ORDER — SODIUM CHLORIDE 0.9% FLUSH
3.0000 mL | Freq: Once | INTRAVENOUS | Status: DC
Start: 2020-04-26 — End: 2020-04-28

## 2020-04-26 MED ORDER — SODIUM CHLORIDE 0.9 % IV BOLUS
1000.0000 mL | Freq: Once | INTRAVENOUS | Status: AC
  Administered 2020-04-26: 1000 mL via INTRAVENOUS

## 2020-04-26 MED ORDER — VANCOMYCIN HCL 1750 MG/350ML IV SOLN
1750.0000 mg | INTRAVENOUS | Status: DC
  Administered 2020-04-26: 1750 mg via INTRAVENOUS
  Filled 2020-04-26: qty 350

## 2020-04-26 MED ORDER — SODIUM CHLORIDE 0.9 % IV SOLN: 1.0000 g | Freq: Once | INTRAVENOUS | Status: DC

## 2020-04-26 MED ORDER — VANCOMYCIN HCL 10 G IV SOLR
2500.0000 mg | Freq: Once | INTRAVENOUS | Status: DC
  Filled 2020-04-26: qty 2500

## 2020-04-26 MED ORDER — SODIUM CHLORIDE 0.9 % IV SOLN
2.0000 g | Freq: Once | INTRAVENOUS | Status: AC
  Administered 2020-04-26: 2 g via INTRAVENOUS
  Filled 2020-04-26: qty 20

## 2020-04-26 MED ORDER — OXYCODONE HCL 5 MG PO TABS
10.0000 mg | ORAL_TABLET | Freq: Once | ORAL | Status: AC
  Administered 2020-04-26: 10 mg via ORAL
  Filled 2020-04-26: qty 2

## 2020-04-26 MED ORDER — DIAZEPAM 5 MG/ML IJ SOLN
2.5000 mg | Freq: Once | INTRAMUSCULAR | Status: AC
  Administered 2020-04-26: 2.5 mg via INTRAVENOUS
  Filled 2020-04-26: qty 2

## 2020-04-26 NOTE — ED Provider Notes (Signed)
[facility] Provider [facility]   CSN: [id number] Arrival date & time: 04/26/20  1145     History Chief Complaint  Patient presents with  . Dizziness    Randy FaceJohn S [name] is a 59 y.o. male.  HPI      59yo male with history of hypertension, hyperlipidemia, thoracic discitis on IV abx via PICC line, hypothyroidism, presents with concern for vertigo.  Woke up with dizziness, room spinning this morning Severe room spinning sensation, worse with moving head, standing, better with laying with head still Room spinning brought on by change in position Bright lights, loud noises make it worse No history or same Has not had home vancomycin/pain medications today No visual changes  Nausea and vomiting Denies numbness, weakness, difficulty talking or walking, visual changes or facial droop.  No headaches    Past Medical History:  Diagnosis Date  . At risk for sleep apnea    STOP-BANG= 6         SENT TO PCP 06-24-2016  . Back pain   . History of thyroid nodule    s/p  right thyroid lopectomy--  per path report:  follicular adenoma, chronic thyroiditis  . Hyperlipidemia   . Hypertension   . Hypogonadism male   . Hypothyroidism   . Obesity   . Other specified disorders of tendon, right knee    quad tendon tear  . SOB (shortness of breath)   . Vitamin D deficiency   . Wears glasses     Patient Active Problem List   Diagnosis Date Noted  . PICC (peripherally inserted central catheter) in place 04/22/2020  . Medication monitoring encounter 04/22/2020  . Thoracic discitis 03/04/2020  . Low back pain 10/24/2019  . Thoracic spine pain 05/16/2019  . Foot pain, left 05/16/2019  . Grief 05/16/2019  . Shoulder pain 03/30/2018  . Flu-like symptoms 10/19/2017  . S/P knee surgery 06/28/2016  . Pain of right heel 01/01/2013  . RUQ abdominal pain 06/19/2012  . Well adult exam 01/27/2012  . Hypogonadism in male 08/24/2009  . 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 COLONIC POLYPS, HX OF 04/29/2009  . Hypothyroidism 06/24/2007  . Dyslipidemia 06/24/2007  . Essential hypertension 06/24/2007  . OBESITY, MORBID 06/22/2007    Past Surgical History:  Procedure Laterality Date  . COLONOSCOPY  2006  . EYE SURGERY Right age 59   removal glass  . HYPOSPADIAS CORRECTION  age 89  . IR CERVICAL/THORACIC DISC ASPIRATION W/IMAG GUIDE  03/31/2020      . IR CERVICAL/THORACIC DISC ASPIRATION W/IMAG GUIDE  03/31/2020  . QUADRICEPS TENDON REPAIR Left 06/28/2016   Procedure: LEFT KNEE REPAIR QUADRICEP TENDON;  Surgeon: Eugenia Mcalpineobert Collins, MD;  [facility];  Service: Orthopedics;  Laterality: Left;  . THYROID LOBECTOMY Right 02/03/2005       Family History  Problem Relation Age of Onset  . Mental illness Mother        dementia  . Hypertension Mother   . Hyperlipidemia Mother   . Heart disease Mother   . Thyroid disease Mother   . Depression Mother   . Anxiety disorder Mother   . Schizophrenia Mother   . Arthritis Father   . COPD Father   . Hypertension Father   . Hyperlipidemia Father   . Heart disease Father   . Thyroid disease Father   . Sleep apnea Father   . Alcohol abuse Father   . Liver disease Father  Social History   Tobacco Use  . Smoking status: Never Smoker  . Smokeless tobacco: Never Used  Vaping Use  . Vaping Use: Never used  Substance Use Topics  . Alcohol use: Not Currently    Alcohol/week: 1.0 standard drink    Types: 1 Cans of beer per week    Comment: occasional  . Drug use: No    Home Medications Prior to Admission medications   Medication Sig Start Date End Date Taking? Authorizing Provider  acetaminophen-codeine (TYLENOL #3) 300-30 MG tablet Take 1 tablet by mouth 4 (four) times daily as needed for severe pain.  02/29/20   [provider]  amLODipine (NORVASC) 5 MG tablet Take 1 tablet (5 mg total) by mouth daily. 03/25/20   Plotnikov, Georgina Quint, MD  Ascorbic Acid  (VITAMIN C WITH ROSE HIPS) 1000 MG tablet Take 5,000 mg by mouth daily.    [provider]  aspirin EC 81 MG tablet Take 81 mg by mouth daily. Swallow whole.    [provider]  baclofen (LIORESAL) 20 MG tablet Take 20 mg by mouth 3 (three) times daily as needed for muscle spasms.  02/26/20   [provider]  benazepril (LOTENSIN) 20 MG tablet Take 1 tablet (20 mg total) by mouth daily. 03/27/20   Plotnikov, Georgina Quint, MD  cefTRIAXone (ROCEPHIN) IVPB Inject into the vein.    [provider]  Cholecalciferol (VITAMIN D3) 50 MCG (2000 UT) TABS Take 2,000 Units by mouth daily.     [provider]  Coenzyme Q10 (COQ10) 100 MG CAPS Take 1 capsule by mouth every morning.    [provider]  Cyanocobalamin (B-12) 2000 MCG TABS Take 1 tablet by mouth every morning.    [provider]  ibuprofen (ADVIL) 200 MG tablet Take 800 mg by mouth 3 (three) times daily.    [provider]  levothyroxine (SYNTHROID) 125 MCG tablet TAKE TWO TABLETS BY MOUTH ONCE DAILY BEFORE BREAKFAST. PT NEEDS OV FOR FUTURE REFILLS 03/25/20   Plotnikov, Georgina Quint, MD  lovastatin (MEVACOR) 40 MG tablet Take 1 tablet (40 mg total) by mouth at bedtime. Pt need appt for future refills 03/25/20   Plotnikov, Georgina Quint, MD  meloxicam (MOBIC) 15 MG tablet Take 1 tablet (15 mg total) by mouth daily. Patient not taking: Reported on 04/22/2020 03/25/20   Plotnikov, Georgina Quint, MD  Multiple Vitamin (MULTIVITAMIN) tablet Take 1 tablet by mouth every morning.    [provider]  oxyCODONE-acetaminophen (PERCOCET) 10-325 MG tablet Take 1 tablet by mouth at bedtime.  02/25/20   [provider]  tiZANidine (ZANAFLEX) 4 MG tablet Take 1 tablet (4 mg total) by mouth every 6 (six) hours as needed for muscle spasms. Patient not taking: Reported on 03/04/2020 11/06/19   Corwin Levins, MD  triamterene-hydrochlorothiazide (MAXZIDE-25) 37.5-25 MG tablet TAKE 1 TABLET BY MOUTH ONCE  DAILY. PT NEEDS APPT FOR FUTURE REFILLS 03/25/20   Plotnikov, Georgina Quint, MD  Turmeric 500 MG TABS Take 1 capsule by mouth every morning.    [provider]  vancomycin IVPB Inject into the vein.    [provider]    Allergies    Penicillins and Watermelon [citrullus vulgaris]  Review of Systems   Review of Systems  Constitutional: Negative for fever.  HENT: Negative for sore throat.   Eyes: Negative for visual disturbance.  Respiratory: Negative for shortness of breath.   Cardiovascular: Negative for chest pain.  Gastrointestinal: Positive for nausea and vomiting.  Negative for abdominal pain.  Genitourinary: Negative for difficulty urinating.  Musculoskeletal: Negative for back pain and neck stiffness.  Skin: Negative for rash.  Neurological: Positive for dizziness. Negative for syncope, facial asymmetry, speech difficulty, weakness, numbness and headaches.    Physical Exam Updated Vital Signs BP (!) 145/82   Pulse 90   Temp 98.1 F (36.7 C) (Oral)   Resp (!) 23   Ht 6' (1.829 m)   Wt (!) 147 kg   SpO2 96%   BMI 43.94 kg/m   Physical Exam Vitals and nursing note reviewed.  Constitutional:      General: He is not in acute distress.    Appearance: Normal appearance. He is well-developed. He is not ill-appearing or diaphoretic.  HENT:     Head: Normocephalic and atraumatic.  Eyes:     General: No visual field deficit.    Extraocular Movements: Extraocular movements intact.     Conjunctiva/sclera: Conjunctivae normal.     Pupils: Pupils are equal, round, and reactive to light.  Cardiovascular:     Rate and Rhythm: Normal rate and regular rhythm.     Pulses: Normal pulses.     Heart sounds: Normal heart sounds. No murmur heard.  No friction rub. No gallop.   Pulmonary:     Effort: Pulmonary effort is normal. No respiratory distress.     Breath sounds: Normal breath sounds. No wheezing or rales.  Abdominal:     General: There is no distension.      Palpations: Abdomen is soft.     Tenderness: There is no abdominal tenderness. There is no guarding.  Musculoskeletal:        General: No swelling or tenderness.     Cervical back: Normal range of motion.  Skin:    General: Skin is warm and dry.     Findings: No erythema or rash.  Neurological:     General: No focal deficit present.     Mental Status: He is alert and oriented to person, place, and time.     GCS: GCS eye subscore is 4. GCS verbal subscore is 5. GCS motor subscore is 6.     Cranial Nerves: No cranial nerve deficit, dysarthria or facial asymmetry.     Sensory: No sensory deficit.     Motor: No weakness or tremor.     Coordination: Coordination normal. Finger-Nose-Finger Test normal.     Gait: Gait abnormal.     ED Results / Procedures / Treatments   Labs (all labs ordered are listed, but only abnormal results are displayed) Labs Reviewed  CBC - Abnormal; Notable for the following components:      Result Value   Hemoglobin 12.3 (*)    HCT 38.8 (*)    All other components within normal limits  COMPREHENSIVE METABOLIC PANEL - Abnormal; Notable for the following components:   Glucose, Bld 133 (*)    All other components within normal limits  I-STAT CHEM 8, ED - Abnormal; Notable for the following components:   BUN 22 (*)    Glucose, Bld 129 (*)    Hemoglobin 12.9 (*)    HCT 38.0 (*)    All other components within normal limits  PROTIME-INR  APTT  DIFFERENTIAL  CBG MONITORING, ED    EKG EKG Interpretation  Date/Time:  Sunday April 26 2020 12:51:48 EDT Ventricular Rate:  72 PR Interval:  158 QRS Duration: 104 QT Interval:  404 QTC Calculation: 442 R Axis:   8 Text Interpretation: Normal  sinus rhythm Minimal voltage criteria for LVH, may be normal variant ( R in aVL ) Cannot rule out Anterior infarct , age undetermined Abnormal ECG No significant change since last tracing Confirmed by Alvira Monday ([id number]) on 04/26/2020 5:20:42 PM   Radiology CT  HEAD WO CONTRAST  Result Date: 04/26/2020 CLINICAL DATA:  Dizziness with nausea and vomiting beginning this morning. EXAM: CT HEAD WITHOUT CONTRAST TECHNIQUE: Contiguous axial images were obtained from the base of the skull through the vertex without intravenous contrast. COMPARISON:  None. FINDINGS: Brain: Ventricles, cisterns and other CSF spaces are normal. There is no mass, mass effect, shift of midline structures or acute hemorrhage. No evidence of acute infarction. Vascular: No hyperdense vessel or unexpected calcification. Skull: Normal. Negative for fracture or focal lesion. Sinuses/Orbits: Minimal opacification over the left frontal sinus and anterior ethmoid air cells. Paranasal sinuses are otherwise unremarkable. Orbits are normal. Other: None. IMPRESSION: 1. No acute findings. 2. Minimal chronic sinus inflammatory change. Electronically Signed   By: Elberta Fortis M.D.   On: 04/26/2020 16:45    Procedures Procedures (including critical care time)  Medications Ordered in ED Medications  sodium chloride flush (NS) 0.9 % injection 3 mL (3 mLs Intravenous Not Given 04/26/20 1755)  baclofen (LIORESAL) tablet 20 mg (has no administration in time range)  oxyCODONE (Oxy IR/ROXICODONE) immediate release tablet 10 mg (has no administration in time range)  cefTRIAXone (ROCEPHIN) 2 g in sodium chloride 0.9 % 100 mL IVPB (has no administration in time range)  vancomycin (VANCOREADY) IVPB 1750 mg/350 mL (has no administration in time range)  sodium chloride 0.9 % bolus 1,000 mL (1,000 mLs Intravenous New Bag/Given 04/26/20 1900)  meclizine (ANTIVERT) tablet 50 mg (50 mg Oral Given 04/26/20 1901)  ondansetron (ZOFRAN) injection 4 mg (4 mg Intravenous Given 04/26/20 1901)    ED Course  I have reviewed the triage vital signs and the nursing notes.  Pertinent labs & imaging results that were available during my care of the patient were reviewed by me and considered in my medical decision making (see  chart for details).    MDM Rules/Calculators/A&P                          59yo male with history of hypertension, hyperlipidemia, thoracic discitis on IV abx via PICC line, hypothyroidism, presents with concern for vertigo.  Differential diagnosis for dizziness includes central causes such as stroke, intracranial bleed, mass and peripheral causes such as BPPV, meniere's disease, viral.  Initial history most consistent with peripheral vertigo, however continuing to have symptoms after meclizine and difficulty ambulating and will proceed with MRI for evaluation for posterior circulation CVA.   MRI shows no sign of abnormalities.   Given valium and continues to be unable to walk. He lives alone up two flights of stairs.  Will admit for continued care of peripheral vertigo and inability to ambulate.    Final Clinical Impression(s) / ED Diagnoses Final diagnoses:  Vertigo    Rx / DC Orders ED Discharge Orders    None       Alvira Monday, MD 04/27/20 0126

## 2020-04-26 NOTE — ED Notes (Signed)
RN and NT attempted to ambulate pt. Pt was able to stand with 1 person assist. When trying to walk, pt stumbled into NT. Pt did not fall, but was very of balance. Pt returned to the bed safely. MD notified.

## 2020-04-26 NOTE — ED Notes (Signed)
Patient transported to MRI 

## 2020-04-26 NOTE — ED Triage Notes (Signed)
Patient states he woke this morning and felt like the room was spinning. Patient has a PICC line in left arm for antibiotics treating an infection in his spine. Denies any other complaints, no vision deficits, grip strength equal, patient alert and oriented.

## 2020-04-26 NOTE — ED Notes (Signed)
Pt called 3 times for CT no answer

## 2020-04-27 ENCOUNTER — Encounter (HOSPITAL_COMMUNITY): Payer: Self-pay | Admitting: Internal Medicine

## 2020-04-27 DIAGNOSIS — Z8261 Family history of arthritis: Secondary | ICD-10-CM | POA: Diagnosis not present

## 2020-04-27 DIAGNOSIS — Z791 Long term (current) use of non-steroidal anti-inflammatories (NSAID): Secondary | ICD-10-CM | POA: Diagnosis not present

## 2020-04-27 DIAGNOSIS — E039 Hypothyroidism, unspecified: Secondary | ICD-10-CM | POA: Diagnosis present

## 2020-04-27 DIAGNOSIS — H8110 Benign paroxysmal vertigo, unspecified ear: Secondary | ICD-10-CM | POA: Diagnosis present

## 2020-04-27 DIAGNOSIS — H8112 Benign paroxysmal vertigo, left ear: Secondary | ICD-10-CM | POA: Diagnosis not present

## 2020-04-27 DIAGNOSIS — Z880 Allergy status to penicillin: Secondary | ICD-10-CM | POA: Diagnosis not present

## 2020-04-27 DIAGNOSIS — M4644 Discitis, unspecified, thoracic region: Secondary | ICD-10-CM | POA: Diagnosis present

## 2020-04-27 DIAGNOSIS — Z8249 Family history of ischemic heart disease and other diseases of the circulatory system: Secondary | ICD-10-CM | POA: Diagnosis not present

## 2020-04-27 DIAGNOSIS — E559 Vitamin D deficiency, unspecified: Secondary | ICD-10-CM | POA: Diagnosis present

## 2020-04-27 DIAGNOSIS — I10 Essential (primary) hypertension: Secondary | ICD-10-CM | POA: Diagnosis present

## 2020-04-27 DIAGNOSIS — Z20822 Contact with and (suspected) exposure to covid-19: Secondary | ICD-10-CM | POA: Diagnosis present

## 2020-04-27 DIAGNOSIS — E291 Testicular hypofunction: Secondary | ICD-10-CM | POA: Diagnosis present

## 2020-04-27 DIAGNOSIS — Z811 Family history of alcohol abuse and dependence: Secondary | ICD-10-CM | POA: Diagnosis not present

## 2020-04-27 DIAGNOSIS — Z818 Family history of other mental and behavioral disorders: Secondary | ICD-10-CM | POA: Diagnosis not present

## 2020-04-27 DIAGNOSIS — R42 Dizziness and giddiness: Secondary | ICD-10-CM | POA: Diagnosis present

## 2020-04-27 DIAGNOSIS — E782 Mixed hyperlipidemia: Secondary | ICD-10-CM | POA: Diagnosis present

## 2020-04-27 DIAGNOSIS — Z825 Family history of asthma and other chronic lower respiratory diseases: Secondary | ICD-10-CM | POA: Diagnosis not present

## 2020-04-27 DIAGNOSIS — Z79899 Other long term (current) drug therapy: Secondary | ICD-10-CM | POA: Diagnosis not present

## 2020-04-27 DIAGNOSIS — Z83438 Family history of other disorder of lipoprotein metabolism and other lipidemia: Secondary | ICD-10-CM | POA: Diagnosis not present

## 2020-04-27 DIAGNOSIS — E669 Obesity, unspecified: Secondary | ICD-10-CM | POA: Diagnosis present

## 2020-04-27 DIAGNOSIS — Z79891 Long term (current) use of opiate analgesic: Secondary | ICD-10-CM | POA: Diagnosis not present

## 2020-04-27 DIAGNOSIS — Z79890 Hormone replacement therapy (postmenopausal): Secondary | ICD-10-CM | POA: Diagnosis not present

## 2020-04-27 DIAGNOSIS — Z8349 Family history of other endocrine, nutritional and metabolic diseases: Secondary | ICD-10-CM | POA: Diagnosis not present

## 2020-04-27 DIAGNOSIS — Z91018 Allergy to other foods: Secondary | ICD-10-CM | POA: Diagnosis not present

## 2020-04-27 DIAGNOSIS — Z7982 Long term (current) use of aspirin: Secondary | ICD-10-CM | POA: Diagnosis not present

## 2020-04-27 LAB — CBC WITH DIFFERENTIAL/PLATELET
Abs Immature Granulocytes: 0.01 10*3/uL (ref 0.00–0.07)
Basophils Absolute: 0.1 10*3/uL (ref 0.0–0.1)
Basophils Relative: 1 %
Eosinophils Absolute: 0.1 10*3/uL (ref 0.0–0.5)
Eosinophils Relative: 2 %
HCT: 33.1 % — ABNORMAL LOW (ref 39.0–52.0)
Hemoglobin: 10.5 g/dL — ABNORMAL LOW (ref 13.0–17.0)
Immature Granulocytes: 0 %
Lymphocytes Relative: 24 %
Lymphs Abs: 1.3 10*3/uL (ref 0.7–4.0)
MCH: 28.5 pg (ref 26.0–34.0)
MCHC: 31.7 g/dL (ref 30.0–36.0)
MCV: 89.9 fL (ref 80.0–100.0)
Monocytes Absolute: 0.7 10*3/uL (ref 0.1–1.0)
Monocytes Relative: 13 %
Neutro Abs: 3.1 10*3/uL (ref 1.7–7.7)
Neutrophils Relative %: 60 %
Platelets: 248 10*3/uL (ref 150–400)
RBC: 3.68 MIL/uL — ABNORMAL LOW (ref 4.22–5.81)
RDW: 14.2 % (ref 11.5–15.5)
WBC: 5.2 10*3/uL (ref 4.0–10.5)
nRBC: 0 % (ref 0.0–0.2)

## 2020-04-27 LAB — COMPREHENSIVE METABOLIC PANEL
ALT: 20 U/L (ref 0–44)
AST: 15 U/L (ref 15–41)
Albumin: 3.2 g/dL — ABNORMAL LOW (ref 3.5–5.0)
Alkaline Phosphatase: 40 U/L (ref 38–126)
Anion gap: 10 (ref 5–15)
BUN: 14 mg/dL (ref 6–20)
CO2: 25 mmol/L (ref 22–32)
Calcium: 8.8 mg/dL — ABNORMAL LOW (ref 8.9–10.3)
Chloride: 103 mmol/L (ref 98–111)
Creatinine, Ser: 1.01 mg/dL (ref 0.61–1.24)
GFR calc Af Amer: >60 mL/min (ref >60)
GFR calc non Af Amer: >60 mL/min (ref >60)
Glucose, Bld: 98 mg/dL (ref 70–99)
Potassium: 3.5 mmol/L (ref 3.5–5.1)
Sodium: 138 mmol/L (ref 135–145)
Total Bilirubin: 0.6 mg/dL (ref 0.3–1.2)
Total Protein: 5.9 g/dL — ABNORMAL LOW (ref 6.5–8.1)

## 2020-04-27 LAB — MAGNESIUM: Magnesium: 2.1 mg/dL (ref 1.7–2.4)

## 2020-04-27 LAB — HIV ANTIBODY (ROUTINE TESTING W REFLEX): HIV Screen 4th Generation wRfx: NONREACTIVE

## 2020-04-27 LAB — SARS CORONAVIRUS 2 BY RT PCR (HOSPITAL ORDER, PERFORMED IN ~~LOC~~ HOSPITAL LAB): SARS Coronavirus 2: NEGATIVE

## 2020-04-27 LAB — TSH: TSH: 0.406 u[IU]/mL (ref 0.350–4.500)

## 2020-04-27 MED ORDER — ADULT MULTIVITAMIN W/MINERALS CH
1.0000 | ORAL_TABLET | Freq: Every morning | ORAL | Status: DC
  Administered 2020-04-27 – 2020-04-28 (×2): 1 via ORAL
  Filled 2020-04-27 (×2): qty 1

## 2020-04-27 MED ORDER — AMLODIPINE BESYLATE 5 MG PO TABS
5.0000 mg | ORAL_TABLET | Freq: Every day | ORAL | Status: DC
  Administered 2020-04-27 – 2020-04-28 (×2): 5 mg via ORAL
  Filled 2020-04-27 (×2): qty 1

## 2020-04-27 MED ORDER — OXYCODONE-ACETAMINOPHEN 10-325 MG PO TABS: 1.0000 | ORAL_TABLET | Freq: Every day | ORAL | Status: DC

## 2020-04-27 MED ORDER — CEFTRIAXONE IV (FOR PTA / DISCHARGE USE ONLY): 2.0000 g | INTRAVENOUS | Status: DC

## 2020-04-27 MED ORDER — BENAZEPRIL HCL 20 MG PO TABS
20.0000 mg | ORAL_TABLET | Freq: Every day | ORAL | Status: DC
  Administered 2020-04-27 – 2020-04-28 (×2): 20 mg via ORAL
  Filled 2020-04-27 (×2): qty 1

## 2020-04-27 MED ORDER — CHLORHEXIDINE GLUCONATE CLOTH 2 % EX PADS
6.0000 | MEDICATED_PAD | Freq: Every day | CUTANEOUS | Status: DC
  Administered 2020-04-28: 6 via TOPICAL

## 2020-04-27 MED ORDER — POLYETHYLENE GLYCOL 3350 17 G PO PACK: 17.0000 g | PACK | Freq: Every day | ORAL | Status: DC | PRN

## 2020-04-27 MED ORDER — PRAVASTATIN SODIUM 40 MG PO TABS
40.0000 mg | ORAL_TABLET | Freq: Every day | ORAL | Status: DC
  Administered 2020-04-27: 40 mg via ORAL
  Filled 2020-04-27: qty 1

## 2020-04-27 MED ORDER — ASPIRIN EC 81 MG PO TBEC
81.0000 mg | DELAYED_RELEASE_TABLET | Freq: Every day | ORAL | Status: DC
  Administered 2020-04-27 – 2020-04-28 (×2): 81 mg via ORAL
  Filled 2020-04-27 (×2): qty 1

## 2020-04-27 MED ORDER — ACETAMINOPHEN 650 MG RE SUPP: 650.0000 mg | Freq: Four times a day (QID) | RECTAL | Status: DC | PRN

## 2020-04-27 MED ORDER — SODIUM CHLORIDE 0.9% FLUSH
10.0000 mL | Freq: Two times a day (BID) | INTRAVENOUS | Status: DC
  Administered 2020-04-28: 10 mL

## 2020-04-27 MED ORDER — ENOXAPARIN SODIUM 40 MG/0.4ML ~~LOC~~ SOLN
40.0000 mg | Freq: Every day | SUBCUTANEOUS | Status: DC
  Administered 2020-04-27 – 2020-04-28 (×2): 40 mg via SUBCUTANEOUS
  Filled 2020-04-27 (×2): qty 0.4

## 2020-04-27 MED ORDER — ONDANSETRON HCL 4 MG PO TABS
4.0000 mg | ORAL_TABLET | Freq: Four times a day (QID) | ORAL | Status: DC | PRN
  Administered 2020-04-27: 4 mg via ORAL
  Filled 2020-04-27: qty 1

## 2020-04-27 MED ORDER — VANCOMYCIN IV (FOR PTA / DISCHARGE USE ONLY): 1750.0000 mg | Freq: Every day | INTRAVENOUS | Status: DC

## 2020-04-27 MED ORDER — SODIUM CHLORIDE 0.9 % IV SOLN
2.0000 g | INTRAVENOUS | Status: DC
  Administered 2020-04-27: 2 g via INTRAVENOUS
  Filled 2020-04-27 (×2): qty 20
  Filled 2020-04-27: qty 2

## 2020-04-27 MED ORDER — SODIUM CHLORIDE 0.9% FLUSH: 10.0000 mL | INTRAVENOUS | Status: DC | PRN

## 2020-04-27 MED ORDER — ONDANSETRON HCL 4 MG/2ML IJ SOLN: 4.0000 mg | Freq: Four times a day (QID) | INTRAMUSCULAR | Status: DC | PRN

## 2020-04-27 MED ORDER — OXYCODONE-ACETAMINOPHEN 5-325 MG PO TABS
1.0000 | ORAL_TABLET | Freq: Every day | ORAL | Status: DC
  Administered 2020-04-27 (×2): 1 via ORAL
  Filled 2020-04-27 (×2): qty 1

## 2020-04-27 MED ORDER — LEVOTHYROXINE SODIUM 50 MCG PO TABS
250.0000 ug | ORAL_TABLET | Freq: Every day | ORAL | Status: DC
  Administered 2020-04-27 – 2020-04-28 (×2): 250 ug via ORAL
  Filled 2020-04-27 (×2): qty 2

## 2020-04-27 MED ORDER — MECLIZINE HCL 25 MG PO TABS
25.0000 mg | ORAL_TABLET | Freq: Three times a day (TID) | ORAL | Status: DC | PRN
  Administered 2020-04-27: 25 mg via ORAL
  Filled 2020-04-27 (×2): qty 1

## 2020-04-27 MED ORDER — ACETAMINOPHEN-CODEINE #3 300-30 MG PO TABS: 1.0000 | ORAL_TABLET | Freq: Four times a day (QID) | ORAL | Status: DC | PRN

## 2020-04-27 MED ORDER — IBUPROFEN 800 MG PO TABS
800.0000 mg | ORAL_TABLET | Freq: Every day | ORAL | Status: DC
  Administered 2020-04-27 – 2020-04-28 (×2): 800 mg via ORAL
  Filled 2020-04-27 (×2): qty 1

## 2020-04-27 MED ORDER — VANCOMYCIN HCL 1750 MG/350ML IV SOLN
1750.0000 mg | INTRAVENOUS | Status: DC
  Administered 2020-04-28: 1750 mg via INTRAVENOUS
  Filled 2020-04-27 (×2): qty 350

## 2020-04-27 MED ORDER — HYDRALAZINE HCL 20 MG/ML IJ SOLN: 10.0000 mg | Freq: Four times a day (QID) | INTRAMUSCULAR | Status: DC | PRN

## 2020-04-27 MED ORDER — TRIAMTERENE-HCTZ 37.5-25 MG PO TABS
1.0000 | ORAL_TABLET | Freq: Every day | ORAL | Status: DC
  Administered 2020-04-27 – 2020-04-28 (×2): 1 via ORAL
  Filled 2020-04-27 (×2): qty 1

## 2020-04-27 MED ORDER — ACETAMINOPHEN 325 MG PO TABS: 650.0000 mg | ORAL_TABLET | Freq: Four times a day (QID) | ORAL | Status: DC | PRN

## 2020-04-27 MED ORDER — OXYCODONE HCL 5 MG PO TABS
5.0000 mg | ORAL_TABLET | Freq: Every day | ORAL | Status: DC
  Administered 2020-04-27 (×2): 5 mg via ORAL
  Filled 2020-04-27 (×2): qty 1

## 2020-04-27 MED ORDER — BACLOFEN 10 MG PO TABS: 20.0000 mg | ORAL_TABLET | Freq: Three times a day (TID) | ORAL | Status: DC | PRN

## 2020-04-27 NOTE — ED Notes (Signed)
Pt reports he is feeling better; drink given.

## 2020-04-27 NOTE — Evaluation (Signed)
Physical Therapy Evaluation Patient Details Name: Randy [name] MRN: [id number] DOB: 12/02/1960 Today's Date: 04/27/2020   History of Present Illness  59 year old male with past medical history of hypertension, hyperlipidemia, hypothyroidism and recent diagnosis of T8/T9 discitis currently receiving intravenous antibiotics via PICC line admitted with complaints of severe vertigo.  MRI negative for acute process.  Clinical Impression  Patient presents with symptoms indicative of R vestibular hypofunction.  He did not have positive head impulse test, but does have L beat nystagmus at times looking to L and subjective report sounds similar to hypofunction.  He also has decreased balance and activity tolerance limiting independence and safety with mobility.  He lives alone on third floor apartment so will need to practice steps prior to d/c home, but unable to tolerate today.  Feel he will benefit from skilled PT in the acute setting and follow up outpatient PT for vestibular rehab.  Note orthostatics below, pt negative for symptoms positionally and with negative modified hall pike.   Orthostatic VS for the past 24 hrs (Last 3 readings):  BP- Lying Pulse- Lying BP- Sitting Pulse- Sitting BP- Standing at 0 minutes Pulse- Standing at 0 minutes  04/27/20 1601 143/61 79 152/71 86 173/88 98      Follow Up Recommendations Outpatient PT (for vestibular rehab)    Equipment Recommendations  Rolling walker with 5" wheels    Recommendations for Other Services       Precautions / Restrictions Precautions Precautions: Fall Restrictions Weight Bearing Restrictions: No      Mobility  Bed Mobility Overal bed mobility: Modified Independent             General bed mobility comments: up to EOB and to supine on his own, some assist for sidelying test for BPPV due to spinal issues  Transfers Overall transfer level: Needs assistance Equipment used: Rolling walker (2 wheeled);None Transfers: Sit  to/from Stand Sit to Stand: Supervision;Min guard         General transfer comment: up to stand for orthostatics with S to minguard A for safety/balance, stood without device initially, then used RW for ambulation  Ambulation/Gait Ambulation/Gait assistance: Supervision;Min guard Gait Distance (Feet): 90 Feet Assistive device: Rolling walker (2 wheeled) Gait Pattern/deviations: Step-through pattern;Decreased stride length     General Gait Details: walked into hallway, initially no device in the room, but pt unsteady so used RW, in hallway c/o feeling queasy so returned to room  Stairs            Wheelchair Mobility    Modified Rankin (Stroke Patients Only)       Balance Overall balance assessment: Needs assistance   Sitting balance-Leahy Scale: Good     Standing balance support: No upper extremity supported Standing balance-Leahy Scale: Fair Standing balance comment: stood static for orthostatic testing                             Pertinent Vitals/Pain Pain Assessment: 0-10 Pain Score: 2  Pain Location: mid to upper back Pain Descriptors / Indicators: Aching Pain Intervention(s): Monitored during session    Home Living Family/patient expects to be discharged to:: Private residence Living Arrangements: Alone Available Help at Discharge: Friend(s);Available PRN/intermittently (ex wife)   Home Access: Stairs to enter Entrance Stairs-Rails: Doctor, general practice of Steps: 2 flights 16 stps each Home Layout: One level Home Equipment: None      Prior Function Level of Independence: Independent  Comments: works Pharmacologist, Geneticist, molecular   Dominant Hand: Right    Extremity/Trunk Assessment   Upper Extremity Assessment Upper Extremity Assessment: Overall WFL for tasks assessed    Lower Extremity Assessment Lower Extremity Assessment: Overall WFL for tasks assessed         Communication   Communication: No difficulties  Cognition Arousal/Alertness: Awake/alert Behavior During Therapy: WFL for tasks assessed/performed Overall Cognitive Status: Within Functional Limits for tasks assessed                                        General Comments General comments (skin integrity, edema, etc.): Educated in possible causes of symptoms, issued handout for vestibular adaptation exercise and for outpatient referral   Vestibular Assessment - 04/27/20 0001      Symptom Behavior   Subjective history of current problem Reports waking up early Sunday morning unable to get out of bed due to severe spinning, Called EMS as unable to leave his third floor apartment.    Type of Dizziness  Spinning    Frequency of Dizziness now only when looking to L when in bed    Duration of Dizziness minutes    Symptom Nature Intermittent;Spontaneous    Aggravating Factors Turning head quickly;Activity in general    Relieving Factors Head stationary    Progression of Symptoms Better    History of similar episodes none      Oculomotor Exam   Oculomotor Alignment Abnormal   R eye lower in orbit than L   Ocular ROM WNL    Spontaneous Absent    Gaze-induced  Left beating nystagmus with L gaze   only after head shake test   Head shaking Horizontal L beating nystagmus   only when eyes to L   Smooth Pursuits Intact    Saccades Intact      Oculomotor Exam-Fixation Suppressed    Left Head Impulse negative    Right Head Impulse negative      Vestibulo-Ocular Reflex   VOR 1 Head Only (x 1 viewing) performed with vertical and horizontal head movements with near target x 10 head turns, symptoms provoked up to 2-3/10      Auditory   Comments intact to scratch test and equal bilateral      Positional Testing   Sidelying Test Sidelying Right;Sidelying Left (P)             Exercises Other Exercises Other Exercises: seated vestibular adaptation/gaze stabilization wtih  horizontal then vertical head turns x 30 sec each at EOB with near target   Assessment/Plan    PT Assessment Patient needs continued PT services  PT Problem List Decreased balance;Decreased knowledge of use of DME;Decreased knowledge of precautions;Decreased mobility;Other (comment) (dizziness)       PT Treatment Interventions DME instruction;Therapeutic activities;Therapeutic exercise;Patient/family education;Stair training;Balance training;Functional mobility training;Neuromuscular re-education;Other (comment) (vestibular rehab)    PT Goals (Current goals can be found in the Care Plan section)  Acute Rehab PT Goals Patient Stated Goal: to go home PT Goal Formulation: With patient Time For Goal Achievement: 05/04/20 Potential to Achieve Goals: Good    Frequency Min 3X/week   Barriers to discharge        Co-evaluation               AM-PAC PT "6 Clicks" Mobility  Outcome Measure Help needed turning from your back  to your side while in a flat bed without using bedrails?: None Help needed moving from lying on your back to sitting on the side of a flat bed without using bedrails?: None Help needed moving to and from a bed to a chair (including a wheelchair)?: A Little Help needed standing up from a chair using your arms (e.g., wheelchair or bedside chair)?: A Little Help needed to walk in hospital room?: A Little Help needed climbing 3-5 steps with a railing? : A Little 6 Click Score: 20    End of Session   Activity Tolerance: Patient limited by fatigue (and intermittent nausea) Patient left: in bed;with call bell/phone within reach   PT Visit Diagnosis: Other abnormalities of gait and mobility (R26.89);Dizziness and giddiness (R42)    Time: 9528-4132 PT Time Calculation (min) (ACUTE ONLY): 52 min   Charges:   PT Evaluation $PT Eval Moderate Complexity: 1 Mod PT Treatments $Gait Training: 8-22 mins $Neuromuscular Re-education: 8-22 mins        Sheran Lawless,  PT Acute Rehabilitation Services Pager:[phone] Office:[phone] 04/27/2020   Randy [name] 04/27/2020, 6:27 PM

## 2020-04-27 NOTE — H&P (Signed)
History and Physical    Randy FaceJohn S Tocci ZOX:[id number] DOB: 01/02/1961 DOA: 04/26/2020  PCP: Tresa GarterPlotnikov, Aleksei V, MD  Patient coming from: Home   Chief Complaint:  Chief Complaint  Patient presents with  . Dizziness     HPI:   59 year old male with past medical history of hypertension, hyperlipidemia, hypothyroidism and recent diagnosis of T8/T9 discitis currently receiving intravenous antibiotics via PICC line who [facility] with complaints of severe vertigo.  Patient explains that the morning of 8/15, the patient woke from sleep and attempted to stand up, prompting sudden onset of severe vertigo.  This was associated with an inability to ambulate due to to his loss of balance from the vertigo.  Patient denies any associated focal weakness, slurring of speech or changes in vision.  Patient denies any facial droop.  Patient denies significant associated headache.  Upon further questioning, patient denies any drug use, alcohol use, fevers, recent travel, sick contacts or confirmed contact with COVID-19 infection.  Patient symptoms continue to persist throughout the day until he eventually presented to [facility] emergency department for evaluation.  Upon evaluation in the emergency department the patient was initially worked up for possible posterior circulation stroke.  CT imaging of the head as well as MRI of the brain revealed no evidence of acute CVA.  Attempts were made to ambulate the patient in preparation to be discharged home but due to the severity of his symptoms and the fact the patient lives alone and lives up 2 flights of stairs the emergency department staff were concerned as to the safety of the patient's disposition.  The hospitalist group was then called to assess the patient for mission to the hospital.   Review of Systems: Review of Systems  Gastrointestinal: Positive for nausea.  Neurological: Positive for dizziness.    All other systems reviewed and are negative.     Past Medical History:  Diagnosis Date  . At risk for sleep apnea    STOP-BANG= 6         SENT TO PCP 06-24-2016  . Back pain   . History of thyroid nodule    s/p  right thyroid lopectomy--  per path report:  follicular adenoma, chronic thyroiditis  . Hyperlipidemia   . Hypertension   . Hypogonadism male   . Hypothyroidism   . Obesity   . Other specified disorders of tendon, right knee    quad tendon tear  . SOB (shortness of breath)   . Vitamin D deficiency   . Wears glasses     Past Surgical History:  Procedure Laterality Date  . COLONOSCOPY  2006  . EYE SURGERY Right age [age]   removal glass  . HYPOSPADIAS CORRECTION  age 89  . IR CERVICAL/THORACIC DISC ASPIRATION W/IMAG GUIDE  03/31/2020      . IR CERVICAL/THORACIC DISC ASPIRATION W/IMAG GUIDE  03/31/2020  . QUADRICEPS TENDON REPAIR Left 06/28/2016   Procedure: LEFT KNEE REPAIR QUADRICEP TENDON;  Surgeon: Eugenia Mcalpineobert Collins, MD;  [facility];  Service: Orthopedics;  Laterality: Left;  . THYROID LOBECTOMY Right 02/03/2005     reports that he has never smoked. He has never used smokeless tobacco. He reports previous alcohol use of about 1.0 standard drink of alcohol per week. He reports that he does not use drugs.  Allergies  Allergen Reactions  . Penicillins     Family history of allergic reaction   . Watermelon [Citrullus Vulgaris]  Makes throat itch     Family History  Problem Relation Age of Onset  . Mental illness Mother        dementia  . Hypertension Mother   . Hyperlipidemia Mother   . Heart disease Mother   . Thyroid disease Mother   . Depression Mother   . Anxiety disorder Mother   . Schizophrenia Mother   . Arthritis Father   . COPD Father   . Hypertension Father   . Hyperlipidemia Father   . Heart disease Father   . Thyroid disease Father   . Sleep apnea Father   . Alcohol abuse Father   . Liver disease Father       Prior to Admission medications   Medication Sig Start Date End Date Taking? Authorizing Provider  acetaminophen-codeine (TYLENOL #3) 300-30 MG tablet Take 1 tablet by mouth 4 (four) times daily as needed for severe pain.  02/29/20  Yes [provider]  amLODipine (NORVASC) 5 MG tablet Take 1 tablet (5 mg total) by mouth daily. 03/25/20  Yes Plotnikov, Georgina Quint, MD  Ascorbic Acid (VITAMIN C WITH ROSE HIPS) 1000 MG tablet Take 5,000 mg by mouth daily.   Yes [provider]  aspirin EC 81 MG tablet Take 81 mg by mouth daily. Swallow whole.   Yes [provider]  baclofen (LIORESAL) 20 MG tablet Take 20 mg by mouth 3 (three) times daily as needed for muscle spasms.  02/26/20  Yes [provider]  benazepril (LOTENSIN) 20 MG tablet Take 1 tablet (20 mg total) by mouth daily. 03/27/20  Yes Plotnikov, Georgina Quint, MD  cefTRIAXone (ROCEPHIN) IVPB Inject 2 g into the vein daily. Infuse IV over 10 minutes every 24 hours   Yes [provider]  Cholecalciferol (VITAMIN D3) 50 MCG (2000 UT) TABS Take 2,000 Units by mouth daily.    Yes [provider]  Coenzyme Q10 (COQ10) 100 MG CAPS Take 1 capsule by mouth every morning.   Yes [provider]  Cyanocobalamin (B-12) 2000 MCG TABS Take 1 tablet by mouth every morning.   Yes [provider]  ibuprofen (ADVIL) 200 MG tablet Take 800 mg by mouth 3 (three) times daily.   Yes [provider]  levothyroxine (SYNTHROID) 125 MCG tablet TAKE TWO TABLETS BY MOUTH ONCE DAILY BEFORE BREAKFAST. PT NEEDS OV FOR FUTURE REFILLS Patient taking differently: Take 250 mcg by mouth daily before breakfast.  03/25/20  Yes Plotnikov, Georgina Quint, MD  lovastatin (MEVACOR) 40 MG tablet Take 1 tablet (40 mg total) by mouth at bedtime. Pt need appt for future refills 03/25/20  Yes Plotnikov, Georgina Quint, MD  Multiple Vitamin (MULTIVITAMIN) tablet Take 1 tablet by mouth every morning.   Yes [provider]   oxyCODONE-acetaminophen (PERCOCET) 10-325 MG tablet Take 1 tablet by mouth at bedtime.  02/25/20  Yes [provider]  triamterene-hydrochlorothiazide (MAXZIDE-25) 37.5-25 MG tablet TAKE 1 TABLET BY MOUTH ONCE DAILY. PT NEEDS APPT FOR FUTURE REFILLS Patient taking differently: Take 1 tablet by mouth daily.  03/25/20  Yes Plotnikov, Georgina Quint, MD  Turmeric 500 MG TABS Take 1 capsule by mouth every morning.   Yes [provider]  vancomycin IVPB Inject into the vein See admin instructions. 1750/274ml NS HP, Infuse IV over 85 minutes every 24 hours   Yes [provider]  meloxicam (MOBIC) 15 MG tablet Take 1 tablet (15 mg total) by mouth daily. Patient not taking: Reported on 04/27/2020 03/25/20   Plotnikov, [employer]  V, MD  tiZANidine (ZANAFLEX) 4 MG tablet Take 1 tablet (4 mg total) by mouth every 6 (six) hours as needed for muscle spasms. Patient not taking: Reported on 03/04/2020 11/06/19   Corwin Levins, MD    Physical Exam: Vitals:   04/26/20 2000 04/26/20 2235 04/26/20 2351 04/27/20 0058  BP: (!) 145/82 (!) 152/61 (!) 151/63 (!) 166/68  Pulse: 90 84 73 82  Resp: (!) 23 (!) 24 15 16   Temp:      TempSrc:      SpO2: 96% 100% 96% 98%  Weight:      Height:        Constitutional: Acute alert and oriented x3, no associated distress.  Patient is obese. Skin: no rashes, no lesions, good skin turgor noted. Eyes: Pupils are equally reactive to light.  No evidence of scleral icterus or conjunctival pallor.  ENMT: Moist mucous membranes noted.  Posterior pharynx clear of any exudate or lesions.   Neck: normal, supple, no masses, no thyromegaly.  No evidence of jugular venous distension.   Respiratory: clear to auscultation bilaterally, no wheezing, no crackles. Normal respiratory effort. No accessory muscle use.  Cardiovascular: Regular rate and rhythm, no murmurs / rubs / gallops.  Notable distal bilateral lower extremity pitting edema.  2+ pedal pulses. No carotid bruits.   Chest:   Nontender without crepitus or deformity.   Back:   Nontender without crepitus or deformity. Abdomen: Abdomen is soft and nontender.  No evidence of intra-abdominal masses.  Positive bowel sounds noted in all quadrants.   Musculoskeletal: No joint deformity upper and lower extremities. Good ROM, no contractures. Normal muscle tone.  Neurologic: Significant nystagmus noted with any manipulation of the head, particularly when the patient turns his head to the left.  CN 2-12 grossly intact. Sensation intact, strength noted to be 5 out of 5 in all 4 extremities.  Patient is following all commands.  Patient is responsive to verbal stimuli.   Psychiatric: Patient is seemingly in a depressed mood with appropriate affect.  Patient seems to possess insight as to theircurrent situation.     Labs on Admission: I have personally reviewed following labs and imaging studies -   CBC: Recent Labs  Lab 04/26/20 1254 04/26/20 1259  WBC 5.6  --   NEUTROABS 4.4  --   HGB 12.3* 12.9*  HCT 38.8* 38.0*  MCV 89.2  --   PLT 289  --    Basic Metabolic Panel: Recent Labs  Lab 04/26/20 1254 04/26/20 1259  NA 137 139  K 4.4 4.3  CL 102 102  CO2 23  --   GLUCOSE 133* 129*  BUN 19 22*  CREATININE 1.02 1.00  CALCIUM 9.3  --    GFR: Estimated Creatinine Clearance: 117.1 mL/min (by C-G formula based on SCr of 1 mg/dL). Liver Function Tests: Recent Labs  Lab 04/26/20 1254  AST 20  ALT 24  ALKPHOS 50  BILITOT 0.7  PROT 7.0  ALBUMIN 3.9   No results for input(s): LIPASE, AMYLASE in the last 168 hours. No results for input(s): AMMONIA in the last 168 hours. Coagulation Profile: Recent Labs  Lab 04/26/20 1254  INR 1.0   Cardiac Enzymes: No results for input(s): CKTOTAL, CKMB, CKMBINDEX, TROPONINI in the last 168 hours. BNP (last 3 results) No results for input(s): PROBNP in the last 8760 hours. HbA1C: No results for input(s): HGBA1C in the last 72 hours. CBG: No results for  input(s): GLUCAP in the last 168 hours. Lipid  Profile: No results for input(s): CHOL, HDL, LDLCALC, TRIG, CHOLHDL, LDLDIRECT in the last 72 hours. Thyroid Function Tests: No results for input(s): TSH, T4TOTAL, FREET4, T3FREE, THYROIDAB in the last 72 hours. Anemia Panel: No results for input(s): VITAMINB12, FOLATE, FERRITIN, TIBC, IRON, RETICCTPCT in the last 72 hours. Urine analysis:    Component Value Date/Time   COLORURINE YELLOW 09/27/2019 0837   APPEARANCEUR CLEAR 09/27/2019 0837   LABSPEC 1.020 09/27/2019 0837   PHURINE 7.5 09/27/2019 0837   GLUCOSEU NEGATIVE 09/27/2019 0837   HGBUR NEGATIVE 09/27/2019 0837   BILIRUBINUR NEGATIVE 09/27/2019 0837   KETONESUR NEGATIVE 09/27/2019 0837   UROBILINOGEN 0.2 09/27/2019 0837   NITRITE NEGATIVE 09/27/2019 0837   LEUKOCYTESUR NEGATIVE 09/27/2019 0837    Radiological Exams on Admission - Personally Reviewed: CT HEAD WO CONTRAST  Result Date: 04/26/2020 CLINICAL DATA:  Dizziness with nausea and vomiting beginning this morning. EXAM: CT HEAD WITHOUT CONTRAST TECHNIQUE: Contiguous axial images were obtained from the base of the skull through the vertex without intravenous contrast. COMPARISON:  None. FINDINGS: Brain: Ventricles, cisterns and other CSF spaces are normal. There is no mass, mass effect, shift of midline structures or acute hemorrhage. No evidence of acute infarction. Vascular: No hyperdense vessel or unexpected calcification. Skull: Normal. Negative for fracture or focal lesion. Sinuses/Orbits: Minimal opacification over the left frontal sinus and anterior ethmoid air cells. Paranasal sinuses are otherwise unremarkable. Orbits are normal. Other: None. IMPRESSION: 1. No acute findings. 2. Minimal chronic sinus inflammatory change. Electronically Signed   By: Elberta Fortis M.D.   On: 04/26/2020 16:45   MR BRAIN WO CONTRAST  Result Date: 04/26/2020 CLINICAL DATA:  Initial evaluation for acute vertigo. EXAM: MRI HEAD WITHOUT CONTRAST  TECHNIQUE: Multiplanar, multiecho pulse sequences of the brain and surrounding structures were obtained without intravenous contrast. COMPARISON:  Prior head CT from earlier the same day. FINDINGS: Brain: Cerebral volume within normal limits for patient age. Few scattered punctate foci of T2/FLAIR hyperintensity noted involving the supratentorial cerebral white matter, nonspecific, but felt to be within normal limits for age. No abnormal foci of restricted diffusion to suggest acute or subacute ischemia. Gray-white matter differentiation well maintained. No encephalomalacia to suggest chronic infarction. No foci of susceptibility artifact to suggest acute or chronic intracranial hemorrhage. No mass lesion, midline shift or mass effect. No hydrocephalus. No extra-axial fluid collection. Major dural sinuses are grossly patent. Pituitary gland and suprasellar region are normal. Midline structures intact and normal. Vascular: Major intracranial vascular flow voids well maintained and normal in appearance. Skull and upper cervical spine: Craniocervical junction normal. Visualized upper cervical spine within normal limits. Bone marrow signal intensity normal. No scalp soft tissue abnormality. Sinuses/Orbits: Globes and orbital soft tissues within normal limits. Scattered mucosal thickening noted within the frontoethmoidal sinuses. Paranasal sinuses are otherwise clear. No mastoid effusion. Inner ear structures normal. Other: None. IMPRESSION: Normal brain MRI for age. No acute intracranial abnormality identified. Electronically Signed   By: Rise Mu M.D.   On: 04/26/2020 21:46    EKG: Personally reviewed.  Rhythm is normal sinus rhythm with heart rate of 72 bpm.  No dynamic ST segment changes appreciated.  Assessment/Plan Principal Problem:   Benign paroxysmal positional vertigo, left   Physical examination is concerning for unilateral severe benign positional vertigo leading to significant gait  disturbance.  Due to severity of symptoms and the fact the patient lives alone up 2 flights of stairs there has been significant concern by the medical staff that the patient is an  unsafe discharge.  We will therefore place patient in observation and place order for physical therapy vestibular therapy to begin in the morning.  Once patient symptoms have improved somewhat patient may be discharged home with home-going Epley maneuvers that he can continue to perform at home.  As needed meclizine and Zofran for symptoms in the meantime  Emergency department staff has already performed a thorough evaluation to evaluate for central causes of vertigo with a negative CT head and negative MRI brain.  I do not clinically believe that the patient is suffering from ear infection, labyrinthitis or Mnire's  Active Problems:   Hypothyroidism   Continue home regimen of Synthroid  Due to surprisingly high dose patient typically takes at home, will obtain TSH.    Essential hypertension   Continue home regimen of antihypertensive therapy  Will provide patient with as needed intravenous antihypertensives for excessively elevated blood pressures.    Thoracic discitis   Recent diagnosis of T8/T9 discitis  Patient is following with outpatient infectious disease with Dr. Luciana Axe  We will continue to provide patient with intravenous ceftriaxone and vancomycin per home regimen via PICC line, patient is to receive approximately 4 more weeks of antibiotic therapy.    Mixed hyperlipidemia    Continue home regimen of statin therapy   Code Status:  Full code Family Communication: deferred   Status is: Observation  The patient remains OBS appropriate and will d/c before 2 midnights.  Dispo: The patient is from: Home              Anticipated d/c is to: Home              Anticipated d/c date is: 2 days              Patient currently is not medically stable to d/c.        Marinda Elk  MD [facility] Pager [phone]  If 7PM-7AM, please contact night-coverage [email or web address] Use universal [place] password for that web site. If you do not have the password, please call the hospital operator.  04/27/2020, 1:35 AM

## 2020-04-27 NOTE — Progress Notes (Addendum)
  PROGRESS NOTE  Patient admitted earlier this morning. See H&P.   Pt dx'd w BPPV after being unable to move on 8/15. Presented in evening, given Zofran and meclizine.  He feels better today but still has some spinning when he turns his head to the left.  He has not met with the physical therapy team yet.  Denies any recent illness, fevers, injury to the head, decreased appetite/oral intake.  Of note, he is being treated by the infectious disease team for discitis requiring IV antibiotics.  Pt was unable to complete evaluation by PT due to unsteadiness and will require evaluation tomorrow. Due to this and requiring more than 2 midnights, I will change him to inpatient status from observation as discharge before then is not safe or medically warranted.   Crosby Oyster Justice, DO [facility] 04/27/2020, 2:04 PM  Available via Epic secure chat 7am-7pm After these hours, please refer to coverage provider listed on [email or web address]

## 2020-04-27 NOTE — ED Notes (Signed)
Ordered breakfast 

## 2020-04-27 NOTE — ED Notes (Signed)
PT called and confirmed that pt is on the list to be seen today.

## 2020-04-28 DIAGNOSIS — H8112 Benign paroxysmal vertigo, left ear: Secondary | ICD-10-CM

## 2020-04-28 LAB — CBC
HCT: 33.8 % — ABNORMAL LOW (ref 39.0–52.0)
Hemoglobin: 11 g/dL — ABNORMAL LOW (ref 13.0–17.0)
MCH: 29.3 pg (ref 26.0–34.0)
MCHC: 32.5 g/dL (ref 30.0–36.0)
MCV: 90.1 fL (ref 80.0–100.0)
Platelets: 244 10*3/uL (ref 150–400)
RBC: 3.75 MIL/uL — ABNORMAL LOW (ref 4.22–5.81)
RDW: 14.2 % (ref 11.5–15.5)
WBC: 4.9 10*3/uL (ref 4.0–10.5)
nRBC: 0 % (ref 0.0–0.2)

## 2020-04-28 LAB — BASIC METABOLIC PANEL
Anion gap: 8 (ref 5–15)
BUN: 15 mg/dL (ref 6–20)
CO2: 28 mmol/L (ref 22–32)
Calcium: 8.8 mg/dL — ABNORMAL LOW (ref 8.9–10.3)
Chloride: 102 mmol/L (ref 98–111)
Creatinine, Ser: 1.04 mg/dL (ref 0.61–1.24)
GFR calc Af Amer: >60 mL/min (ref >60)
GFR calc non Af Amer: >60 mL/min (ref >60)
Glucose, Bld: 91 mg/dL (ref 70–99)
Potassium: 4 mmol/L (ref 3.5–5.1)
Sodium: 138 mmol/L (ref 135–145)

## 2020-04-28 MED ORDER — MECLIZINE HCL 25 MG PO TABS: 25.0000 mg | ORAL_TABLET | Freq: Three times a day (TID) | ORAL | 0 refills | Status: DC | PRN

## 2020-04-28 MED ORDER — BACLOFEN 20 MG PO TABS: 10.0000 mg | ORAL_TABLET | Freq: Three times a day (TID) | ORAL | 0 refills | Status: DC | PRN

## 2020-04-28 NOTE — Progress Notes (Signed)
Physical Therapy Treatment Patient Details Name: Randy [name] MRN: [id number] DOB: Jan 06, 1961 Today's Date: 04/28/2020    History of Present Illness 59 year old male with past medical history of hypertension, hyperlipidemia, hypothyroidism and recent diagnosis of T8/T9 discitis currently receiving intravenous antibiotics via PICC line admitted with complaints of severe vertigo.  MRI negative for acute process.    PT Comments    Patient progressing with activity tolerance able to negotiate steps with rails safely enough for d/c with ex-wife to assist with home entry.  Progressed gaze stability exercise as well.  Did note positive supine head roll test with horizontal canal BPPV on R side and issued handout for forced prolonged positioning.  Feel pt should continue to progress with follow up outpatient PT at d/c.  Educated as well in progressing gait at home.   Follow Up Recommendations  Outpatient PT (vestibular rehab)     Equipment Recommendations  Rolling walker with 5" wheels    Recommendations for Other Services       Precautions / Restrictions Precautions Precautions: Fall    Mobility  Bed Mobility Overal bed mobility: Modified Independent                Transfers Overall transfer level: Needs assistance Equipment used: Rolling walker (2 wheeled) Transfers: Sit to/from Stand Sit to Stand: Modified independent (Device/Increase time);Supervision         General transfer comment: S initially then mod I from toilet in bathroom  Ambulation/Gait Ambulation/Gait assistance: Supervision;Min guard Gait Distance (Feet): 200 Feet (& 30' in room no device) Assistive device: Rolling walker (2 wheeled);None Gait Pattern/deviations: Step-through pattern;Decreased stride length     General Gait Details: with RW in hallway, in room to and from bathroom no device   Stairs Stairs: Yes Stairs assistance: Supervision Stair Management: Alternating pattern;Forwards;Two  rails Number of Stairs: 18 General stair comments: paused after 10 HR max 126, then ascended another 8 then descended with S for safety; educated to have ex-wife assist to bring walker up steps   Wheelchair Mobility    Modified Rankin (Stroke Patients Only)       Balance Overall balance assessment: Needs assistance   Sitting balance-Leahy Scale: Good       Standing balance-Leahy Scale: Fair Standing balance comment: able to walk in room with S no device today                            Cognition Arousal/Alertness: Awake/alert Behavior During Therapy: WFL for tasks assessed/performed Overall Cognitive Status: Within Functional Limits for tasks assessed                                        Exercises Other Exercises Other Exercises: Performed seated adaptation exercises x 40-50 seconds vertical and horizontal head turns with near target, symptoms no greater than 2/10; then performed with increased speed of head movement, then in standing with UE support on walker and wide BOS symptoms still 3/10 seated rest between and updated HEP for standing wtih target on wall 3-5' away 4 times/day    General Comments General comments (skin integrity, edema, etc.): Performed supine head roll positive for ageotropic nystagmus more intense on R side; educated in log roll and forced prolonged positioning for treatment with handout given.  Encouraged to try lying on R (involved) side all night first as treatment, then could  attempt log roll; but that outpatient vestbular rehab can address as well.      Pertinent Vitals/Pain Pain Assessment: Faces Faces Pain Scale: Hurts a little bit Pain Location: mid to upper back Pain Descriptors / Indicators: Aching Pain Intervention(s): Monitored during session    Home Living                      Prior Function            PT Goals (current goals can now be found in the care plan section) Progress towards PT  goals: Progressing toward goals    Frequency    Min 3X/week      PT Plan Current plan remains appropriate    Co-evaluation              AM-PAC PT "6 Clicks" Mobility   Outcome Measure  Help needed turning from your back to your side while in a flat bed without using bedrails?: None Help needed moving from lying on your back to sitting on the side of a flat bed without using bedrails?: None Help needed moving to and from a bed to a chair (including a wheelchair)?: None Help needed standing up from a chair using your arms (e.g., wheelchair or bedside chair)?: None Help needed to walk in hospital room?: None Help needed climbing 3-5 steps with a railing? : A Little 6 Click Score: 23    End of Session   Activity Tolerance: Patient tolerated treatment well Patient left: in bed;with call bell/phone within reach   PT Visit Diagnosis: Other abnormalities of gait and mobility (R26.89);Dizziness and giddiness (R42);BPPV BPPV - Right/Left : Right     Time: 6256-3893 PT Time Calculation (min) (ACUTE ONLY): 50 min  Charges:  $Gait Training: 23-37 mins $Neuromuscular Re-education: 8-22 mins                     Sheran Lawless, PT Acute Rehabilitation Services Pager:[phone] Office:[phone] 04/28/2020    Elray Mcgregor 04/28/2020, 8:25 PM

## 2020-04-28 NOTE — Progress Notes (Signed)
PHARMACY CONSULT NOTE FOR:  OUTPATIENT  PARENTERAL ANTIBIOTIC THERAPY (OPAT)  Indication: Thoracic discitis Regimen: Vanc 1750 mg q 24 hours + ceftriaxone 2 gm q 24 hours End date: 05/25/20  IV antibiotic discharge orders are pended. To discharging provider:  please sign these orders via discharge navigator,  Select New Orders & click on the button choice - Manage This Unsigned Work.     Thank you for allowing pharmacy to be a part of this patient's care.  Sharin Mons, PharmD, BCPS, BCIDP Infectious Diseases Clinical Pharmacist Phone: [phone] 04/28/2020, 9:59 AM

## 2020-04-28 NOTE — TOC Initial Note (Addendum)
Transition of Care [facility]) - Initial/Assessment Note    Patient Details  Name: Randy [name] MRN: [id number] Date of Birth: Sep 27, 1960  Transition of Care [facility]) CM/SW Contact:    Kingsley Plan, RN Phone Number: 04/28/2020, 10:57 AM  Clinical Narrative:                  Spoke to patient at bedside. Address in Epic is his mailing address. His physical address is [address] [place], [address].  Patient from home with PICC line and IV ABX. Infusion company is [facility]. Pam with [facility] aware patient was admitted. [facility] arranged for [facility] through [facility].   Patient will need a walker for home. Ordered through Cesar Chavez with [facility].  Discussed OP PT at [facility] on [address] Patient in agreement. NCM confirmed patient's phone number and entered order in Epic . They will call patient for appointment. [facility] information placed on AVS  Expected Discharge Plan: Home w Home Health Services Barriers to Discharge: Continued Medical Work up   Patient Goals and CMS Choice Patient states their goals for this hospitalization and ongoing recovery are:: to return to home CMS Medicare.gov Compare Post Acute Care list provided to:: Patient Choice offered to / list presented to : Patient  Expected Discharge Plan and Services Expected Discharge Plan: Home w Home Health Services   Discharge Planning Services: CM Consult Post Acute Care Choice: Home Health, Durable Medical Equipment Living arrangements for the past 2 months: Apartment                 DME Arranged: Walker rolling DME Agency: [facility] Date DME Agency Contacted: 04/28/20 Time DME Agency Contacted: 1056 Representative spoke with at DME Agency: Ian Malkin HH Arranged: RN HH Agency: Other - See comment ([facility])        Prior Living Arrangements/Services Living arrangements for the past 2 months: Apartment Lives with:: Self Patient language and need for  interpreter reviewed:: Yes              Criminal Activity/Legal Involvement Pertinent to Current Situation/Hospitalization: No - Comment as needed  Activities of Daily Living Home Assistive Devices/Equipment: Eyeglasses ADL Screening (condition at time of admission) Patient's cognitive ability adequate to safely complete daily activities?: Yes Is the patient deaf or have difficulty hearing?: No Does the patient have difficulty seeing, even when wearing glasses/contacts?: No Does the patient have difficulty concentrating, remembering, or making decisions?: No Patient able to express need for assistance with ADLs?: Yes Does the patient have difficulty dressing or bathing?: No Independently performs ADLs?: Yes (appropriate for developmental age) Does the patient have difficulty walking or climbing stairs?: Yes Weakness of Legs: Both Weakness of Arms/Hands: None  Permission Sought/Granted   Permission granted to share information with : Yes, Verbal Permission Granted     Permission granted to share info w AGENCY: [facility] and Neuro [facility]        Emotional Assessment Appearance:: Appears stated age Attitude/Demeanor/Rapport: Engaged Affect (typically observed): Accepting Orientation: : Oriented to Self, Oriented to Place, Oriented to  Time, Oriented to Situation Alcohol / Substance Use: Not Applicable Psych Involvement: No (comment)  Admission diagnosis:  Vertigo [R42] BPPV (benign paroxysmal positional vertigo) [H81.10] Patient Active Problem List   Diagnosis Date Noted  . Mixed hyperlipidemia 04/27/2020  . Benign paroxysmal positional vertigo, left 04/27/2020  . BPPV (benign paroxysmal positional vertigo) 04/27/2020  . PICC (peripherally inserted central catheter) in place 04/22/2020  .  Medication monitoring encounter 04/22/2020  . Thoracic discitis 03/04/2020  . Low back pain 10/24/2019  . Thoracic spine pain 05/16/2019  . Foot pain, left 05/16/2019  . Grief  05/16/2019  . Shoulder pain 03/30/2018  . Flu-like symptoms 10/19/2017  . S/P knee surgery 06/28/2016  . Pain of right heel 01/01/2013  . RUQ abdominal pain 06/19/2012  . Well adult exam 01/27/2012  . Hypogonadism in male 08/24/2009  . 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 COLONIC POLYPS, HX OF 04/29/2009  . Hypothyroidism 06/24/2007  . Dyslipidemia 06/24/2007  . Essential hypertension 06/24/2007  . OBESITY, MORBID 06/22/2007   PCP:  Tresa Garter, MD Pharmacy:   [facility] [address], [place] - [address] [place] [address] Phone: [phone] Fax: [phone]     Social Determinants of Health (SDOH) Interventions    Readmission Risk Interventions No flowsheet data found.

## 2020-04-28 NOTE — Discharge Summary (Signed)
Physician Discharge Summary  Randy [name] ZOX:[id number] DOB: April 20, 1961 DOA: 04/26/2020  PCP: Tresa Garter, MD  Admit date: 04/26/2020 Discharge date: 04/28/2020  Admitted From: Home Disposition: Home with home health services  Recommendations for Outpatient Follow-up:  1. Follow up with PCP in 1-2 weeks 2. Follow-up with outpatient rehab as scheduled. 3. Follow-up with infectious disease clinic as you are scheduled for your long-term antibiotics.  Home Health: Home infusion therapy Equipment/Devices: Rolling walker  Discharge Condition: Stable CODE STATUS: Full code Diet recommendation: Low-salt diet  Discharge summary: 59 year old gentleman with history of hypertension, hyperlipidemia and hypothyroidism who was recently diagnosed with T8/T9 discitis, negative aspiration and currently on IV vancomycin and Rocephin through PICC line presented to the hospital with complaints of severe vertigo.  He woke up in the morning with vertigo, inability to ambulate due to loss of balance.  At the emergency room, his symptoms persist, a CT head and eventual MRI of the brain revealed no evidence of acute CVA.  Patient was unable to ambulate so was admitted to the hospital for further investigations.  Neurologically no acute findings.  Treated symptomatically with IV fluids, meclizine and Zofran with response to the treatment.  Worked with physical therapy.  Has some improvement of symptoms.  With improving symptoms, he is going home.  He will use meclizine as needed.  He will continue outpatient vestibular rehab therapy.  Anticipate symptom improvement gradually. Less likely evidence of vancomycin induced toxicity, his hearing is normal.  I have suggested him to monitor for any loss in hearing or persistent symptoms. Patient on high dose of baclofen for his back pain and spasm, we discussed about decreasing doses and see if that helps with improvement of dizziness.  He will try to cut down to 10  mg 3 times a day.  Patient is on long-term antibiotics with Rocephin and vancomycin and this is done through home health nursing, he will continue.    Discharge Diagnoses:  Principal Problem:   Benign paroxysmal positional vertigo, left Active Problems:   Hypothyroidism   Essential hypertension   Thoracic discitis   Mixed hyperlipidemia   BPPV (benign paroxysmal positional vertigo)    Discharge Instructions  Discharge Instructions    Ambulatory referral to Physical Therapy   Complete by: As directed    Iontophoresis - 4 mg/ml of dexamethasone: No   T.E.N.S. Unit Evaluation and Dispense as Indicated: No   Call MD for:  temperature >100.4   Complete by: As directed    Diet - low sodium heart healthy   Complete by: As directed    Discharge instructions   Complete by: As directed    Try to cut down on your baclofen to see if that helps to improve your symptoms.   Increase activity slowly   Complete by: As directed      Allergies as of 04/28/2020      Reactions   Penicillins    Family history of allergic reaction    Watermelon [citrullus Vulgaris]    Makes throat itch       Medication List    STOP taking these medications   meloxicam 15 MG tablet Commonly known as: MOBIC   tiZANidine 4 MG tablet Commonly known as: Zanaflex     TAKE these medications   acetaminophen-codeine 300-30 MG tablet Commonly known as: TYLENOL #3 Take 1 tablet by mouth 4 (four) times daily as needed for severe pain.   amLODipine 5 MG tablet Commonly known as: NORVASC Take 1  tablet (5 mg total) by mouth daily.   aspirin EC 81 MG tablet Take 81 mg by mouth daily. Swallow whole.   B-12 2000 MCG Tabs Take 1 tablet by mouth every morning.   baclofen 20 MG tablet Commonly known as: LIORESAL Take 0.5 tablets (10 mg total) by mouth 3 (three) times daily as needed for muscle spasms. What changed: how much to take   benazepril 20 MG tablet Commonly known as: LOTENSIN Take 1 tablet (20  mg total) by mouth daily.   cefTRIAXone  IVPB Commonly known as: ROCEPHIN Inject 2 g into the vein daily. Infuse IV over 10 minutes every 24 hours   CoQ10 100 MG Caps Take 1 capsule by mouth every morning.   ibuprofen 200 MG tablet Commonly known as: ADVIL Take 800 mg by mouth 3 (three) times daily.   levothyroxine 125 MCG tablet Commonly known as: SYNTHROID TAKE TWO TABLETS BY MOUTH ONCE DAILY BEFORE BREAKFAST. PT NEEDS OV FOR FUTURE REFILLS What changed:   how much to take  how to take this  when to take this  additional instructions   lovastatin 40 MG tablet Commonly known as: MEVACOR Take 1 tablet (40 mg total) by mouth at bedtime. Pt need appt for future refills   meclizine 25 MG tablet Commonly known as: ANTIVERT Take 1 tablet (25 mg total) by mouth 3 (three) times daily as needed for dizziness.   multivitamin tablet Take 1 tablet by mouth every morning.   oxyCODONE-acetaminophen 10-325 MG tablet Commonly known as: PERCOCET Take 1 tablet by mouth at bedtime.   triamterene-hydrochlorothiazide 37.5-25 MG tablet Commonly known as: MAXZIDE-25 TAKE 1 TABLET BY MOUTH ONCE DAILY. PT NEEDS APPT FOR FUTURE REFILLS What changed:   how much to take  how to take this  when to take this  additional instructions   Turmeric 500 MG Tabs Take 1 capsule by mouth every morning.   vancomycin  IVPB Inject into the vein See admin instructions. 1750/29050ml NS HP, Infuse IV over 85 minutes every 24 hours   vitamin C with rose hips 1000 MG tablet Take 5,000 mg by mouth daily.   Vitamin D3 50 MCG (2000 UT) Tabs Take 2,000 Units by mouth daily.            Durable Medical Equipment  (From admission, onward)         Start     Ordered   04/28/20 1102  For home use only DME Walker rolling  Once       Question Answer Comment  Walker: With 5 Inch Wheels   Patient needs a walker to treat with the following condition Weakness      04/28/20 1101   04/28/20 0755   For home use only DME Walker rolling  Once       Question Answer Comment  Walker: With 5 Inch Wheels   Patient needs a walker to treat with the following condition Vertigo      04/28/20 0755          Follow-up Information    [facility] Follow [facility].   Specialty: Rehabilitation Contact information: [address] [id number] mc [place] [phone]             Allergies  Allergen Reactions  . Penicillins     Family history of allergic reaction   . Watermelon [Citrullus Vulgaris]     Makes throat itch     Consultations:  None   Procedures/Studies:  CT HEAD WO CONTRAST  Result Date: 04/26/2020 CLINICAL DATA:  Dizziness with nausea and vomiting beginning this morning. EXAM: CT HEAD WITHOUT CONTRAST TECHNIQUE: Contiguous axial images were obtained from the base of the skull through the vertex without intravenous contrast. COMPARISON:  None. FINDINGS: Brain: Ventricles, cisterns and other CSF spaces are normal. There is no mass, mass effect, shift of midline structures or acute hemorrhage. No evidence of acute infarction. Vascular: No hyperdense vessel or unexpected calcification. Skull: Normal. Negative for fracture or focal lesion. Sinuses/Orbits: Minimal opacification over the left frontal sinus and anterior ethmoid air cells. Paranasal sinuses are otherwise unremarkable. Orbits are normal. Other: None. IMPRESSION: 1. No acute findings. 2. Minimal chronic sinus inflammatory change. Electronically Signed   By: Elberta Fortis M.D.   On: 04/26/2020 16:45   MR BRAIN WO CONTRAST  Result Date: 04/26/2020 CLINICAL DATA:  Initial evaluation for acute vertigo. EXAM: MRI HEAD WITHOUT CONTRAST TECHNIQUE: Multiplanar, multiecho pulse sequences of the brain and surrounding structures were obtained without intravenous contrast. COMPARISON:  Prior head CT from earlier the same day. FINDINGS: Brain: Cerebral volume  within normal limits for patient age. Few scattered punctate foci of T2/FLAIR hyperintensity noted involving the supratentorial cerebral white matter, nonspecific, but felt to be within normal limits for age. No abnormal foci of restricted diffusion to suggest acute or subacute ischemia. Gray-white matter differentiation well maintained. No encephalomalacia to suggest chronic infarction. No foci of susceptibility artifact to suggest acute or chronic intracranial hemorrhage. No mass lesion, midline shift or mass effect. No hydrocephalus. No extra-axial fluid collection. Major dural sinuses are grossly patent. Pituitary gland and suprasellar region are normal. Midline structures intact and normal. Vascular: Major intracranial vascular flow voids well maintained and normal in appearance. Skull and upper cervical spine: Craniocervical junction normal. Visualized upper cervical spine within normal limits. Bone marrow signal intensity normal. No scalp soft tissue abnormality. Sinuses/Orbits: Globes and orbital soft tissues within normal limits. Scattered mucosal thickening noted within the frontoethmoidal sinuses. Paranasal sinuses are otherwise clear. No mastoid effusion. Inner ear structures normal. Other: None. IMPRESSION: Normal brain MRI for age. No acute intracranial abnormality identified. Electronically Signed   By: Rise Mu M.D.   On: 04/26/2020 21:46   IR CERVICAL/THORACIC DISC ASP W/IMAG GUI  Result Date: 03/31/2020 INDICATION: 59 year old male with past medical history significant for HTN, HLD, hypothyroidism, obesity. Patient was diagnosed with lower extremity cellulitis in February 2021 followed by lower back pain. A thoracic MRI Was performed on 6.14.21 which indicated thoracic discitis. Patient was then referred Infectious disease requested thoracic disc aspiration for treatment planning. EXAM: FLUOROSCOPY GUIDED T8-9 DISC ASPIRATION MEDICATIONS: The patient is currently admitted to the  hospital and receiving intravenous antibiotics. The antibiotics were administered within an appropriate time frame prior to the initiation of the procedure. ANESTHESIA/SEDATION: Fentanyl 100 mcg IV; Versed 2 mg IV Moderate Sedation Time:  26 The patient was continuously monitored during the procedure by the interventional radiology nurse under my direct supervision. COMPLICATIONS: None immediate. PROCEDURE: Informed written consent was obtained from the patient after a thorough discussion of the procedural risks, benefits and alternatives. All questions were addressed. Maximal Sterile Barrier Technique was utilized including caps, mask, sterile gowns, sterile gloves, sterile drape, hand hygiene and skin antiseptic. A timeout was performed prior to the initiation of the procedure. The thoracic region was prepped and draped in the usual sterile fashion. The T8-9 disc space was identified under fluoroscopy. The skin entry was marked and infusion of lidocaine 1% was  performed at the access site. Thereafter, a 16 gauge x 15 cm trocar needle was advanced under fluoroscopic guidance via left lateral extra pedicular approach into the T8-9 disc space. A 22 gauge x 20 cm was then coaxially navigated into the center of the disc space. Three fine needle aspiration samples were collected with blood tinged aspirate. The needles were subsequently removed. The skin was cleaned and a sterile bandage was placed at the access site. No immediate periprocedural complication. IMPRESSION: Successful fluoroscopy guided T8-9 disc aspiration. Samples collected were sent for microbiology studies. Electronically Signed   By: Baldemar Lenis M.D.   On: 03/31/2020 11:06   IRPICC PLACEMENT LEFT >5 YRS INC IMG GUIDE  Result Date: 04/01/2020 CLINICAL DATA:  Discitis, needs venous access for antibiotic therapy EXAM: PICC PLACEMENT WITH ULTRASOUND AND FLUOROSCOPY FLUOROSCOPY TIME:  0.1 minute TECHNIQUE: After written informed  consent was obtained, patient was placed in the supine position on angiographic table. Patency of the left basilic vein was confirmed with ultrasound with image documentation. An appropriate skin site was determined. Skin site was marked. Region was prepped using maximum barrier technique including cap and mask, sterile gown, sterile gloves, large sterile sheet, and Chlorhexidine as cutaneous antisepsis. The region was infiltrated locally with 1% lidocaine. Under real-time ultrasound guidance, the left basilic vein was accessed with a 21 gauge micropuncture needle; the needle tip within the vein was confirmed with ultrasound image documentation. Needle exchanged over a 018 guidewire for a peel-away sheath, through which a 5-French power-injectable PICC trimmed to 43cm was advanced, positioned with its tip near the cavoatrial junction. Spot chest radiograph confirms appropriate catheter position. Catheter was flushed per protocol and secured externally. The patient tolerated procedure well. COMPLICATIONS: COMPLICATIONS none IMPRESSION: 1.  Technically successful five French left arm PICC placement Electronically Signed   By: Corlis Leak M.D.   On: 04/01/2020 09:58   (Echo, Carotid, EGD, Colonoscopy, ERCP)    Subjective: Patient was seen and examined.  No overnight events.  Still feels somehow unsteady on walking, however his symptoms are much improved than before.  Denies any ear pain.  Denies any hearing loss.   Discharge Exam: Vitals:   04/28/20 0635 04/28/20 1238  BP: (!) 145/82 (!) 152/73  Pulse: 78 82  Resp: 18 18  Temp: 98.3 F (36.8 C) 98.2 F (36.8 C)  SpO2: 96% 94%   Vitals:   04/27/20 1804 04/28/20 0031 04/28/20 0635 04/28/20 1238  BP: (!) 150/65 124/61 (!) 145/82 (!) 152/73  Pulse: 81 69 78 82  Resp: 16 18 18 18   Temp: (!) 97.5 F (36.4 C) 98.5 F (36.9 C) 98.3 F (36.8 C) 98.2 F (36.8 C)  TempSrc: Oral Oral Oral Oral  SpO2: 98% 93% 96% 94%  Weight:      Height:         General: Pt is alert, awake, not in acute distress Cardiovascular: RRR, S1/S2 +, no rubs, no gallops Respiratory: CTA bilaterally, no wheezing, no rhonchi Abdominal: Soft, NT, ND, bowel sounds + Extremities: no edema, no cyanosis No neurological deficits. Left forearm with PICC line.    The results of significant diagnostics from this hospitalization (including imaging, microbiology, ancillary and laboratory) are listed below for reference.     Microbiology: Recent Results (from the past 240 hour(s))  SARS Coronavirus 2 by RT PCR (hospital order, performed in [facility] hospital lab) Nasopharyngeal Nasopharyngeal Swab     Status: None   Collection Time: 04/27/20  1:05 AM  Specimen: Nasopharyngeal Swab  Result Value Ref Range Status   SARS Coronavirus 2 NEGATIVE NEGATIVE Final    Comment: (NOTE) SARS-CoV-2 target nucleic acids are NOT DETECTED.  The SARS-CoV-2 RNA is generally detectable in upper and lower respiratory specimens during the acute phase of infection. The lowest concentration of SARS-CoV-2 viral copies this assay can detect is 250 copies / mL. A negative result does not preclude SARS-CoV-2 infection and should not be used as the sole basis for treatment or other patient management decisions.  A negative result may occur with improper specimen collection / handling, submission of specimen other than nasopharyngeal swab, presence of viral mutation(s) within the areas targeted by this assay, and inadequate number of viral copies (<250 copies / mL). A negative result must be combined with clinical observations, patient history, and epidemiological information.  Fact Sheet for Patients:   BoilerBrush.com.cy  Fact Sheet for Healthcare Providers: https://pope.com/  This test is not yet approved or  cleared by the [place] FDA and has been authorized for detection and/or diagnosis of SARS-CoV-2 by FDA under an  Emergency Use Authorization (EUA).  This EUA will remain in effect (meaning this test can be used) for the duration of the COVID-19 declaration under Section 564(b)(1) of the Act, 21 U.S.C. section 360bbb-3(b)(1), unless the authorization is terminated or revoked sooner.  Performed at [facility], [address], [place], [place] [address]      Labs: BNP (last 3 results) No results for input(s): BNP in the last 8760 hours. Basic Metabolic Panel: Recent Labs  Lab 04/26/20 1254 04/26/20 1259 04/27/20 0425 04/28/20 0155  NA 137 139 138 138  K 4.4 4.3 3.5 4.0  CL 102 102 103 102  CO2 23  --  25 28  GLUCOSE 133* 129* 98 91  BUN 19 22* 14 15  CREATININE 1.02 1.00 1.01 1.04  CALCIUM 9.3  --  8.8* 8.8*  MG  --   --  2.1  --    Liver Function Tests: Recent Labs  Lab 04/26/20 1254 04/27/20 0425  AST 20 15  ALT 24 20  ALKPHOS 50 40  BILITOT 0.7 0.6  PROT 7.0 5.9*  ALBUMIN 3.9 3.2*   No results for input(s): LIPASE, AMYLASE in the last 168 hours. No results for input(s): AMMONIA in the last 168 hours. CBC: Recent Labs  Lab 04/26/20 1254 04/26/20 1259 04/27/20 0425 04/28/20 0155  WBC 5.6  --  5.2 4.9  NEUTROABS 4.4  --  3.1  --   HGB 12.3* 12.9* 10.5* 11.0*  HCT 38.8* 38.0* 33.1* 33.8*  MCV 89.2  --  89.9 90.1  PLT 289  --  248 244   Cardiac Enzymes: No results for input(s): CKTOTAL, CKMB, CKMBINDEX, TROPONINI in the last 168 hours. BNP: Invalid input(s): POCBNP CBG: No results for input(s): GLUCAP in the last 168 hours. D-Dimer No results for input(s): DDIMER in the last 72 hours. Hgb A1c No results for input(s): HGBA1C in the last 72 hours. Lipid Profile No results for input(s): CHOL, HDL, LDLCALC, TRIG, CHOLHDL, LDLDIRECT in the last 72 hours. Thyroid function studies Recent Labs    04/27/20 0425  TSH 0.406   Anemia work up No results for input(s): VITAMINB12, FOLATE, FERRITIN, TIBC, IRON, RETICCTPCT in the last 72 hours. Urinalysis     Component Value Date/Time   COLORURINE YELLOW 09/27/2019 0837   APPEARANCEUR CLEAR 09/27/2019 0837   LABSPEC 1.020 09/27/2019 0837   PHURINE 7.5 09/27/2019 9794  GLUCOSEU NEGATIVE 09/27/2019 0837   HGBUR NEGATIVE 09/27/2019 0837   BILIRUBINUR NEGATIVE 09/27/2019 0837   KETONESUR NEGATIVE 09/27/2019 0837   UROBILINOGEN 0.2 09/27/2019 0837   NITRITE NEGATIVE 09/27/2019 0837   LEUKOCYTESUR NEGATIVE 09/27/2019 0837   Sepsis Labs Invalid input(s): PROCALCITONIN,  WBC,  LACTICIDVEN Microbiology Recent Results (from the past 240 hour(s))  SARS Coronavirus 2 by RT PCR (hospital order, performed in [facility] hospital lab) Nasopharyngeal Nasopharyngeal Swab     Status: None   Collection Time: 04/27/20  1:05 AM   Specimen: Nasopharyngeal Swab  Result Value Ref Range Status   SARS Coronavirus 2 NEGATIVE NEGATIVE Final    Comment: (NOTE) SARS-CoV-2 target nucleic acids are NOT DETECTED.  The SARS-CoV-2 RNA is generally detectable in upper and lower respiratory specimens during the acute phase of infection. The lowest concentration of SARS-CoV-2 viral copies this assay can detect is 250 copies / mL. A negative result does not preclude SARS-CoV-2 infection and should not be used as the sole basis for treatment or other patient management decisions.  A negative result may occur with improper specimen collection / handling, submission of specimen other than nasopharyngeal swab, presence of viral mutation(s) within the areas targeted by this assay, and inadequate number of viral copies (<250 copies / mL). A negative result must be combined with clinical observations, patient history, and epidemiological information.  Fact Sheet for Patients:   BoilerBrush.com.cy  Fact Sheet for Healthcare Providers: https://pope.com/  This test is not yet approved or  cleared by the [place] FDA and has been authorized for detection and/or diagnosis  of SARS-CoV-2 by FDA under an Emergency Use Authorization (EUA).  This EUA will remain in effect (meaning this test can be used) for the duration of the COVID-19 declaration under Section 564(b)(1) of the Act, 21 U.S.C. section 360bbb-3(b)(1), unless the authorization is terminated or revoked sooner.  Performed at [facility], [address] [place], [place] [address]      Time coordinating discharge:  35 minutes  SIGNED:   Dorcas Carrow, MD  [facility] 04/28/2020, 1:43 PM

## 2020-04-28 NOTE — Progress Notes (Signed)
Office: [phone]  /  Fax: [phone]    Date: May 12, 2020   Appointment Start Time: 4:31pm Duration: 23 minutes Provider: Lawerance Cruel, Psy.D. 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 male presenting for a follow-up appointment to address the previously established treatment goal of increasing coping skills. Today's appointment was a telepsychological visit due to COVID-19. Pernell provided verbal consent for today's telepsychological appointment and he is aware he is responsible for securing confidentiality on his end of the session. Prior to proceeding with today's appointment, Carmello's physical location at the time of this appointment was obtained as well a phone number he could be reached at in the event of technical difficulties. Skipper and this provider participated in today's telepsychological service.   This provider conducted a brief check-in. Tremel shared he was recently hospitalized due to vertigo, noting it has improved with provided exercises and medication. He stated he also has been focusing on "getting in the swing of teaching." This provider recommended longer-term traditional therapeutic services due to ongoing stressors, specifically due to medical concerns. Associated ambivalence was processed. He stated, "Down the road I would be willing to consider therapy services." As such, he provided verbal consent for this provider to e-mail a list of referral options. Aeron also expressed ambivalence about scheduling an appointment with Dr. [name] due to his current schedule and bills. This was further explored and processed. He noted a plan to reach out to the clinic in the coming weeks. Regarding eating, Ricard discussed implementing shared strategies to help him improve eating congruent to his meal plan,  especially while at work. Positive reinforcement was provided. Daylan also discussed making better choices and engaging in portion control. He added a reduction in emotional eating. Positive reinforcement was provided. Furthermore, skills/strategies learned were reviewed. Overall, Kohner was receptive to today's appointment as evidenced by openness to sharing, responsiveness to feedback, and willingness to continue engaging in learned skills.   Mental Status Examination:  Appearance: well groomed and appropriate hygiene  Behavior: appropriate to circumstances Mood: euthymic Affect: mood congruent Speech: normal in rate, volume, and tone Eye Contact: appropriate Psychomotor Activity: appropriate Gait: unable to assess Thought Process: linear, logical, and goal directed  Thought Content/Perception: no hallucinations, delusions, bizarre thinking or behavior reported or observed and no evidence of suicidal and homicidal ideation, plan, and intent Orientation: time, person, place, and purpose of appointment Memory/Concentration: memory, attention, language, and fund of knowledge intact  Insight/Judgment: fair  Interventions:  Conducted a brief chart review Provided empathic reflections and validation Employed supportive psychotherapy interventions to facilitate reduced distress and to improve coping skills with identified stressors Reviewed learned skills Recommended/discussed option for longer-term therapeutic services  Provided positive reinforcement   DSM-5 Diagnosis(es): 307.59 (F50.8) Other Specified Feeding or Eating Disorder, Emotional Eating Behaviors  Treatment Goal & Progress: During the initial appointment with this provider, the following treatment goal was established: increase coping skills. Wendelin demonstrated progress in his goal as evidenced by increased awareness of hunger patterns and increased awareness of triggers for emotional eating. Kenji also continues to demonstrate  willingness to engage in learned skill(s).  Plan: Lovis declined future appointments with this provider, noting, "I'm feeling pretty good." He acknowledged understanding that he may request a follow-up appointment with this provider in the future as long as he is still established with the clinic.  No further follow-up planned by this provider.

## 2020-04-28 NOTE — Progress Notes (Signed)
Spoke with Carlyon Shadow and made her aware that IV team does not provide the long extensions that patient's go home with to administer home medications. Reached out to [facility] C RN with [facility] and she stated she would provide the patient with another extension for his home infusions. She also stated she would call Pieter Partridge RN to let her know her ETA with extension.

## 2020-04-30 ENCOUNTER — Encounter: Payer: Self-pay | Admitting: Internal Medicine

## 2020-04-30 ENCOUNTER — Other Ambulatory Visit: Payer: Self-pay

## 2020-04-30 ENCOUNTER — Ambulatory Visit (INDEPENDENT_AMBULATORY_CARE_PROVIDER_SITE_OTHER): Payer: BC Managed Care – PPO | Admitting: Internal Medicine

## 2020-04-30 DIAGNOSIS — I10 Essential (primary) hypertension: Secondary | ICD-10-CM

## 2020-04-30 DIAGNOSIS — M4644 Discitis, unspecified, thoracic region: Secondary | ICD-10-CM

## 2020-04-30 DIAGNOSIS — M5441 Lumbago with sciatica, right side: Secondary | ICD-10-CM

## 2020-04-30 DIAGNOSIS — E039 Hypothyroidism, unspecified: Secondary | ICD-10-CM

## 2020-04-30 NOTE — Progress Notes (Signed)
Subjective:  Patient ID: Randy [name], male    DOB: [birth year]  Age: 59 y.o. MRN: [id number]  CC: No chief complaint on file.   HPI KHYLEN RIOLO presents for vertigo - 90 % better - R ear F/u HTN, diskitis No falls  Outpatient Medications Prior to Visit  Medication Sig Dispense Refill  . acetaminophen-codeine (TYLENOL #3) 300-30 MG tablet Take 1 tablet by mouth 4 (four) times daily as needed for severe pain.     Marland Kitchen amLODipine (NORVASC) 5 MG tablet Take 1 tablet (5 mg total) by mouth daily. 90 tablet 3  . Ascorbic Acid (VITAMIN C WITH ROSE HIPS) 1000 MG tablet Take 5,000 mg by mouth daily.    Marland Kitchen aspirin EC 81 MG tablet Take 81 mg by mouth daily. Swallow whole.    . baclofen (LIORESAL) 20 MG tablet Take 0.5 tablets (10 mg total) by mouth 3 (three) times daily as needed for muscle spasms. 30 each 0  . benazepril (LOTENSIN) 20 MG tablet Take 1 tablet (20 mg total) by mouth daily. 90 tablet 3  . cefTRIAXone (ROCEPHIN) IVPB Inject 2 g into the vein daily. Infuse IV over 10 minutes every 24 hours    . Cholecalciferol (VITAMIN D3) 50 MCG (2000 UT) TABS Take 2,000 Units by mouth daily.     . Coenzyme Q10 (COQ10) 100 MG CAPS Take 1 capsule by mouth every morning.    . Cyanocobalamin (B-12) 2000 MCG TABS Take 1 tablet by mouth every morning.    Marland Kitchen ibuprofen (ADVIL) 200 MG tablet Take 800 mg by mouth 3 (three) times daily.    Marland Kitchen levothyroxine (SYNTHROID) 125 MCG tablet TAKE TWO TABLETS BY MOUTH ONCE DAILY BEFORE BREAKFAST. PT NEEDS OV FOR FUTURE REFILLS (Patient taking differently: Take 250 mcg by mouth daily before breakfast. ) 180 tablet 0  . lovastatin (MEVACOR) 40 MG tablet Take 1 tablet (40 mg total) by mouth at bedtime. Pt need appt for future refills 90 tablet 0  . meclizine (ANTIVERT) 25 MG tablet Take 1 tablet (25 mg total) by mouth 3 (three) times daily as needed for dizziness. 30 tablet 0  . Multiple Vitamin (MULTIVITAMIN) tablet Take 1 tablet by mouth every morning.    Marland Kitchen  oxyCODONE-acetaminophen (PERCOCET) 10-325 MG tablet Take 1 tablet by mouth at bedtime.     . triamterene-hydrochlorothiazide (MAXZIDE-25) 37.5-25 MG tablet TAKE 1 TABLET BY MOUTH ONCE DAILY. PT NEEDS APPT FOR FUTURE REFILLS (Patient taking differently: Take 1 tablet by mouth daily. ) 90 tablet 0  . Turmeric 500 MG TABS Take 1 capsule by mouth every morning.    . vancomycin IVPB Inject into the vein See admin instructions. 1750/222ml NS HP, Infuse IV over 85 minutes every 24 hours     No facility-administered medications prior to visit.    ROS: Review of Systems  Constitutional: Negative for appetite change, fatigue and unexpected weight change.  HENT: Negative for congestion, nosebleeds, sneezing, sore throat and trouble swallowing.   Eyes: Negative for itching and visual disturbance.  Respiratory: Negative for cough.   Cardiovascular: Negative for chest pain, palpitations and leg swelling.  Gastrointestinal: Negative for abdominal distention, blood in stool, diarrhea and nausea.  Genitourinary: Negative for frequency and hematuria.  Musculoskeletal: Positive for back pain. Negative for gait problem, joint swelling and neck pain.  Skin: Negative for rash.  Neurological: Positive for dizziness. Negative for tremors, speech difficulty and weakness.  Psychiatric/Behavioral: Negative for agitation, dysphoric mood and sleep disturbance. The patient is not  nervous/anxious.     Objective:  BP 128/84   Pulse 93   Temp 98.2 F (36.8 C) (Oral)   Ht 6' (1.829 m)   Wt (!) 349 lb (158.3 kg)   SpO2 96%   BMI 47.33 kg/m   BP Readings from Last 3 Encounters:  04/30/20 128/84  04/28/20 (!) 152/73  03/31/20 (!) 123/56    Wt Readings from Last 3 Encounters:  04/30/20 (!) 349 lb (158.3 kg)  04/26/20 (!) 324 lb (147 kg)  04/22/20 (!) 324 lb (147 kg)    Physical Exam Constitutional:      General: He is not in acute distress.    Appearance: He is well-developed. He is obese.     Comments:  NAD  Eyes:     Conjunctiva/sclera: Conjunctivae normal.     Pupils: Pupils are equal, round, and reactive to light.  Neck:     Thyroid: No thyromegaly.     Vascular: No JVD.  Cardiovascular:     Rate and Rhythm: Normal rate and regular rhythm.     Heart sounds: Normal heart sounds. No murmur heard.  No friction rub. No gallop.   Pulmonary:     Effort: Pulmonary effort is normal. No respiratory distress.     Breath sounds: Normal breath sounds. No wheezing or rales.  Chest:     Chest wall: No tenderness.  Abdominal:     General: Bowel sounds are normal. There is no distension.     Palpations: Abdomen is soft. There is no mass.     Tenderness: There is no abdominal tenderness. There is no guarding or rebound.  Musculoskeletal:        General: No tenderness. Normal range of motion.     Cervical back: Normal range of motion.  Lymphadenopathy:     Cervical: No cervical adenopathy.  Skin:    General: Skin is warm and dry.     Findings: No rash.  Neurological:     Mental Status: He is alert and oriented to person, place, and time.     Cranial Nerves: No cranial nerve deficit.     Motor: No abnormal muscle tone.     Coordination: Coordination normal.     Gait: Gait abnormal.     Deep Tendon Reflexes: Reflexes are normal and symmetric.  Psychiatric:        Behavior: Behavior normal.        Thought Content: Thought content normal.        Judgment: Judgment normal.     Lab Results  Component Value Date   WBC 4.9 04/28/2020   HGB 11.0 (L) 04/28/2020   HCT 33.8 (L) 04/28/2020   PLT 244 04/28/2020   GLUCOSE 91 04/28/2020   CHOL 142 02/20/2020   TRIG 78 02/20/2020   HDL 38 (L) 02/20/2020   LDLCALC 89 02/20/2020   ALT 20 04/27/2020   AST 15 04/27/2020   NA 138 04/28/2020   K 4.0 04/28/2020   CL 102 04/28/2020   CREATININE 1.04 04/28/2020   BUN 15 04/28/2020   CO2 28 04/28/2020   TSH 0.406 04/27/2020   PSA 0.65 09/27/2019   INR 1.0 04/26/2020   HGBA1C 5.8 (H) 02/20/2020     CT HEAD WO CONTRAST  Result Date: 04/26/2020 CLINICAL DATA:  Dizziness with nausea and vomiting beginning this morning. EXAM: CT HEAD WITHOUT CONTRAST TECHNIQUE: Contiguous axial images were obtained from the base of the skull through the vertex without intravenous contrast. COMPARISON:  None. FINDINGS: Brain:  Ventricles, cisterns and other CSF spaces are normal. There is no mass, mass effect, shift of midline structures or acute hemorrhage. No evidence of acute infarction. Vascular: No hyperdense vessel or unexpected calcification. Skull: Normal. Negative for fracture or focal lesion. Sinuses/Orbits: Minimal opacification over the left frontal sinus and anterior ethmoid air cells. Paranasal sinuses are otherwise unremarkable. Orbits are normal. Other: None. IMPRESSION: 1. No acute findings. 2. Minimal chronic sinus inflammatory change. Electronically Signed   By: Elberta Fortis M.D.   On: 04/26/2020 16:45   MR BRAIN WO CONTRAST  Result Date: 04/26/2020 CLINICAL DATA:  Initial evaluation for acute vertigo. EXAM: MRI HEAD WITHOUT CONTRAST TECHNIQUE: Multiplanar, multiecho pulse sequences of the brain and surrounding structures were obtained without intravenous contrast. COMPARISON:  Prior head CT from earlier the same day. FINDINGS: Brain: Cerebral volume within normal limits for patient age. Few scattered punctate foci of T2/FLAIR hyperintensity noted involving the supratentorial cerebral white matter, nonspecific, but felt to be within normal limits for age. No abnormal foci of restricted diffusion to suggest acute or subacute ischemia. Gray-white matter differentiation well maintained. No encephalomalacia to suggest chronic infarction. No foci of susceptibility artifact to suggest acute or chronic intracranial hemorrhage. No mass lesion, midline shift or mass effect. No hydrocephalus. No extra-axial fluid collection. Major dural sinuses are grossly patent. Pituitary gland and suprasellar region are  normal. Midline structures intact and normal. Vascular: Major intracranial vascular flow voids well maintained and normal in appearance. Skull and upper cervical spine: Craniocervical junction normal. Visualized upper cervical spine within normal limits. Bone marrow signal intensity normal. No scalp soft tissue abnormality. Sinuses/Orbits: Globes and orbital soft tissues within normal limits. Scattered mucosal thickening noted within the frontoethmoidal sinuses. Paranasal sinuses are otherwise clear. No mastoid effusion. Inner ear structures normal. Other: None. IMPRESSION: Normal brain MRI for age. No acute intracranial abnormality identified. Electronically Signed   By: Rise Mu M.D.   On: 04/26/2020 21:46    Assessment & Plan:    Sonda Primes, MD

## 2020-04-30 NOTE — Assessment & Plan Note (Addendum)
On IV abx x4 more weeks F/u w/dr Luciana Axe

## 2020-04-30 NOTE — Assessment & Plan Note (Addendum)
Better per pt.

## 2020-04-30 NOTE — Patient Instructions (Signed)
Randy [name] starts COVID19 vaccine booster sign up on 04/30/2020 at 8:00 in the morning.  Please call [place] [facility] at [phone]. I do not have any additional information at the moment. Thanks, AP   

## 2020-05-07 NOTE — Addendum Note (Signed)
Encounter addended by: Carlyon Prows on: 05/07/2020 8:28 AM  Actions taken: Imaging Exam ended

## 2020-05-12 ENCOUNTER — Other Ambulatory Visit: Payer: Self-pay

## 2020-05-12 ENCOUNTER — Telehealth (INDEPENDENT_AMBULATORY_CARE_PROVIDER_SITE_OTHER): Payer: BC Managed Care – PPO | Admitting: Psychology

## 2020-05-12 ENCOUNTER — Encounter: Payer: Self-pay | Admitting: Internal Medicine

## 2020-05-12 DIAGNOSIS — F5089 Other specified eating disorder: Secondary | ICD-10-CM | POA: Diagnosis not present

## 2020-05-12 NOTE — Assessment & Plan Note (Signed)
Diskitis - on IV abx

## 2020-05-12 NOTE — Assessment & Plan Note (Addendum)
Wt Readings from Last 3 Encounters:  04/30/20 (!) 349 lb (158.3 kg)  04/26/20 (!) 324 lb (147 kg)  04/22/20 (!) 324 lb (147 kg)   Lotrel, Maxzide

## 2020-05-12 NOTE — Assessment & Plan Note (Signed)
Labs

## 2020-05-13 ENCOUNTER — Ambulatory Visit: Payer: BC Managed Care – PPO | Admitting: Infectious Diseases

## 2020-05-14 LAB — ACID FAST CULTURE WITH REFLEXED SENSITIVITIES (MYCOBACTERIA): Acid Fast Culture: NEGATIVE

## 2020-05-20 ENCOUNTER — Other Ambulatory Visit: Payer: Self-pay

## 2020-05-20 ENCOUNTER — Telehealth: Payer: Self-pay

## 2020-05-20 ENCOUNTER — Encounter: Payer: Self-pay | Admitting: Infectious Diseases

## 2020-05-20 ENCOUNTER — Ambulatory Visit (INDEPENDENT_AMBULATORY_CARE_PROVIDER_SITE_OTHER): Payer: BC Managed Care – PPO | Admitting: Infectious Diseases

## 2020-05-20 VITALS — BP 162/83 | HR 74 | Temp 98.6°F | Ht 72.0 in | Wt 337.0 lb

## 2020-05-20 DIAGNOSIS — M4644 Discitis, unspecified, thoracic region: Secondary | ICD-10-CM | POA: Diagnosis not present

## 2020-05-20 NOTE — Progress Notes (Addendum)
[facility]                                                             [place], [place], [place], [address]                                                                  Phn. [phone]; Fax: [phone]                                                                             Date: 05/20/20  Reason for Follow Up: Thoracic Discitis   Assessment 59 Y O Male who is here for  1. 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 On IV Vancomycin and ceftriaxone ESR 58>6 CRP 44.9>2 2. Medication Monitoring - Electrolytes and Cr have been stable  Plan Continue Vancomycin and ceftriaxone as is until 05/25/20 as initially planned to complete 8 weeks course ESR and CRP today Monitor CBC and CMP while on IV abx PICC line to be removed after completion of IV abx  F/u PRN   Rosiland Oz, MD [facility]  Office phone [phone] Fax no. 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 Patient was previously seen by Dr Linus Salmons for progressive back pain for 2 months and discitis in T8-T9 level with abnormal diffuse marrow signal. Denies any prior surgeries in the back or presence of hardware. Denies any other neurological symptoms. Underwent Thoracic disc aspirate on 03/31/20 with NG in aerobic/anaerobic cx, fungal cx and AFB cx, no organisms on gram stain and AFB stain. Patient was started empirically on vancomycin and ceftriaxone for possible infective discitis. He is planned for an 8 week course which he going to be completed on 05/25/20. Inflammatory markers are trending down. Of note, patient was recently admitted to the hospital for vertigo and which was symptomatically managed  with meclizine/vestibular rehab. He says vertigo has much improved with the meds and vestibular therapy. Denies hearing loss.   Patient denies any side effects with the antibiotics. Back pain has improved from a severity of 10/10 to 2-3/10. He definitely feels better and improved with the IV abx. He is thankful to the team for his care. No issues with his PICC line. Explained to him that PICC line needs to come out after end date of abx on 9/13. He will continue to follow pain management for his back pain as well  ROS: Constitutional: Negative for fever, chills, activity change, appetite change, fatigue and unexpected weight change.  HENT: Negative for congestion, sore throat, rhinorrhea, sneezing, trouble swallowing and sinus pressure.  Eyes: Negative for photophobia and visual disturbance.  Respiratory: Negative for cough, chest tightness, shortness of  breath, wheezing and stridor.  Cardiovascular: Negative for chest pain, palpitations and leg swelling.  Gastrointestinal: Negative for nausea, vomiting, abdominal pain, diarrhea, constipation, blood in stool, abdominal distention and anal bleeding.  Genitourinary: positive for dysuria, hematuria, flank pain and difficulty urinating.  Musculoskeletal: Negative for myalgias joint swelling, arthralgias and gait problem.  Skin: Negative for color change, pallor, rash and wound.  Neurological: Negative for dizziness, tremors, weakness and light-headedness.  Hematological: Negative for adenopathy. Does not bruise/bleed easily.  Psychiatric/Behavioral: Negative for behavioral problems, confusion, sleep disturbance, dysphoric mood, decreased concentration and agitation.   Past Medical History:  Diagnosis Date  . At risk for sleep apnea    STOP-BANG= 6         SENT TO PCP 06-24-2016  . Back pain   . History of thyroid nodule    s/p  right thyroid lopectomy--  per path report:  follicular adenoma, chronic thyroiditis  . Hyperlipidemia   .  Hypertension   . Hypogonadism male   . Hypothyroidism   . Obesity   . Other specified disorders of tendon, right knee    quad tendon tear  . SOB (shortness of breath)   . Vitamin D deficiency   . Wears glasses    Past Surgical History:  Procedure Laterality Date  . COLONOSCOPY  2006  . EYE SURGERY Right age 31   removal glass  . HYPOSPADIAS CORRECTION  age 43  . IR CERVICAL/THORACIC DISC ASPIRATION W/IMAG GUIDE  03/31/2020      . IR CERVICAL/THORACIC DISC ASPIRATION W/IMAG GUIDE  03/31/2020  . QUADRICEPS TENDON REPAIR Left 06/28/2016   Procedure: LEFT KNEE REPAIR QUADRICEP TENDON;  Surgeon: Sydnee Cabal, MD;  Location: [facility];  Service: Orthopedics;  Laterality: Left;  . THYROID LOBECTOMY Right 02/03/2005   Current Outpatient Medications on File Prior to Visit  Medication Sig Dispense Refill  . acetaminophen-codeine (TYLENOL #3) 300-30 MG tablet Take 1 tablet by mouth 4 (four) times daily as needed for severe pain.     Marland Kitchen amLODipine (NORVASC) 5 MG tablet Take 1 tablet (5 mg total) by mouth daily. 90 tablet 3  . Ascorbic Acid (VITAMIN C WITH ROSE HIPS) 1000 MG tablet Take 5,000 mg by mouth daily.    Marland Kitchen aspirin EC 81 MG tablet Take 81 mg by mouth daily. Swallow whole.    . baclofen (LIORESAL) 20 MG tablet Take 0.5 tablets (10 mg total) by mouth 3 (three) times daily as needed for muscle spasms. 30 each 0  . benazepril (LOTENSIN) 20 MG tablet Take 1 tablet (20 mg total) by mouth daily. 90 tablet 3  . cefTRIAXone (ROCEPHIN) IVPB Inject 2 g into the vein daily. Infuse IV over 10 minutes every 24 hours    . Cholecalciferol (VITAMIN D3) 50 MCG (2000 UT) TABS Take 2,000 Units by mouth daily.     . Coenzyme Q10 (COQ10) 100 MG CAPS Take 1 capsule by mouth every morning.    . Cyanocobalamin (B-12) 2000 MCG TABS Take 1 tablet by mouth every morning.    . Echinacea 125 MG CAPS Take by mouth.    Marland Kitchen ibuprofen (ADVIL) 200 MG tablet Take 800 mg by mouth 3 (three) times daily.    Marland Kitchen  levothyroxine (SYNTHROID) 125 MCG tablet TAKE TWO TABLETS BY MOUTH ONCE DAILY BEFORE BREAKFAST. PT NEEDS OV FOR FUTURE REFILLS (Patient taking differently: Take 250 mcg by mouth daily before breakfast. ) 180 tablet 0  . lovastatin (MEVACOR) 40 MG tablet Take 1 tablet (40  mg total) by mouth at bedtime. Pt need appt for future refills 90 tablet 0  . meclizine (ANTIVERT) 25 MG tablet Take 1 tablet (25 mg total) by mouth 3 (three) times daily as needed for dizziness. 30 tablet 0  . Multiple Vitamin (MULTIVITAMIN) tablet Take 1 tablet by mouth every morning.    . triamterene-hydrochlorothiazide (MAXZIDE-25) 37.5-25 MG tablet TAKE 1 TABLET BY MOUTH ONCE DAILY. PT NEEDS APPT FOR FUTURE REFILLS (Patient taking differently: Take 1 tablet by mouth daily. ) 90 tablet 0  . Turmeric 500 MG TABS Take 1 capsule by mouth every morning.    . vancomycin IVPB Inject into the vein See admin instructions. 1750/236m NS HP, Infuse IV over 85 minutes every 24 hours    . oxyCODONE-acetaminophen (PERCOCET) 10-325 MG tablet Take 1 tablet by mouth at bedtime.  (Patient not taking: Reported on 05/20/2020)     No current facility-administered medications on file prior to visit.   Allergies  Allergen Reactions  . Penicillins     Family history of allergic reaction   . Watermelon [Citrullus Vulgaris]     Makes throat itch    Social History   Socioeconomic History  . Marital status: Divorced    Spouse name: Not on file  . Number of children: Not on file  . Years of education: Not on file  . Highest education level: Not on file  Occupational History  . Occupation: tPharmacist, hospital Tobacco Use  . Smoking status: Never Smoker  . Smokeless tobacco: Never Used  Vaping Use  . Vaping Use: Never used  Substance and Sexual Activity  . Alcohol use: Not Currently    Alcohol/week: 1.0 standard drink    Types: 1 Cans of beer per week    Comment: occasional  . Drug use: No  . Sexual activity: Not on file  Other Topics Concern  .  Not on file  Social History Narrative  . Not on file   Social Determinants of Health   Financial Resource Strain:   . Difficulty of Paying Living Expenses: Not on file  Food Insecurity:   . Worried About RCharity fundraiserin the Last Year: Not on file  . Ran Out of Food in the Last Year: Not on file  Transportation Needs:   . Lack of Transportation (Medical): Not on file  . Lack of Transportation (Non-Medical): Not on file  Physical Activity:   . Days of Exercise per Week: Not on file  . Minutes of Exercise per Session: Not on file  Stress:   . Feeling of Stress : Not on file  Social Connections:   . Frequency of Communication with Friends and Family: Not on file  . Frequency of Social Gatherings with Friends and Family: Not on file  . Attends Religious Services: Not on file  . Active Member of Clubs or Organizations: Not on file  . Attends CArchivistMeetings: Not on file  . Marital Status: Not on file  Intimate Partner Violence:   . Fear of Current or Ex-Partner: Not on file  . Emotionally Abused: Not on file  . Physically Abused: Not on file  . Sexually Abused: Not on file     Vitals BP (!) 162/83   Pulse 74   Temp 98.6 F (37 C) (Oral)   Ht 6' (1.829 m)   Wt (!) 337 lb (152.9 kg)   SpO2 97%   BMI 45.71 kg/m    Examination  General - not in acute  distress, comfortably sitting in chair, obese  HEENT - PEERLA, no pallor and no icterus Chest - b/l clear air entry, no additional sounds CVS- Normal s1s2, RRR Abdomen - Soft, Non tender , non distended Ext- no pedal edema, Left arm PICC + - no erythema/tenderness Neuro: grossly normal Back - No vertebral tenderness Psych : calm and cooperative   Recent labs 05/07/20 Cr 1.31 05/11/20  Cr 1.19, Vanc trough 6.1 CBC Latest Ref Rng & Units 04/28/2020 04/27/2020 04/26/2020  WBC 4.0 - 10.5 K/uL 4.9 5.2 -  Hemoglobin 13.0 - 17.0 g/dL 11.0(L) 10.5(L) 12.9(L)  Hematocrit 39 - 52 % 33.8(L) 33.1(L) 38.0(L)   Platelets 150 - 400 K/uL 244 248 -   CMP Latest Ref Rng & Units 04/28/2020 04/27/2020 04/26/2020  Glucose 70 - 99 mg/dL 91 98 129(H)  BUN 6 - 20 mg/dL 15 14 22(H)  Creatinine 0.61 - 1.24 mg/dL 1.04 1.01 1.00  Sodium 135 - 145 mmol/L 138 138 139  Potassium 3.5 - 5.1 mmol/L 4.0 3.5 4.3  Chloride 98 - 111 mmol/L 102 103 102  CO2 22 - 32 mmol/L 28 25 -  Calcium 8.9 - 10.3 mg/dL 8.8(L) 8.8(L) -  Total Protein 6.5 - 8.1 g/dL - 5.9(L) -  Total Bilirubin 0.3 - 1.2 mg/dL - 0.6 -  Alkaline Phos 38 - 126 U/L - 40 -  AST 15 - 41 U/L - 15 -  ALT 0 - 44 U/L - 20 -   Microbiology Results for orders placed or performed during the hospital encounter of 04/26/20  SARS Coronavirus 2 by RT PCR (hospital order, performed in [facility] hospital lab) Nasopharyngeal Nasopharyngeal Swab     Status: None   Collection Time: 04/27/20  1:05 AM   Specimen: Nasopharyngeal Swab  Result Value Ref Range Status   SARS Coronavirus 2 NEGATIVE NEGATIVE Final    Comment: (NOTE) SARS-CoV-2 target nucleic acids are NOT DETECTED.  The SARS-CoV-2 RNA is generally detectable in upper and lower respiratory specimens during the acute phase of infection. The lowest concentration of SARS-CoV-2 viral copies this assay can detect is 250 copies / mL. A negative result does not preclude SARS-CoV-2 infection and should not be used as the sole basis for treatment or other patient management decisions.  A negative result may occur with improper specimen collection / handling, submission of specimen other than nasopharyngeal swab, presence of viral mutation(s) within the areas targeted by this assay, and inadequate number of viral copies (<250 copies / mL). A negative result must be combined with clinical observations, patient history, and epidemiological information.  Fact Sheet for Patients:   StrictlyIdeas.no  Fact Sheet for Healthcare Providers: BankingDealers.co.za  This test  is not yet approved or  cleared by the [place] FDA and has been authorized for detection and/or diagnosis of SARS-CoV-2 by FDA under an Emergency Use Authorization (EUA).  This EUA will remain in effect (meaning this test can be used) for the duration of the COVID-19 declaration under Section 564(b)(1) of the Act, 21 U.S.C. section 360bbb-3(b)(1), unless the authorization is terminated or revoked sooner.  Performed at [facility], [place] [address] [place], [place] [address]      All pertinent labs/Imagings/notes reviewed. All pertinent plain films and CT images have been personally visualized and interpreted; radiology reports have been reviewed. Decision making incorporated into the Impression / Recommendations.

## 2020-05-20 NOTE — Telephone Encounter (Signed)
Spoke with Melissa at [facility], per Dr. Elinor Parkinson okay to pull PICC after last treatment on 05/25/20. Orders repeated and verified.   Sandie Ano, RN

## 2020-05-21 LAB — SEDIMENTATION RATE: Sed Rate: 6 mm/h (ref 0–20)

## 2020-05-21 LAB — C-REACTIVE PROTEIN: CRP: 3.2 mg/L (ref <8.0)

## 2020-06-22 MED ORDER — LOVASTATIN 40 MG PO TABS: 40.0000 mg | ORAL_TABLET | Freq: Every day | ORAL | 2 refills | Status: DC

## 2020-06-22 MED ORDER — LEVOTHYROXINE SODIUM 125 MCG PO TABS: ORAL_TABLET | ORAL | 2 refills | Status: DC

## 2020-06-22 MED ORDER — TRIAMTERENE-HCTZ 37.5-25 MG PO TABS: ORAL_TABLET | ORAL | 2 refills | Status: DC

## 2020-07-02 ENCOUNTER — Encounter: Payer: Self-pay | Admitting: Internal Medicine

## 2020-07-02 ENCOUNTER — Other Ambulatory Visit: Payer: Self-pay

## 2020-07-02 ENCOUNTER — Ambulatory Visit (INDEPENDENT_AMBULATORY_CARE_PROVIDER_SITE_OTHER): Payer: BC Managed Care – PPO | Admitting: Internal Medicine

## 2020-07-02 VITALS — BP 142/72 | HR 75 | Temp 98.7°F | Resp 16 | Ht 72.0 in | Wt 333.0 lb

## 2020-07-02 DIAGNOSIS — N410 Acute prostatitis: Secondary | ICD-10-CM | POA: Diagnosis not present

## 2020-07-02 DIAGNOSIS — N5082 Scrotal pain: Secondary | ICD-10-CM | POA: Diagnosis not present

## 2020-07-02 LAB — POCT URINALYSIS DIPSTICK
Bilirubin, UA: NEGATIVE
Blood, UA: NEGATIVE
Glucose, UA: NEGATIVE
Ketones, UA: NEGATIVE
Leukocytes, UA: NEGATIVE
Nitrite, UA: NEGATIVE
Protein, UA: NEGATIVE
Spec Grav, UA: >=1.03 — AB (ref 1.010–1.025)
Urobilinogen, UA: 0.2 E.U./dL
pH, UA: 6 (ref 5.0–8.0)

## 2020-07-02 MED ORDER — SULFAMETHOXAZOLE-TRIMETHOPRIM 800-160 MG PO TABS: 1.0000 | ORAL_TABLET | Freq: Two times a day (BID) | ORAL | 0 refills | Status: AC

## 2020-07-02 NOTE — Progress Notes (Signed)
Subjective:  Patient ID: Randy [name], male    DOB: [birth year]  Age: 59 y.o. MRN: [id number]  CC: Testicle Pain  This visit occurred during the SARS-CoV-2 public health emergency.  Safety protocols were in place, including screening questions prior to the visit, additional usage of staff PPE, and extensive cleaning of exam room while observing appropriate contact time as indicated for disinfecting solutions.    HPI Randy [name] presents for a 2-week history of vague discomfort in his perineum, scrotum, and penis.  He denies dysuria, hematuria, urinary hesitancy, urinary frequency, swelling, rash, or lymphadenopathy.  Outpatient Medications Prior to Visit  Medication Sig Dispense Refill   acetaminophen-codeine (TYLENOL #3) 300-30 MG tablet Take 1 tablet by mouth 4 (four) times daily as needed for severe pain.      amLODipine (NORVASC) 5 MG tablet Take 1 tablet (5 mg total) by mouth daily. 90 tablet 3   Ascorbic Acid (VITAMIN C WITH ROSE HIPS) 1000 MG tablet Take 5,000 mg by mouth daily.     aspirin EC 81 MG tablet Take 81 mg by mouth daily. Swallow whole.     baclofen (LIORESAL) 20 MG tablet Take 0.5 tablets (10 mg total) by mouth 3 (three) times daily as needed for muscle spasms. 30 each 0   benazepril (LOTENSIN) 20 MG tablet Take 1 tablet (20 mg total) by mouth daily. 90 tablet 3   Cholecalciferol (VITAMIN D3) 50 MCG (2000 UT) TABS Take 2,000 Units by mouth daily.      Coenzyme Q10 (COQ10) 100 MG CAPS Take 1 capsule by mouth every morning.     Cyanocobalamin (B-12) 2000 MCG TABS Take 1 tablet by mouth every morning.     Echinacea 125 MG CAPS Take by mouth.     ibuprofen (ADVIL) 200 MG tablet Take 800 mg by mouth 3 (three) times daily.     levothyroxine (SYNTHROID) 125 MCG tablet TAKE TWO TABLETS BY MOUTH ONCE DAILY BEFORE BREAKFAST. 180 tablet 2   lovastatin (MEVACOR) 40 MG tablet Take 1 tablet (40 mg total) by mouth at bedtime. 90 tablet 2   meclizine (ANTIVERT) 25 MG  tablet Take 1 tablet (25 mg total) by mouth 3 (three) times daily as needed for dizziness. 30 tablet 0   Multiple Vitamin (MULTIVITAMIN) tablet Take 1 tablet by mouth every morning.     triamterene-hydrochlorothiazide (MAXZIDE-25) 37.5-25 MG tablet TAKE 1 TABLET BY MOUTH ONCE DAILY. 90 tablet 2   Turmeric 500 MG TABS Take 1 capsule by mouth every morning.     cefTRIAXone (ROCEPHIN) IVPB Inject 2 g into the vein daily. Infuse IV over 10 minutes every 24 hours     oxyCODONE-acetaminophen (PERCOCET) 10-325 MG tablet Take 1 tablet by mouth at bedtime.  (Patient not taking: Reported on 05/20/2020)     vancomycin IVPB Inject into the vein See admin instructions. 1750/252ml NS HP, Infuse IV over 85 minutes every 24 hours     No facility-administered medications prior to visit.    ROS Review of Systems  Constitutional: Negative.  Negative for chills, diaphoresis, fatigue and fever.  HENT: Negative.  Negative for sore throat and trouble swallowing.   Respiratory: Negative for cough, chest tightness, shortness of breath and wheezing.   Cardiovascular: Negative for chest pain, palpitations and leg swelling.  Gastrointestinal: Negative for abdominal pain, constipation, diarrhea and nausea.  Endocrine: Negative.   Genitourinary: Positive for testicular pain. Negative for decreased urine volume, difficulty urinating, discharge, dysuria, flank pain, frequency, hematuria, penile pain, penile  swelling, scrotal swelling and urgency.  Musculoskeletal: Positive for back pain. Negative for arthralgias, myalgias and neck pain.  Skin: Negative for color change, pallor and rash.  Neurological: Negative.  Negative for dizziness.  Hematological: Negative for adenopathy. Does not bruise/bleed easily.  Psychiatric/Behavioral: Negative.     Objective:  BP (!) 152/82    Pulse 75    Temp 98.7 F (37.1 C) (Oral)    Resp 16    Ht 6' (1.829 m)    Wt (!) 333 lb (151 kg)    SpO2 98%    BMI 45.16 kg/m   BP Readings  from Last 3 Encounters:  07/02/20 (!) 152/82  05/20/20 (!) 162/83  04/30/20 128/84    Wt Readings from Last 3 Encounters:  07/02/20 (!) 333 lb (151 kg)  05/20/20 (!) 337 lb (152.9 kg)  04/30/20 (!) 349 lb (158.3 kg)    Physical Exam Vitals reviewed.  Constitutional:      General: He is not in acute distress.    Appearance: He is obese. He is not ill-appearing, toxic-appearing or diaphoretic.  HENT:     Nose: Nose normal.     Mouth/Throat:     Mouth: Mucous membranes are moist.  Eyes:     General: No scleral icterus.    Conjunctiva/sclera: Conjunctivae normal.  Cardiovascular:     Rate and Rhythm: Normal rate and regular rhythm.     Heart sounds: No murmur heard.   Abdominal:     General: Abdomen is protuberant. Bowel sounds are normal. There is no distension.     Palpations: Abdomen is soft. There is no hepatomegaly, splenomegaly or mass.     Tenderness: There is no abdominal tenderness.     Hernia: No hernia is present. There is no hernia in the left inguinal area or right inguinal area.  Genitourinary:    Pubic Area: No rash.      Penis: Normal and circumcised. No erythema, discharge, swelling or lesions.      Testes: Normal.        Right: Mass, tenderness or swelling not present.        Left: Mass, tenderness or swelling not present.     Epididymis:     Right: Normal. Not inflamed or enlarged. No mass or tenderness.     Left: Normal. Not inflamed or enlarged. No mass or tenderness.     Prostate: Enlarged and tender. No nodules present.     Rectum: Normal. Guaiac result negative. No mass, tenderness, anal fissure, external hemorrhoid or internal hemorrhoid. Normal anal tone.     Comments: The lateral aspect of the right lobe is boggy and mildly tender. Musculoskeletal:     Cervical back: Neck supple.  Lymphadenopathy:     Lower Body: No right inguinal adenopathy. No left inguinal adenopathy.  Neurological:     Mental Status: He is alert.     Lab Results    Component Value Date   WBC 4.9 04/28/2020   HGB 11.0 (L) 04/28/2020   HCT 33.8 (L) 04/28/2020   PLT 244 04/28/2020   GLUCOSE 91 04/28/2020   CHOL 142 02/20/2020   TRIG 78 02/20/2020   HDL 38 (L) 02/20/2020   LDLCALC 89 02/20/2020   ALT 20 04/27/2020   AST 15 04/27/2020   NA 138 04/28/2020   K 4.0 04/28/2020   CL 102 04/28/2020   CREATININE 1.04 04/28/2020   BUN 15 04/28/2020   CO2 28 04/28/2020   TSH 0.406 04/27/2020   PSA  0.65 09/27/2019   INR 1.0 04/26/2020   HGBA1C 5.8 (H) 02/20/2020    CT HEAD WO CONTRAST  Result Date: 04/26/2020 CLINICAL DATA:  Dizziness with nausea and vomiting beginning this morning. EXAM: CT HEAD WITHOUT CONTRAST TECHNIQUE: Contiguous axial images were obtained from the base of the skull through the vertex without intravenous contrast. COMPARISON:  None. FINDINGS: Brain: Ventricles, cisterns and other CSF spaces are normal. There is no mass, mass effect, shift of midline structures or acute hemorrhage. No evidence of acute infarction. Vascular: No hyperdense vessel or unexpected calcification. Skull: Normal. Negative for fracture or focal lesion. Sinuses/Orbits: Minimal opacification over the left frontal sinus and anterior ethmoid air cells. Paranasal sinuses are otherwise unremarkable. Orbits are normal. Other: None. IMPRESSION: 1. No acute findings. 2. Minimal chronic sinus inflammatory change. Electronically Signed   By: Elberta Fortis M.D.   On: 04/26/2020 16:45   MR BRAIN WO CONTRAST  Result Date: 04/26/2020 CLINICAL DATA:  Initial evaluation for acute vertigo. EXAM: MRI HEAD WITHOUT CONTRAST TECHNIQUE: Multiplanar, multiecho pulse sequences of the brain and surrounding structures were obtained without intravenous contrast. COMPARISON:  Prior head CT from earlier the same day. FINDINGS: Brain: Cerebral volume within normal limits for patient age. Few scattered punctate foci of T2/FLAIR hyperintensity noted involving the supratentorial cerebral white  matter, nonspecific, but felt to be within normal limits for age. No abnormal foci of restricted diffusion to suggest acute or subacute ischemia. Gray-white matter differentiation well maintained. No encephalomalacia to suggest chronic infarction. No foci of susceptibility artifact to suggest acute or chronic intracranial hemorrhage. No mass lesion, midline shift or mass effect. No hydrocephalus. No extra-axial fluid collection. Major dural sinuses are grossly patent. Pituitary gland and suprasellar region are normal. Midline structures intact and normal. Vascular: Major intracranial vascular flow voids well maintained and normal in appearance. Skull and upper cervical spine: Craniocervical junction normal. Visualized upper cervical spine within normal limits. Bone marrow signal intensity normal. No scalp soft tissue abnormality. Sinuses/Orbits: Globes and orbital soft tissues within normal limits. Scattered mucosal thickening noted within the frontoethmoidal sinuses. Paranasal sinuses are otherwise clear. No mastoid effusion. Inner ear structures normal. Other: None. IMPRESSION: Normal brain MRI for age. No acute intracranial abnormality identified. Electronically Signed   By: Rise Mu M.D.   On: 04/26/2020 21:46    Assessment & Plan:   Amarius was seen today for testicle pain.  Diagnoses and all orders for this visit:  Scrotal pain- His UA is normal.  The examination is concerning for acute bacterial prostatitis.  Will empirically treat with SMX/TMP.  If his pain does not resolve soon then will consider doing an MRI of the pelvis to search for other causes. -     POCT Urinalysis Dipstick  Acute prostatitis without hematuria -     sulfamethoxazole-trimethoprim (BACTRIM DS) 800-160 MG tablet; Take 1 tablet by mouth 2 (two) times daily.   I have discontinued Randy [name] "Stewart"'s oxyCODONE-acetaminophen, cefTRIAXone, and vancomycin. I am also having him start on  sulfamethoxazole-trimethoprim. Additionally, I am having him maintain his Turmeric, multivitamin, B-12, CoQ10, Vitamin D3, acetaminophen-codeine, aspirin EC, ibuprofen, vitamin C with rose hips, amLODipine, benazepril, baclofen, meclizine, Echinacea, levothyroxine, lovastatin, and triamterene-hydrochlorothiazide.  Meds ordered this encounter  Medications   sulfamethoxazole-trimethoprim (BACTRIM DS) 800-160 MG tablet    Sig: Take 1 tablet by mouth 2 (two) times daily.    Dispense:  60 tablet    Refill:  0     Follow-up: No follow-ups on file.  Scarlette Calico, MD

## 2020-07-02 NOTE — Patient Instructions (Addendum)

## 2020-08-04 ENCOUNTER — Encounter: Payer: Self-pay | Admitting: Internal Medicine

## 2020-08-04 ENCOUNTER — Ambulatory Visit (INDEPENDENT_AMBULATORY_CARE_PROVIDER_SITE_OTHER): Payer: BC Managed Care – PPO | Admitting: Internal Medicine

## 2020-08-04 ENCOUNTER — Other Ambulatory Visit: Payer: Self-pay

## 2020-08-04 VITALS — BP 144/76 | HR 73 | Temp 98.1°F | Wt 335.8 lb

## 2020-08-04 DIAGNOSIS — Z0000 Encounter for general adult medical examination without abnormal findings: Secondary | ICD-10-CM

## 2020-08-04 DIAGNOSIS — R739 Hyperglycemia, unspecified: Secondary | ICD-10-CM

## 2020-08-04 DIAGNOSIS — M4644 Discitis, unspecified, thoracic region: Secondary | ICD-10-CM

## 2020-08-04 DIAGNOSIS — H8112 Benign paroxysmal vertigo, left ear: Secondary | ICD-10-CM

## 2020-08-04 DIAGNOSIS — I10 Essential (primary) hypertension: Secondary | ICD-10-CM

## 2020-08-04 DIAGNOSIS — E039 Hypothyroidism, unspecified: Secondary | ICD-10-CM | POA: Diagnosis not present

## 2020-08-04 DIAGNOSIS — N5082 Scrotal pain: Secondary | ICD-10-CM

## 2020-08-04 DIAGNOSIS — M5441 Lumbago with sciatica, right side: Secondary | ICD-10-CM

## 2020-08-04 NOTE — Assessment & Plan Note (Signed)
Wt Readings from Last 3 Encounters:  08/04/20 (!) 335 lb 12.8 oz (152.3 kg)  07/02/20 (!) 333 lb (151 kg)  05/20/20 (!) 337 lb (152.9 kg)

## 2020-08-04 NOTE — Assessment & Plan Note (Signed)
IV abx stopped in Sept - 9/13 after 2 mo Better after PT

## 2020-08-04 NOTE — Patient Instructions (Signed)
Support underwear (nylon or blend fiber)

## 2020-08-04 NOTE — Assessment & Plan Note (Signed)
ENT ref w/Chris Ezzard Standing is pending

## 2020-08-04 NOTE — Assessment & Plan Note (Signed)
Chronic  Lotrel, Maxzide  BP Readings from Last 3 Encounters:  08/04/20 (!) 144/76  07/02/20 (!) 142/72  05/20/20 (!) 162/83

## 2020-08-04 NOTE — Assessment & Plan Note (Signed)
Support underwear Finished Bactrim x 30 d

## 2020-08-04 NOTE — Assessment & Plan Note (Signed)
TSH Levothroid 

## 2020-08-04 NOTE — Progress Notes (Signed)
Subjective:  Patient ID: Randy [name], male    DOB: 05-05-1961  Age: 59 y.o. MRN: [id number]  CC: Follow-up (3 MONTH F/U)   HPI Randy [name] presents for LBP - infection; recent prostatitis -much better after Bactrim DS x 30 d   Outpatient Medications Prior to Visit  Medication Sig Dispense Refill  . acetaminophen-codeine (TYLENOL #3) 300-30 MG tablet Take 1 tablet by mouth 4 (four) times daily as needed for severe pain.     Marland Kitchen amLODipine (NORVASC) 5 MG tablet Take 1 tablet (5 mg total) by mouth daily. 90 tablet 3  . Ascorbic Acid (VITAMIN C WITH ROSE HIPS) 1000 MG tablet Take 5,000 mg by mouth daily.    Marland Kitchen aspirin EC 81 MG tablet Take 81 mg by mouth daily. Swallow whole.    . baclofen (LIORESAL) 20 MG tablet Take 0.5 tablets (10 mg total) by mouth 3 (three) times daily as needed for muscle spasms. 30 each 0  . benazepril (LOTENSIN) 20 MG tablet Take 1 tablet (20 mg total) by mouth daily. 90 tablet 3  . Cholecalciferol (VITAMIN D3) 50 MCG (2000 UT) TABS Take 2,000 Units by mouth daily.     . Coenzyme Q10 (COQ10) 100 MG CAPS Take 1 capsule by mouth every morning.    . Cyanocobalamin (B-12) 2000 MCG TABS Take 1 tablet by mouth every morning.    . Echinacea 125 MG CAPS Take by mouth.    Marland Kitchen ibuprofen (ADVIL) 200 MG tablet Take 400 mg by mouth 3 (three) times daily.     Marland Kitchen levothyroxine (SYNTHROID) 125 MCG tablet TAKE TWO TABLETS BY MOUTH ONCE DAILY BEFORE BREAKFAST. 180 tablet 2  . lovastatin (MEVACOR) 40 MG tablet Take 1 tablet (40 mg total) by mouth at bedtime. 90 tablet 2  . meclizine (ANTIVERT) 25 MG tablet Take 1 tablet (25 mg total) by mouth 3 (three) times daily as needed for dizziness. 30 tablet 0  . Multiple Vitamin (MULTIVITAMIN) tablet Take 1 tablet by mouth every morning.    . triamterene-hydrochlorothiazide (MAXZIDE-25) 37.5-25 MG tablet TAKE 1 TABLET BY MOUTH ONCE DAILY. 90 tablet 2  . Turmeric 500 MG TABS Take 1 capsule by mouth every morning.     No facility-administered  medications prior to visit.    ROS: Review of Systems  Constitutional: Negative for appetite change, fatigue and unexpected weight change.  HENT: Negative for congestion, nosebleeds, sneezing, sore throat and trouble swallowing.   Eyes: Negative for itching and visual disturbance.  Respiratory: Negative for cough.   Cardiovascular: Negative for chest pain, palpitations and leg swelling.  Gastrointestinal: Negative for abdominal distention, blood in stool, diarrhea and nausea.  Genitourinary: Negative for frequency and hematuria.  Musculoskeletal: Positive for back pain. Negative for gait problem, joint swelling and neck pain.  Skin: Negative for rash.  Neurological: Negative for dizziness, tremors, speech difficulty and weakness.  Psychiatric/Behavioral: Negative for agitation, dysphoric mood and sleep disturbance. The patient is not nervous/anxious.     Objective:  BP (!) 144/76 (BP Location: Left Arm)   Pulse 73   Temp 98.1 F (36.7 C) (Oral)   Wt (!) 335 lb 12.8 oz (152.3 kg)   SpO2 97%   BMI 45.54 kg/m   BP Readings from Last 3 Encounters:  08/04/20 (!) 144/76  07/02/20 (!) 142/72  05/20/20 (!) 162/83    Wt Readings from Last 3 Encounters:  08/04/20 (!) 335 lb 12.8 oz (152.3 kg)  07/02/20 (!) 333 lb (151 kg)  05/20/20 Marland Kitchen)  337 lb (152.9 kg)    Physical Exam Constitutional:      General: He is not in acute distress.    Appearance: He is well-developed. He is obese. He is not ill-appearing.     Comments: NAD  Eyes:     Conjunctiva/sclera: Conjunctivae normal.     Pupils: Pupils are equal, round, and reactive to light.  Neck:     Thyroid: No thyromegaly.     Vascular: No JVD.  Cardiovascular:     Rate and Rhythm: Normal rate and regular rhythm.     Heart sounds: Normal heart sounds. No murmur heard.  No friction rub. No gallop.   Pulmonary:     Effort: Pulmonary effort is normal. No respiratory distress.     Breath sounds: Normal breath sounds. No wheezing or  rales.  Chest:     Chest wall: No tenderness.  Abdominal:     General: Bowel sounds are normal. There is no distension.     Palpations: Abdomen is soft. There is no mass.     Tenderness: There is no abdominal tenderness. There is no guarding or rebound.  Musculoskeletal:        General: No tenderness. Normal range of motion.     Cervical back: Normal range of motion.  Lymphadenopathy:     Cervical: No cervical adenopathy.  Skin:    General: Skin is warm and dry.     Findings: No rash.  Neurological:     Mental Status: He is alert and oriented to person, place, and time.     Cranial Nerves: No cranial nerve deficit.     Motor: No abnormal muscle tone.     Coordination: Coordination normal.     Gait: Gait normal.     Deep Tendon Reflexes: Reflexes are normal and symmetric.  Psychiatric:        Behavior: Behavior normal.        Thought Content: Thought content normal.        Judgment: Judgment normal.     Lab Results  Component Value Date   WBC 4.9 04/28/2020   HGB 11.0 (L) 04/28/2020   HCT 33.8 (L) 04/28/2020   PLT 244 04/28/2020   GLUCOSE 91 04/28/2020   CHOL 142 02/20/2020   TRIG 78 02/20/2020   HDL 38 (L) 02/20/2020   LDLCALC 89 02/20/2020   ALT 20 04/27/2020   AST 15 04/27/2020   NA 138 04/28/2020   K 4.0 04/28/2020   CL 102 04/28/2020   CREATININE 1.04 04/28/2020   BUN 15 04/28/2020   CO2 28 04/28/2020   TSH 0.406 04/27/2020   PSA 0.65 09/27/2019   INR 1.0 04/26/2020   HGBA1C 5.8 (H) 02/20/2020    CT HEAD WO CONTRAST  Result Date: 04/26/2020 CLINICAL DATA:  Dizziness with nausea and vomiting beginning this morning. EXAM: CT HEAD WITHOUT CONTRAST TECHNIQUE: Contiguous axial images were obtained from the base of the skull through the vertex without intravenous contrast. COMPARISON:  None. FINDINGS: Brain: Ventricles, cisterns and other CSF spaces are normal. There is no mass, mass effect, shift of midline structures or acute hemorrhage. No evidence of acute  infarction. Vascular: No hyperdense vessel or unexpected calcification. Skull: Normal. Negative for fracture or focal lesion. Sinuses/Orbits: Minimal opacification over the left frontal sinus and anterior ethmoid air cells. Paranasal sinuses are otherwise unremarkable. Orbits are normal. Other: None. IMPRESSION: 1. No acute findings. 2. Minimal chronic sinus inflammatory change. Electronically Signed   By: Elberta Fortis M.D.  On: 04/26/2020 16:45   MR BRAIN WO CONTRAST  Result Date: 04/26/2020 CLINICAL DATA:  Initial evaluation for acute vertigo. EXAM: MRI HEAD WITHOUT CONTRAST TECHNIQUE: Multiplanar, multiecho pulse sequences of the brain and surrounding structures were obtained without intravenous contrast. COMPARISON:  Prior head CT from earlier the same day. FINDINGS: Brain: Cerebral volume within normal limits for patient age. Few scattered punctate foci of T2/FLAIR hyperintensity noted involving the supratentorial cerebral white matter, nonspecific, but felt to be within normal limits for age. No abnormal foci of restricted diffusion to suggest acute or subacute ischemia. Gray-white matter differentiation well maintained. No encephalomalacia to suggest chronic infarction. No foci of susceptibility artifact to suggest acute or chronic intracranial hemorrhage. No mass lesion, midline shift or mass effect. No hydrocephalus. No extra-axial fluid collection. Major dural sinuses are grossly patent. Pituitary gland and suprasellar region are normal. Midline structures intact and normal. Vascular: Major intracranial vascular flow voids well maintained and normal in appearance. Skull and upper cervical spine: Craniocervical junction normal. Visualized upper cervical spine within normal limits. Bone marrow signal intensity normal. No scalp soft tissue abnormality. Sinuses/Orbits: Globes and orbital soft tissues within normal limits. Scattered mucosal thickening noted within the frontoethmoidal sinuses. Paranasal  sinuses are otherwise clear. No mastoid effusion. Inner ear structures normal. Other: None. IMPRESSION: Normal brain MRI for age. No acute intracranial abnormality identified. Electronically Signed   By: Rise Mu M.D.   On: 04/26/2020 21:46    Assessment & Plan:   There are no diagnoses linked to this encounter.   No orders of the defined types were placed in this encounter.    Follow-up: No follow-ups on file.  Sonda Primes, MD

## 2020-08-10 ENCOUNTER — Encounter (INDEPENDENT_AMBULATORY_CARE_PROVIDER_SITE_OTHER): Payer: Self-pay | Admitting: Otolaryngology

## 2020-08-10 ENCOUNTER — Ambulatory Visit (INDEPENDENT_AMBULATORY_CARE_PROVIDER_SITE_OTHER): Payer: BC Managed Care – PPO | Admitting: Otolaryngology

## 2020-08-10 ENCOUNTER — Other Ambulatory Visit: Payer: Self-pay

## 2020-08-10 VITALS — Temp 97.5°F

## 2020-08-10 DIAGNOSIS — H900 Conductive hearing loss, bilateral: Secondary | ICD-10-CM

## 2020-08-10 NOTE — Progress Notes (Signed)
HPI: Randy [name] is a 59 y.o. male who presents is referred by [facility] for evaluation of conductive hearing loss.  Patient has noticed gradual decline of his hearing over the past couple of years.  He had a hearing test performed at [facility] that showed conductive hearing loss in both ears slightly worse on the left side with bone scores between 15 and 20 DB and air scores in the right ear of approximately 35 DB and ear scores on the left side of approximately 40-45 DB.Marland Kitchen Patient states that when he is able to "pop" his ears he is able to hear a little bit better temporarily especially on the left side.  Past Medical History:  Diagnosis Date  . At risk for sleep apnea    STOP-BANG= 6         SENT TO PCP 06-24-2016  . Back pain   . History of thyroid nodule    s/p  right thyroid lopectomy--  per path report:  follicular adenoma, chronic thyroiditis  . Hyperlipidemia   . Hypertension   . Hypogonadism male   . Hypothyroidism   . Obesity   . Other specified disorders of tendon, right knee    quad tendon tear  . SOB (shortness of breath)   . Vitamin D deficiency   . Wears glasses    Past Surgical History:  Procedure Laterality Date  . COLONOSCOPY  2006  . EYE SURGERY Right age 15   removal glass  . HYPOSPADIAS CORRECTION  age 42  . IR CERVICAL/THORACIC DISC ASPIRATION W/IMAG GUIDE  03/31/2020      . IR CERVICAL/THORACIC DISC ASPIRATION W/IMAG GUIDE  03/31/2020  . QUADRICEPS TENDON REPAIR Left 06/28/2016   Procedure: LEFT KNEE REPAIR QUADRICEP TENDON;  Surgeon: Eugenia Mcalpine, MD;  Location: [facility];  Service: Orthopedics;  Laterality: Left;  . THYROID LOBECTOMY Right 02/03/2005   Social History   Socioeconomic History  . Marital status: Divorced    Spouse name: Not on file  . Number of children: Not on file  . Years of education: Not on file  . Highest education level: Not on file  Occupational History  . Occupation: Runner, broadcasting/film/video  Tobacco Use  .  Smoking status: Never Smoker  . Smokeless tobacco: Never Used  Vaping Use  . Vaping Use: Never used  Substance and Sexual Activity  . Alcohol use: Not Currently    Alcohol/week: 1.0 standard drink    Types: 1 Cans of beer per week    Comment: occasional  . Drug use: No  . Sexual activity: Not on file  Other Topics Concern  . Not on file  Social History Narrative  . Not on file   Social Determinants of Health   Financial Resource Strain:   . Difficulty of Paying Living Expenses: Not on file  Food Insecurity:   . Worried About Programme researcher, broadcasting/film/video in the Last Year: Not on file  . Ran Out of Food in the Last Year: Not on file  Transportation Needs:   . Lack of Transportation (Medical): Not on file  . Lack of Transportation (Non-Medical): Not on file  Physical Activity:   . Days of Exercise per Week: Not on file  . Minutes of Exercise per Session: Not on file  Stress:   . Feeling of Stress : Not on file  Social Connections:   . Frequency of Communication with Friends and Family: Not on file  . Frequency of Social Gatherings with Friends  and Family: Not on file  . Attends Religious Services: Not on file  . Active Member of Clubs or Organizations: Not on file  . Attends Banker Meetings: Not on file  . Marital Status: Not on file   Family History  Problem Relation Age of Onset  . Mental illness Mother        dementia  . Hypertension Mother   . Hyperlipidemia Mother   . Heart disease Mother   . Thyroid disease Mother   . Depression Mother   . Anxiety disorder Mother   . Schizophrenia Mother   . Arthritis Father   . COPD Father   . Hypertension Father   . Hyperlipidemia Father   . Heart disease Father   . Thyroid disease Father   . Sleep apnea Father   . Alcohol abuse Father   . Liver disease Father    Allergies  Allergen Reactions  . Penicillins     Family history of allergic reaction   . Watermelon [Citrullus Vulgaris]     Makes throat itch     Prior to Admission medications   Medication Sig Start Date End Date Taking? Authorizing Provider  acetaminophen-codeine (TYLENOL #3) 300-30 MG tablet Take 1 tablet by mouth 4 (four) times daily as needed for severe pain.  02/29/20  Yes [provider]  amLODipine (NORVASC) 5 MG tablet Take 1 tablet (5 mg total) by mouth daily. 03/25/20  Yes Plotnikov, Georgina Quint, MD  Ascorbic Acid (VITAMIN C WITH ROSE HIPS) 1000 MG tablet Take 5,000 mg by mouth daily.   Yes [provider]  aspirin EC 81 MG tablet Take 81 mg by mouth daily. Swallow whole.   Yes [provider]  baclofen (LIORESAL) 20 MG tablet Take 0.5 tablets (10 mg total) by mouth 3 (three) times daily as needed for muscle spasms. 04/28/20  Yes Dorcas Carrow, MD  benazepril (LOTENSIN) 20 MG tablet Take 1 tablet (20 mg total) by mouth daily. 03/27/20  Yes Plotnikov, Georgina Quint, MD  Cholecalciferol (VITAMIN D3) 50 MCG (2000 UT) TABS Take 2,000 Units by mouth daily.    Yes [provider]  Coenzyme Q10 (COQ10) 100 MG CAPS Take 1 capsule by mouth every morning.   Yes [provider]  Cyanocobalamin (B-12) 2000 MCG TABS Take 1 tablet by mouth every morning.   Yes [provider]  Echinacea 125 MG CAPS Take by mouth.   Yes [provider]  ibuprofen (ADVIL) 200 MG tablet Take 400 mg by mouth 3 (three) times daily.    Yes [provider]  levothyroxine (SYNTHROID) 125 MCG tablet TAKE TWO TABLETS BY MOUTH ONCE DAILY BEFORE BREAKFAST. 06/22/20  Yes Plotnikov, Georgina Quint, MD  lovastatin (MEVACOR) 40 MG tablet Take 1 tablet (40 mg total) by mouth at bedtime. 06/22/20  Yes Plotnikov, Georgina Quint, MD  meclizine (ANTIVERT) 25 MG tablet Take 1 tablet (25 mg total) by mouth 3 (three) times daily as needed for dizziness. 04/28/20  Yes Dorcas Carrow, MD  Multiple Vitamin (MULTIVITAMIN) tablet Take 1 tablet by mouth every morning.   Yes [provider]  triamterene-hydrochlorothiazide  (MAXZIDE-25) 37.5-25 MG tablet TAKE 1 TABLET BY MOUTH ONCE DAILY. 06/22/20  Yes Plotnikov, Georgina Quint, MD  Turmeric 500 MG TABS Take 1 capsule by mouth every morning.   Yes [provider]     Positive ROS: Otherwise negative  All other systems have been reviewed and were otherwise negative with the exception of those mentioned in the  HPI and as above.  Physical Exam: Constitutional: Alert, well-appearing, no acute distress Ears: External ears without lesions or tenderness.  Right ear canal was clear and right TM appeared clear with good mobility on pneumatic otoscopy.  Left TM still had a little bit of wax adjacent to the TM that was removed with suction and hydroperoxide.  The left TM was slightly retracted on pneumatic otoscopy.  Using phenol I performed a small myringotomy in the inferior portion of the left TM and vallecular space was dry.  On tuning fork testing with the 512 tuning fork Weber lateralized to the left side and BC >  AC bilaterally. Nasal: External nose without lesions. Septum with mild deformity and minimal rhinitis.. Clear nasal passages Oral: Lips and gums without lesions. Tongue and palate mucosa without lesions. Posterior oropharynx clear. Neck: No palpable adenopathy or masses Respiratory: Breathing comfortably  Skin: No facial/neck lesions or rash noted.  Myringotomy  Date/Time: 08/10/2020 5:24 PM Performed by: Drema Halon, MD Authorized by: Drema Halon, MD   Consent:    Consent obtained:  Verbal   Consent given by:  Patient   Risks discussed:  Bleeding and pain   Alternatives discussed:  No treatment Anesthesia:    Anesthesia method:  Topical application   Topical anesthetic:  Phenol Procedure Details:    Location:  Left TM   Hole made in:  Inferior aspect of TM   Tube:  None Post-procedure details:    Patient tolerance of procedure:  Tolerated well, no immediate complications Comments:     Small myringotomy was performed  on the left TM in the middle ear space was dry.    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  Plan: He would be a candidate for stapedectomy and briefly reviewed this with him.  I presently do not perform stapes surgery a longer and have referred him to Dr. Suszanne Conners.   Narda Bonds, MD   CC:

## 2020-12-01 ENCOUNTER — Other Ambulatory Visit: Payer: Self-pay

## 2020-12-02 ENCOUNTER — Encounter: Payer: Self-pay | Admitting: Internal Medicine

## 2020-12-02 ENCOUNTER — Other Ambulatory Visit: Payer: Self-pay

## 2020-12-02 ENCOUNTER — Ambulatory Visit: Payer: BC Managed Care – PPO | Admitting: Internal Medicine

## 2020-12-02 DIAGNOSIS — E039 Hypothyroidism, unspecified: Secondary | ICD-10-CM

## 2020-12-02 DIAGNOSIS — E785 Hyperlipidemia, unspecified: Secondary | ICD-10-CM | POA: Diagnosis not present

## 2020-12-02 DIAGNOSIS — M5441 Lumbago with sciatica, right side: Secondary | ICD-10-CM

## 2020-12-02 NOTE — Assessment & Plan Note (Signed)
Much better 

## 2020-12-02 NOTE — Progress Notes (Signed)
Subjective:  Patient ID: Randy [name], male    DOB: 1961-07-05  Age: 60 y.o. MRN: [id number]  CC: Follow-up (4 month f/u)   HPI Randy [name] presents for LBP/diskitis, HTN, dyslipidemia  Outpatient Medications Prior to Visit  Medication Sig Dispense Refill  . amLODipine (NORVASC) 5 MG tablet Take 1 tablet (5 mg total) by mouth daily. 90 tablet 3  . Ascorbic Acid (VITAMIN C WITH ROSE HIPS) 1000 MG tablet Take 5,000 mg by mouth daily.    Marland Kitchen aspirin EC 81 MG tablet Take 81 mg by mouth daily. Swallow whole.    . baclofen (LIORESAL) 20 MG tablet Take 0.5 tablets (10 mg total) by mouth 3 (three) times daily as needed for muscle spasms. 30 each 0  . benazepril (LOTENSIN) 20 MG tablet Take 1 tablet (20 mg total) by mouth daily. 90 tablet 3  . Cholecalciferol (VITAMIN D3) 50 MCG (2000 UT) TABS Take 2,000 Units by mouth daily.     . Coenzyme Q10 (COQ10) 100 MG CAPS Take 1 capsule by mouth every morning.    . Cyanocobalamin (B-12) 2000 MCG TABS Take 1 tablet by mouth every morning.    . Echinacea 125 MG CAPS Take by mouth.    Marland Kitchen ibuprofen (ADVIL) 200 MG tablet Take 400 mg by mouth 3 (three) times daily.     Marland Kitchen levothyroxine (SYNTHROID) 125 MCG tablet TAKE TWO TABLETS BY MOUTH ONCE DAILY BEFORE BREAKFAST. 180 tablet 2  . lovastatin (MEVACOR) 40 MG tablet Take 1 tablet (40 mg total) by mouth at bedtime. 90 tablet 2  . Multiple Vitamin (MULTIVITAMIN) tablet Take 1 tablet by mouth every morning.    . triamterene-hydrochlorothiazide (MAXZIDE-25) 37.5-25 MG tablet TAKE 1 TABLET BY MOUTH ONCE DAILY. 90 tablet 2  . Turmeric 500 MG TABS Take 1 capsule by mouth every morning.    Marland Kitchen acetaminophen-codeine (TYLENOL #3) 300-30 MG tablet Take 1 tablet by mouth 4 (four) times daily as needed for severe pain.  (Patient not taking: Reported on 12/02/2020)    . meclizine (ANTIVERT) 25 MG tablet Take 1 tablet (25 mg total) by mouth 3 (three) times daily as needed for dizziness. (Patient not taking: Reported on 12/02/2020)  30 tablet 0   No facility-administered medications prior to visit.    ROS: Review of Systems  Constitutional: Negative for appetite change, fatigue and unexpected weight change.  HENT: Negative for congestion, nosebleeds, sneezing, sore throat and trouble swallowing.   Eyes: Negative for itching and visual disturbance.  Respiratory: Negative for cough.   Cardiovascular: Negative for chest pain, palpitations and leg swelling.  Gastrointestinal: Negative for abdominal distention, blood in stool, diarrhea and nausea.  Genitourinary: Negative for frequency and hematuria.  Musculoskeletal: Positive for back pain. Negative for gait problem, joint swelling and neck pain.  Skin: Negative for rash.  Neurological: Negative for dizziness, tremors, speech difficulty and weakness.  Psychiatric/Behavioral: Negative for agitation, dysphoric mood and sleep disturbance. The patient is not nervous/anxious.     Objective:  BP 140/78 (BP Location: Left Arm)   Pulse 77   Temp 98.6 F (37 C) (Oral)   Ht 6' (1.829 m)   Wt (!) 336 lb 9.6 oz (152.7 kg)   SpO2 96%   BMI 45.65 kg/m   BP Readings from Last 3 Encounters:  12/02/20 140/78  08/04/20 (!) 144/76  07/02/20 (!) 142/72    Wt Readings from Last 3 Encounters:  12/02/20 (!) 336 lb 9.6 oz (152.7 kg)  08/04/20 (!) 335 lb  12.8 oz (152.3 kg)  07/02/20 (!) 333 lb (151 kg)    Physical Exam Constitutional:      General: He is not in acute distress.    Appearance: He is well-developed.     Comments: NAD  Eyes:     Conjunctiva/sclera: Conjunctivae normal.     Pupils: Pupils are equal, round, and reactive to light.  Neck:     Thyroid: No thyromegaly.     Vascular: No JVD.  Cardiovascular:     Rate and Rhythm: Normal rate and regular rhythm.     Heart sounds: Normal heart sounds. No murmur heard. No friction rub. No gallop.   Pulmonary:     Effort: Pulmonary effort is normal. No respiratory distress.     Breath sounds: Normal breath  sounds. No wheezing or rales.  Chest:     Chest wall: No tenderness.  Abdominal:     General: Bowel sounds are normal. There is no distension.     Palpations: Abdomen is soft. There is no mass.     Tenderness: There is no abdominal tenderness. There is no guarding or rebound.  Musculoskeletal:        General: No tenderness. Normal range of motion.     Cervical back: Normal range of motion.  Lymphadenopathy:     Cervical: No cervical adenopathy.  Skin:    General: Skin is warm and dry.     Findings: No rash.  Neurological:     Mental Status: He is alert and oriented to person, place, and time.     Cranial Nerves: No cranial nerve deficit.     Motor: No abnormal muscle tone.     Coordination: Coordination normal.     Gait: Gait normal.     Deep Tendon Reflexes: Reflexes are normal and symmetric.  Psychiatric:        Behavior: Behavior normal.        Thought Content: Thought content normal.        Judgment: Judgment normal.     Lab Results  Component Value Date   WBC 4.9 04/28/2020   HGB 11.0 (L) 04/28/2020   HCT 33.8 (L) 04/28/2020   PLT 244 04/28/2020   GLUCOSE 91 04/28/2020   CHOL 142 02/20/2020   TRIG 78 02/20/2020   HDL 38 (L) 02/20/2020   LDLCALC 89 02/20/2020   ALT 20 04/27/2020   AST 15 04/27/2020   NA 138 04/28/2020   K 4.0 04/28/2020   CL 102 04/28/2020   CREATININE 1.04 04/28/2020   BUN 15 04/28/2020   CO2 28 04/28/2020   TSH 0.406 04/27/2020   PSA 0.65 09/27/2019   INR 1.0 04/26/2020   HGBA1C 5.8 (H) 02/20/2020    CT HEAD WO CONTRAST  Result Date: 04/26/2020 CLINICAL DATA:  Dizziness with nausea and vomiting beginning this morning. EXAM: CT HEAD WITHOUT CONTRAST TECHNIQUE: Contiguous axial images were obtained from the base of the skull through the vertex without intravenous contrast. COMPARISON:  None. FINDINGS: Brain: Ventricles, cisterns and other CSF spaces are normal. There is no mass, mass effect, shift of midline structures or acute hemorrhage.  No evidence of acute infarction. Vascular: No hyperdense vessel or unexpected calcification. Skull: Normal. Negative for fracture or focal lesion. Sinuses/Orbits: Minimal opacification over the left frontal sinus and anterior ethmoid air cells. Paranasal sinuses are otherwise unremarkable. Orbits are normal. Other: None. IMPRESSION: 1. No acute findings. 2. Minimal chronic sinus inflammatory change. Electronically Signed   By: Elberta Fortis M.D.  On: 04/26/2020 16:45   MR BRAIN WO CONTRAST  Result Date: 04/26/2020 CLINICAL DATA:  Initial evaluation for acute vertigo. EXAM: MRI HEAD WITHOUT CONTRAST TECHNIQUE: Multiplanar, multiecho pulse sequences of the brain and surrounding structures were obtained without intravenous contrast. COMPARISON:  Prior head CT from earlier the same day. FINDINGS: Brain: Cerebral volume within normal limits for patient age. Few scattered punctate foci of T2/FLAIR hyperintensity noted involving the supratentorial cerebral white matter, nonspecific, but felt to be within normal limits for age. No abnormal foci of restricted diffusion to suggest acute or subacute ischemia. Gray-white matter differentiation well maintained. No encephalomalacia to suggest chronic infarction. No foci of susceptibility artifact to suggest acute or chronic intracranial hemorrhage. No mass lesion, midline shift or mass effect. No hydrocephalus. No extra-axial fluid collection. Major dural sinuses are grossly patent. Pituitary gland and suprasellar region are normal. Midline structures intact and normal. Vascular: Major intracranial vascular flow voids well maintained and normal in appearance. Skull and upper cervical spine: Craniocervical junction normal. Visualized upper cervical spine within normal limits. Bone marrow signal intensity normal. No scalp soft tissue abnormality. Sinuses/Orbits: Globes and orbital soft tissues within normal limits. Scattered mucosal thickening noted within the frontoethmoidal  sinuses. Paranasal sinuses are otherwise clear. No mastoid effusion. Inner ear structures normal. Other: None. IMPRESSION: Normal brain MRI for age. No acute intracranial abnormality identified. Electronically Signed   By: Rise Mu M.D.   On: 04/26/2020 21:46    Assessment & Plan:    Follow-up: No follow-ups on file.  Sonda Primes, MD

## 2020-12-02 NOTE — Assessment & Plan Note (Signed)
Wt Readings from Last 3 Encounters:  12/02/20 (!) 336 lb 9.6 oz (152.7 kg)  08/04/20 (!) 335 lb 12.8 oz (152.3 kg)  07/02/20 (!) 333 lb (151 kg)

## 2020-12-02 NOTE — Patient Instructions (Signed)
Cardiac CT calcium scoring test $99 Tel # is [phone]   Computed tomography, more commonly known as a CT or CAT scan, is a diagnostic medical imaging test. Like traditional x-rays, it produces multiple images or pictures of the inside of the body. The cross-sectional images generated during a CT scan can be reformatted in multiple planes. They can even generate three-dimensional images. These images can be viewed on a computer monitor, printed on film or by a 3D printer, or transferred to a CD or DVD. CT images of internal organs, bones, soft tissue and blood vessels provide greater detail than traditional x-rays, particularly of soft tissues and blood vessels. A cardiac CT scan for coronary calcium is a non-invasive way of obtaining information about the presence, location and extent of calcified plaque in the coronary arteries--the vessels that supply oxygen-containing blood to the heart muscle. Calcified plaque results when there is a build-up of fat and other substances under the inner layer of the artery. This material can calcify which signals the presence of atherosclerosis, a disease of the vessel wall, also called coronary artery disease (CAD). People with this disease have an increased risk for heart attacks. In addition, over time, progression of plaque build up (CAD) can narrow the arteries or even close off blood flow to the heart. The result may be chest pain, sometimes called "angina," or a heart attack. Because calcium is a marker of CAD, the amount of calcium detected on a cardiac CT scan is a helpful prognostic tool. The findings on cardiac CT are expressed as a calcium score. Another name for this test is coronary artery calcium scoring.  What are some common uses of the procedure? The goal of cardiac CT scan for calcium scoring is to determine if CAD is present and to what extent, even if there are no symptoms. It is a screening study that may be recommended by a physician for  patients with risk factors for CAD but no clinical symptoms. The major risk factors for CAD are: . high blood cholesterol levels  . family history of heart attacks  . diabetes  . high blood pressure  . cigarette smoking  . overweight or obese  . physical inactivity   A negative cardiac CT scan for calcium scoring shows no calcification within the coronary arteries. This suggests that CAD is absent or so minimal it cannot be seen by this technique. The chance of having a heart attack over the next two to five years is very low under these circumstances. A positive test means that CAD is present, regardless of whether or not the patient is experiencing any symptoms. The amount of calcification--expressed as the calcium score--may help to predict the likelihood of a myocardial infarction (heart attack) in the coming years and helps your medical doctor or cardiologist decide whether the patient may need to take preventive medicine or undertake other measures such as diet and exercise to lower the risk for heart attack. The extent of CAD is graded according to your calcium score:  Calcium Score  Presence of CAD (coronary artery disease)  0 No evidence of CAD   1-10 Minimal evidence of CAD  11-100 Mild evidence of CAD  101-400 Moderate evidence of CAD  Over 400 Extensive evidence of CAD    

## 2020-12-02 NOTE — Assessment & Plan Note (Signed)
A  cardiac CT scan for calcium scoring was offered 

## 2020-12-08 NOTE — Assessment & Plan Note (Signed)
Continue with thyroid.  Monitor TSH, FT4

## 2021-01-01 ENCOUNTER — Other Ambulatory Visit (INDEPENDENT_AMBULATORY_CARE_PROVIDER_SITE_OTHER): Payer: BC Managed Care – PPO

## 2021-01-01 DIAGNOSIS — Z0000 Encounter for general adult medical examination without abnormal findings: Secondary | ICD-10-CM | POA: Diagnosis not present

## 2021-01-01 DIAGNOSIS — R739 Hyperglycemia, unspecified: Secondary | ICD-10-CM | POA: Diagnosis not present

## 2021-01-01 DIAGNOSIS — Z125 Encounter for screening for malignant neoplasm of prostate: Secondary | ICD-10-CM

## 2021-01-01 LAB — URINALYSIS
Bilirubin Urine: NEGATIVE
Hgb urine dipstick: NEGATIVE
Ketones, ur: NEGATIVE
Leukocytes,Ua: NEGATIVE
Nitrite: NEGATIVE
Specific Gravity, Urine: 1.01 (ref 1.000–1.030)
Total Protein, Urine: NEGATIVE
Urine Glucose: NEGATIVE
Urobilinogen, UA: 0.2 (ref 0.0–1.0)
pH: 7 (ref 5.0–8.0)

## 2021-01-01 LAB — CBC WITH DIFFERENTIAL/PLATELET
Basophils Absolute: 0.1 10*3/uL (ref 0.0–0.1)
Basophils Relative: 1.3 % (ref 0.0–3.0)
Eosinophils Absolute: 0.2 10*3/uL (ref 0.0–0.7)
Eosinophils Relative: 4.4 % (ref 0.0–5.0)
HCT: 42 % (ref 39.0–52.0)
Hemoglobin: 14.2 g/dL (ref 13.0–17.0)
Lymphocytes Relative: 27.6 % (ref 12.0–46.0)
Lymphs Abs: 1.3 10*3/uL (ref 0.7–4.0)
MCHC: 33.8 g/dL (ref 30.0–36.0)
MCV: 87.5 fl (ref 78.0–100.0)
Monocytes Absolute: 0.5 10*3/uL (ref 0.1–1.0)
Monocytes Relative: 11.8 % (ref 3.0–12.0)
Neutro Abs: 2.5 10*3/uL (ref 1.4–7.7)
Neutrophils Relative %: 54.9 % (ref 43.0–77.0)
Platelets: 237 10*3/uL (ref 150.0–400.0)
RBC: 4.8 Mil/uL (ref 4.22–5.81)
RDW: 13.1 % (ref 11.5–15.5)
WBC: 4.6 10*3/uL (ref 4.0–10.5)

## 2021-01-01 LAB — LIPID PANEL
Cholesterol: 112 mg/dL (ref 0–200)
HDL: 35.6 mg/dL — ABNORMAL LOW (ref >39.00)
LDL Cholesterol: 67 mg/dL (ref 0–99)
NonHDL: 76.78
Total CHOL/HDL Ratio: 3
Triglycerides: 48 mg/dL (ref 0.0–149.0)
VLDL: 9.6 mg/dL (ref 0.0–40.0)

## 2021-01-01 LAB — TSH: TSH: 0.81 u[IU]/mL (ref 0.35–4.50)

## 2021-01-01 LAB — COMPREHENSIVE METABOLIC PANEL
ALT: 26 U/L (ref 0–53)
AST: 19 U/L (ref 0–37)
Albumin: 3.9 g/dL (ref 3.5–5.2)
Alkaline Phosphatase: 44 U/L (ref 39–117)
BUN: 18 mg/dL (ref 6–23)
CO2: 27 mEq/L (ref 19–32)
Calcium: 9.1 mg/dL (ref 8.4–10.5)
Chloride: 103 mEq/L (ref 96–112)
Creatinine, Ser: 1.13 mg/dL (ref 0.40–1.50)
GFR: 70.46 mL/min (ref >60.00)
Glucose, Bld: 96 mg/dL (ref 70–99)
Potassium: 4 mEq/L (ref 3.5–5.1)
Sodium: 139 mEq/L (ref 135–145)
Total Bilirubin: 0.6 mg/dL (ref 0.2–1.2)
Total Protein: 6.3 g/dL (ref 6.0–8.3)

## 2021-01-01 LAB — PSA: PSA: 0.8 ng/mL (ref 0.10–4.00)

## 2021-01-01 LAB — HEMOGLOBIN A1C: Hgb A1c MFr Bld: 5.7 % (ref 4.6–6.5)

## 2021-02-10 IMAGING — MR MR HEAD W/O CM
12 of 13 series · 44 of 48 positions shown · non-contrast
Comparison: Prior head CT from earlier the same day.

CLINICAL DATA: Initial evaluation for acute vertigo.

EXAM:
MRI HEAD WITHOUT CONTRAST
TECHNIQUE: Multiplanar, multiecho pulse sequences of the brain and surrounding
structures were obtained without intravenous contrast.

[Series 5: DWI · axial · 3.0mm · 0.88mm/px · z∈[-63,+90]mm · 8 of 104 slices shown (1 of 4)]
[im 1/104]
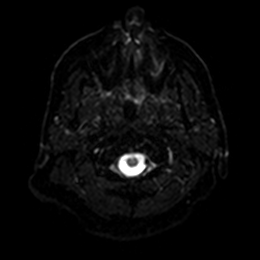
[im 15/104]
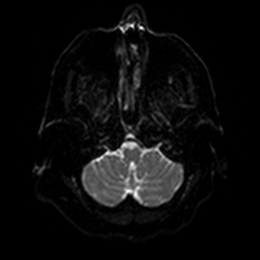
[im 30/104]
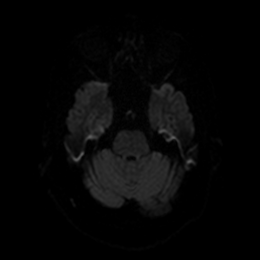
[im 45/104]
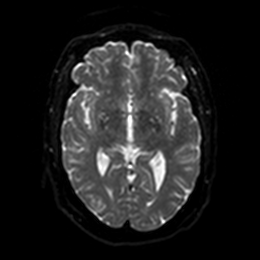
[im 59/104]
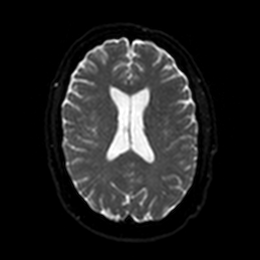
[im 74/104]
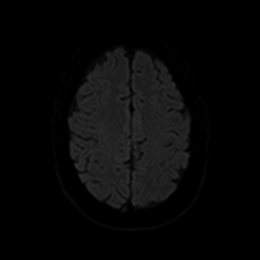
[im 89/104]
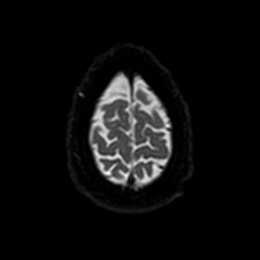
[im 104/104]
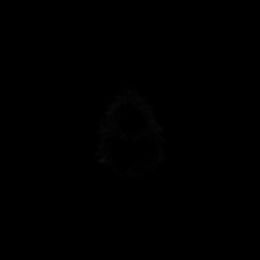

[Series 6: DWI · axial · 3.0mm · 0.88mm/px · z∈[-63,+90]mm · 4 of 52 slices shown (2 of 4)]
[im 1/52]
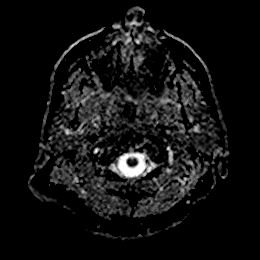
[im 18/52]
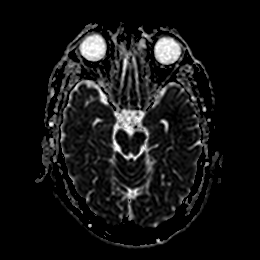
[im 35/52]
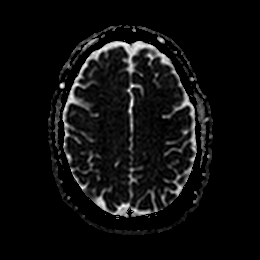
[im 52/52]
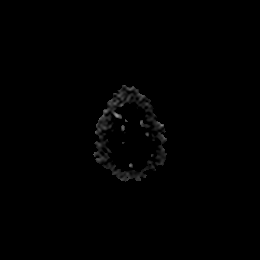

[Series 7: DWI · coronal · 4.0mm · 0.88mm/px · 5 of 68 slices shown (3 of 4)]
[im 1/68]
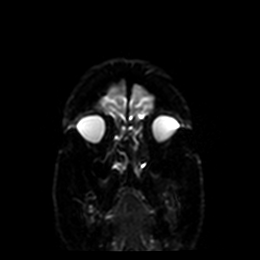
[im 17/68]
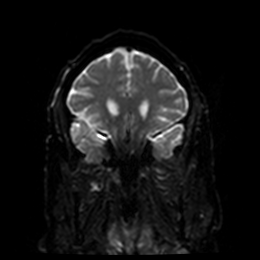
[im 34/68]
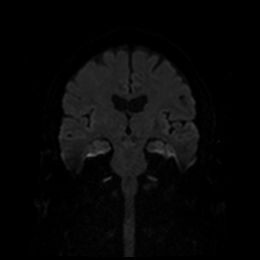
[im 51/68]
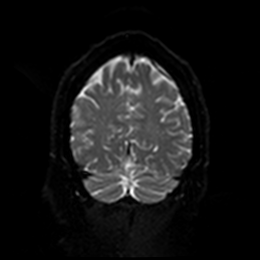
[im 68/68]
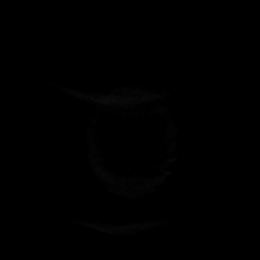

[Series 8: DWI · coronal · 4.0mm · 0.88mm/px · 3 of 34 slices shown (4 of 4)]
[im 1/34]
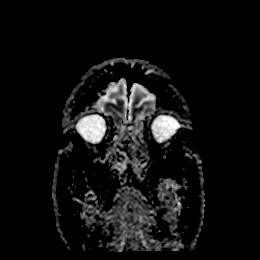
[im 17/34]
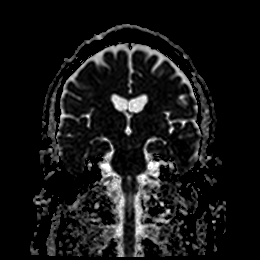
[im 34/34]
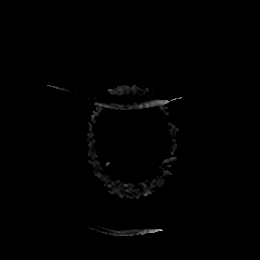

[Series 9: T1 · sagittal · 5.0mm · 0.75mm/px · 2 of 25 slices shown]
[im 1/25]
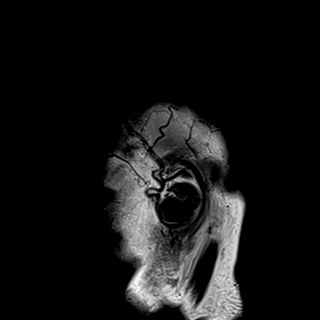
[im 25/25]
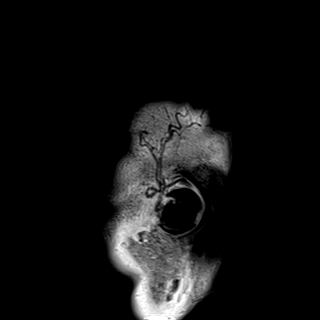

[Series 10: T2 · axial · 5.0mm · 0.72mm/px · z∈[-62,+93]mm · 2 of 27 slices shown (1 of 2)]
[im 1/27]
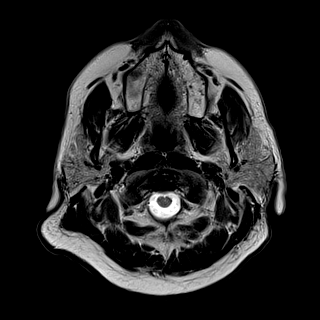
[im 27/27]
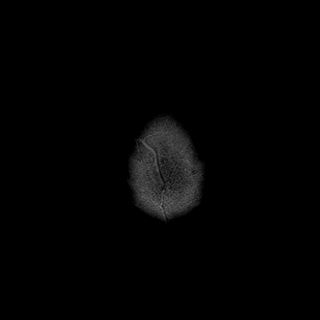

[Series 12: mag_images · axial · 3.0mm · 0.90mm/px · z∈[-67,+98]mm · 4 of 56 slices shown]
[im 1/56]
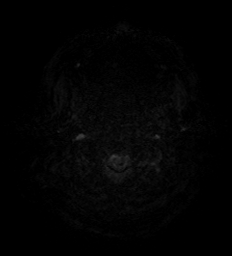
[im 19/56]
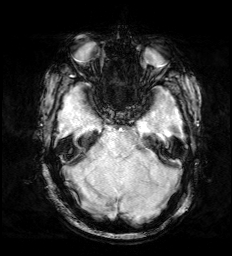
[im 37/56]
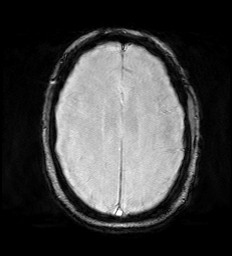
[im 56/56]
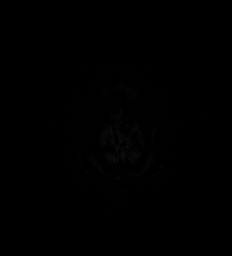

[Series 13: pha_images · axial · 3.0mm · 0.90mm/px · z∈[-67,+98]mm · 4 of 55 slices shown]
[im 1/55]
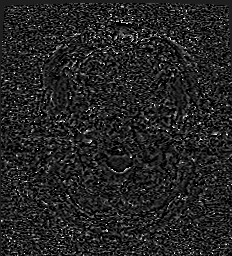
[im 19/55]
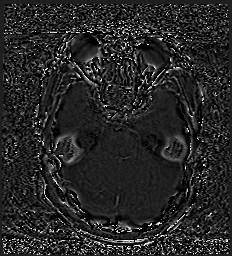
[im 37/55]
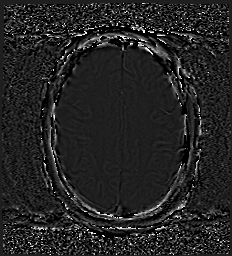
[im 55/55]
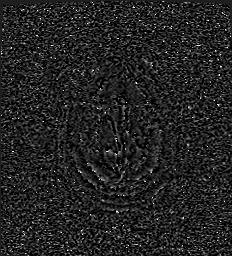

[Series 14: swi_images · axial · 3.0mm · 0.90mm/px · z∈[-67,+98]mm · 4 of 56 slices shown]
[im 1/56]
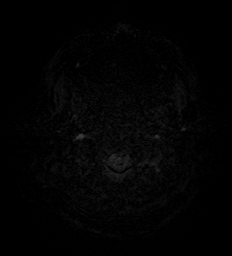
[im 19/56]
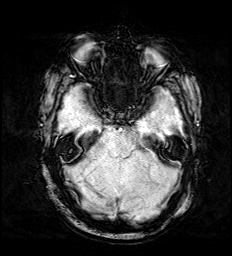
[im 37/56]
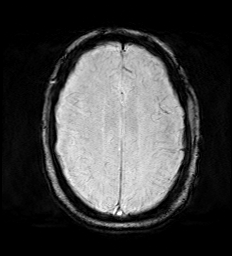
[im 56/56]
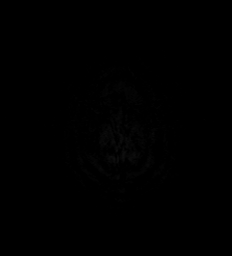

[Series 15: mip_images(sw) · axial · 24.0mm · 0.90mm/px · z∈[-56,+87]mm · 4 of 49 slices shown]
[im 1/49]
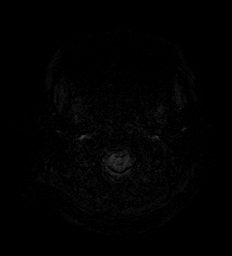
[im 17/49]
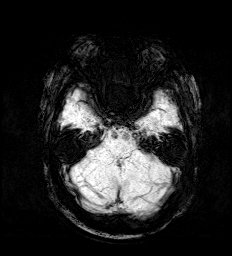
[im 33/49]
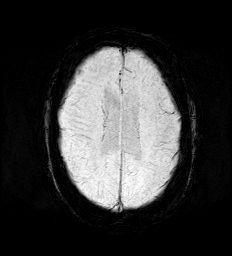
[im 49/49]
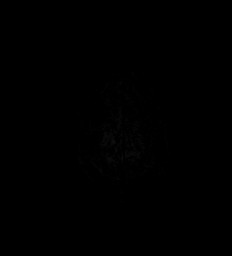

[Series 17: T2 · coronal · 5.0mm · 0.34mm/px · 2 of 30 slices shown (2 of 2)]
[im 1/30]
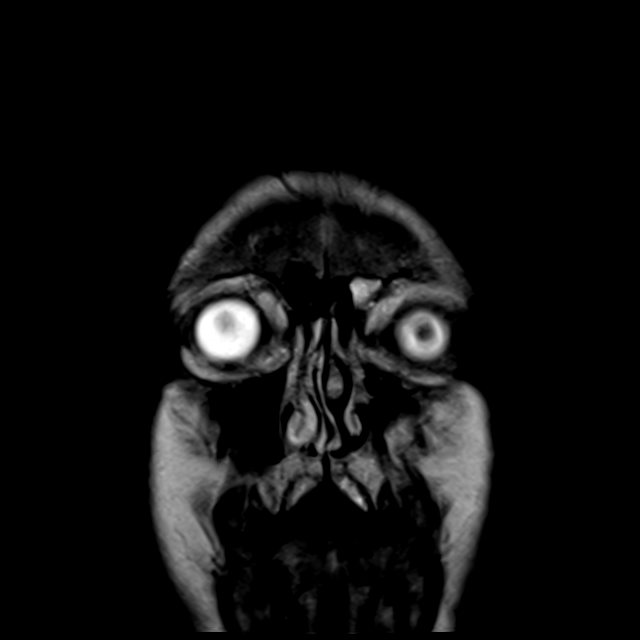
[im 30/30]
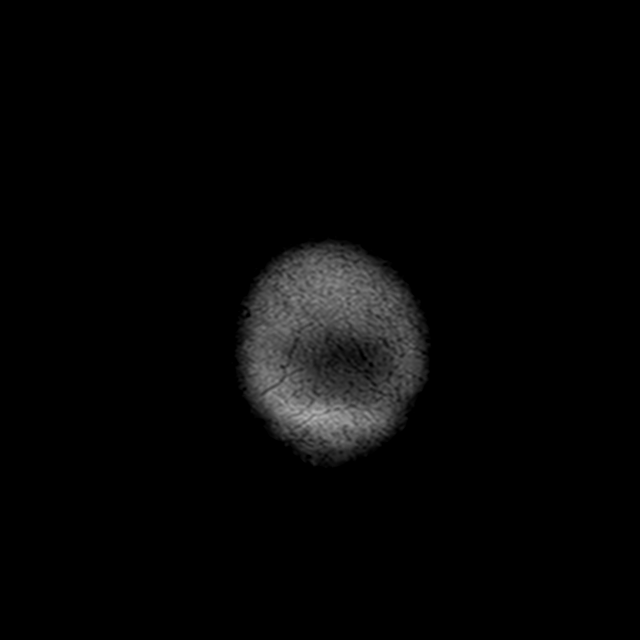

[Series 18: FLAIR · axial · 5.0mm · 0.90mm/px · z∈[-62,+93]mm · 2 of 27 slices shown]
[im 1/27]
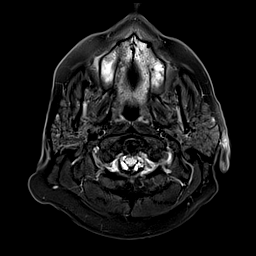
[im 27/27]
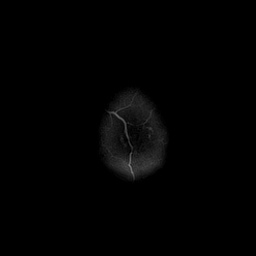

[44 of 48 positions shown; findings below may reference images not displayed]

FINDINGS: Brain: Cerebral volume within normal limits for patient age. Few
scattered punctate foci of T2/FLAIR hyperintensity noted involving
the supratentorial cerebral white matter, nonspecific, but felt to
be within normal limits for age.

No abnormal foci of restricted diffusion to suggest acute or
subacute ischemia. Gray-white matter differentiation well
maintained. No encephalomalacia to suggest chronic infarction. No
foci of susceptibility artifact to suggest acute or chronic
intracranial hemorrhage.

No mass lesion, midline shift or mass effect. No hydrocephalus. No
extra-axial fluid collection. Major dural sinuses are grossly
patent.

Pituitary gland and suprasellar region are normal. Midline
structures intact and normal.

Vascular: Major intracranial vascular flow voids well maintained and
normal in appearance.

Skull and upper cervical spine: Craniocervical junction normal.
Visualized upper cervical spine within normal limits. Bone marrow
signal intensity normal. No scalp soft tissue abnormality.

Sinuses/Orbits: Globes and orbital soft tissues within normal
limits.

Scattered mucosal thickening noted within the frontoethmoidal
sinuses. Paranasal sinuses are otherwise clear. No mastoid effusion.
Inner ear structures normal.

Other: None.
IMPRESSION: Normal brain MRI for age. No acute intracranial abnormality
identified.

## 2021-04-02 ENCOUNTER — Other Ambulatory Visit: Payer: Self-pay | Admitting: Internal Medicine

## 2021-04-07 ENCOUNTER — Other Ambulatory Visit: Payer: Self-pay | Admitting: Internal Medicine

## 2021-06-09 ENCOUNTER — Ambulatory Visit: Payer: BC Managed Care – PPO | Admitting: Internal Medicine

## 2021-06-09 ENCOUNTER — Other Ambulatory Visit: Payer: Self-pay

## 2021-06-09 ENCOUNTER — Encounter: Payer: Self-pay | Admitting: Internal Medicine

## 2021-06-09 VITALS — BP 142/82 | HR 74 | Temp 98.7°F | Ht 72.0 in | Wt 339.0 lb

## 2021-06-09 DIAGNOSIS — E785 Hyperlipidemia, unspecified: Secondary | ICD-10-CM

## 2021-06-09 DIAGNOSIS — E039 Hypothyroidism, unspecified: Secondary | ICD-10-CM

## 2021-06-09 DIAGNOSIS — H8112 Benign paroxysmal vertigo, left ear: Secondary | ICD-10-CM

## 2021-06-09 DIAGNOSIS — Z23 Encounter for immunization: Secondary | ICD-10-CM

## 2021-06-09 DIAGNOSIS — M5441 Lumbago with sciatica, right side: Secondary | ICD-10-CM

## 2021-06-09 MED ORDER — BENAZEPRIL HCL 20 MG PO TABS: 20.0000 mg | ORAL_TABLET | Freq: Every day | ORAL | 3 refills | Status: DC

## 2021-06-09 MED ORDER — LOVASTATIN 40 MG PO TABS: 40.0000 mg | ORAL_TABLET | Freq: Every day | ORAL | 3 refills | Status: DC

## 2021-06-09 MED ORDER — AMLODIPINE BESYLATE 5 MG PO TABS: 5.0000 mg | ORAL_TABLET | Freq: Every day | ORAL | 3 refills | Status: DC

## 2021-06-09 MED ORDER — LEVOTHYROXINE SODIUM 125 MCG PO TABS: ORAL_TABLET | ORAL | 3 refills | Status: DC

## 2021-06-09 MED ORDER — TRIAMTERENE-HCTZ 37.5-25 MG PO TABS: 1.0000 | ORAL_TABLET | Freq: Every day | ORAL | 1 refills | Status: DC

## 2021-06-09 NOTE — Assessment & Plan Note (Addendum)
On Levothroid Check TSH 

## 2021-06-09 NOTE — Assessment & Plan Note (Signed)
Try intermittent fasting - 

## 2021-06-09 NOTE — Patient Instructions (Signed)

## 2021-06-09 NOTE — Progress Notes (Signed)
Subjective:  Patient ID: Randy [name], male    DOB: [birth year]  Age: 60 y.o. MRN: [id number]  CC: Follow-up (6 MONTH F/U)   HPI Randy [name] presents for L heel pain, HTN, CAD, LBP     Outpatient Medications Prior to Visit  Medication Sig Dispense Refill   Ascorbic Acid (VITAMIN C WITH ROSE HIPS) 1000 MG tablet Take 5,000 mg by mouth daily.     aspirin EC 81 MG tablet Take 81 mg by mouth daily. Swallow whole.     baclofen (LIORESAL) 20 MG tablet Take 0.5 tablets (10 mg total) by mouth 3 (three) times daily as needed for muscle spasms. 30 each 0   Cholecalciferol (VITAMIN D3) 50 MCG (2000 UT) TABS Take 2,000 Units by mouth daily.      Coenzyme Q10 (COQ10) 100 MG CAPS Take 1 capsule by mouth every morning.     Cyanocobalamin (B-12) 2000 MCG TABS Take 1 tablet by mouth every morning.     Echinacea 125 MG CAPS Take by mouth.     ibuprofen (ADVIL) 200 MG tablet Take 400 mg by mouth 3 (three) times daily.      meclizine (ANTIVERT) 25 MG tablet Take 1 tablet (25 mg total) by mouth 3 (three) times daily as needed for dizziness. 30 tablet 0   Multiple Vitamin (MULTIVITAMIN) tablet Take 1 tablet by mouth every morning.     Turmeric 500 MG TABS Take 1 capsule by mouth every morning.     amLODipine (NORVASC) 5 MG tablet Take 1 tablet by mouth once daily 90 tablet 1   benazepril (LOTENSIN) 20 MG tablet Take 1 tablet (20 mg total) by mouth daily. Annual appt due in Nov must see provider for future refills 90 tablet 1   levothyroxine (SYNTHROID) 125 MCG tablet TAKE 2 TABLETS BY MOUTH ONCE DAILY BEFORE BREAKFAST 180 tablet 1   lovastatin (MEVACOR) 40 MG tablet Take 1 tablet (40 mg total) by mouth at bedtime. Annual appt due in Nov must see provider for future refills 90 tablet 1   triamterene-hydrochlorothiazide (MAXZIDE-25) 37.5-25 MG tablet Take 1 tablet by mouth once daily 90 tablet 1   acetaminophen-codeine (TYLENOL #3) 300-30 MG tablet Take 1 tablet by mouth 4 (four) times daily as needed for  severe pain.  (Patient not taking: No sig reported)     No facility-administered medications prior to visit.    ROS: Review of Systems  Constitutional:  Negative for appetite change, fatigue and unexpected weight change.  HENT:  Negative for congestion, nosebleeds, sneezing, sore throat and trouble swallowing.   Eyes:  Negative for itching and visual disturbance.  Respiratory:  Negative for cough.   Cardiovascular:  Negative for chest pain, palpitations and leg swelling.  Gastrointestinal:  Negative for abdominal distention, blood in stool, diarrhea and nausea.  Genitourinary:  Negative for frequency and hematuria.  Musculoskeletal:  Positive for gait problem. Negative for back pain, joint swelling and neck pain.  Skin:  Negative for rash.  Neurological:  Negative for dizziness, tremors, speech difficulty and weakness.  Psychiatric/Behavioral:  Negative for agitation, dysphoric mood and sleep disturbance. The patient is not nervous/anxious.    Objective:  BP (!) 142/82 (BP Location: Left Arm)   Pulse 74   Temp 98.7 F (37.1 C) (Oral)   Ht 6' (1.829 m)   Wt (!) 339 lb (153.8 kg)   SpO2 97%   BMI 45.98 kg/m   BP Readings from Last 3 Encounters:  06/09/21 (!) 142/82  12/02/20 140/78  08/04/20 (!) 144/76    Wt Readings from Last 3 Encounters:  06/09/21 (!) 339 lb (153.8 kg)  12/02/20 (!) 336 lb 9.6 oz (152.7 kg)  08/04/20 (!) 335 lb 12.8 oz (152.3 kg)    Physical Exam Constitutional:      General: He is not in acute distress.    Appearance: He is well-developed. He is obese.     Comments: NAD  Eyes:     Conjunctiva/sclera: Conjunctivae normal.     Pupils: Pupils are equal, round, and reactive to light.  Neck:     Thyroid: No thyromegaly.     Vascular: No JVD.  Cardiovascular:     Rate and Rhythm: Normal rate and regular rhythm.     Heart sounds: Normal heart sounds. No murmur heard.   No friction rub. No gallop.  Pulmonary:     Effort: Pulmonary effort is normal.  No respiratory distress.     Breath sounds: Normal breath sounds. No wheezing or rales.  Chest:     Chest wall: No tenderness.  Abdominal:     General: Bowel sounds are normal. There is no distension.     Palpations: Abdomen is soft. There is no mass.     Tenderness: There is no abdominal tenderness. There is no guarding or rebound.  Musculoskeletal:        General: Tenderness present. Normal range of motion.     Cervical back: Normal range of motion.  Lymphadenopathy:     Cervical: No cervical adenopathy.  Skin:    General: Skin is warm and dry.     Findings: No rash.  Neurological:     Mental Status: He is alert and oriented to person, place, and time.     Cranial Nerves: No cranial nerve deficit.     Motor: No abnormal muscle tone.     Coordination: Coordination normal.     Gait: Gait normal.     Deep Tendon Reflexes: Reflexes are normal and symmetric.  Psychiatric:        Behavior: Behavior normal.        Thought Content: Thought content normal.        Judgment: Judgment normal.    Lab Results  Component Value Date   WBC 4.6 01/01/2021   HGB 14.2 01/01/2021   HCT 42.0 01/01/2021   PLT 237.0 01/01/2021   GLUCOSE 96 01/01/2021   CHOL 112 01/01/2021   TRIG 48.0 01/01/2021   HDL 35.60 (L) 01/01/2021   LDLCALC 67 01/01/2021   ALT 26 01/01/2021   AST 19 01/01/2021   NA 139 01/01/2021   K 4.0 01/01/2021   CL 103 01/01/2021   CREATININE 1.13 01/01/2021   BUN 18 01/01/2021   CO2 27 01/01/2021   TSH 0.81 01/01/2021   PSA 0.80 01/01/2021   INR 1.0 04/26/2020   HGBA1C 5.7 01/01/2021    CT HEAD WO CONTRAST  Result Date: 04/26/2020 CLINICAL DATA:  Dizziness with nausea and vomiting beginning this morning. EXAM: CT HEAD WITHOUT CONTRAST TECHNIQUE: Contiguous axial images were obtained from the base of the skull through the vertex without intravenous contrast. COMPARISON:  None. FINDINGS: Brain: Ventricles, cisterns and other CSF spaces are normal. There is no mass, mass  effect, shift of midline structures or acute hemorrhage. No evidence of acute infarction. Vascular: No hyperdense vessel or unexpected calcification. Skull: Normal. Negative for fracture or focal lesion. Sinuses/Orbits: Minimal opacification over the left frontal sinus and anterior ethmoid air cells. Paranasal sinuses are  otherwise unremarkable. Orbits are normal. Other: None. IMPRESSION: 1. No acute findings. 2. Minimal chronic sinus inflammatory change. Electronically Signed   By: Elberta Fortis M.D.   On: 04/26/2020 16:45   MR BRAIN WO CONTRAST  Result Date: 04/26/2020 CLINICAL DATA:  Initial evaluation for acute vertigo. EXAM: MRI HEAD WITHOUT CONTRAST TECHNIQUE: Multiplanar, multiecho pulse sequences of the brain and surrounding structures were obtained without intravenous contrast. COMPARISON:  Prior head CT from earlier the same day. FINDINGS: Brain: Cerebral volume within normal limits for patient age. Few scattered punctate foci of T2/FLAIR hyperintensity noted involving the supratentorial cerebral white matter, nonspecific, but felt to be within normal limits for age. No abnormal foci of restricted diffusion to suggest acute or subacute ischemia. Gray-white matter differentiation well maintained. No encephalomalacia to suggest chronic infarction. No foci of susceptibility artifact to suggest acute or chronic intracranial hemorrhage. No mass lesion, midline shift or mass effect. No hydrocephalus. No extra-axial fluid collection. Major dural sinuses are grossly patent. Pituitary gland and suprasellar region are normal. Midline structures intact and normal. Vascular: Major intracranial vascular flow voids well maintained and normal in appearance. Skull and upper cervical spine: Craniocervical junction normal. Visualized upper cervical spine within normal limits. Bone marrow signal intensity normal. No scalp soft tissue abnormality. Sinuses/Orbits: Globes and orbital soft tissues within normal limits.  Scattered mucosal thickening noted within the frontoethmoidal sinuses. Paranasal sinuses are otherwise clear. No mastoid effusion. Inner ear structures normal. Other: None. IMPRESSION: Normal brain MRI for age. No acute intracranial abnormality identified. Electronically Signed   By: Rise Mu M.D.   On: 04/26/2020 21:46    Assessment & Plan:   Problem List Items Addressed This Visit     Benign paroxysmal positional vertigo, left    No relapse      Dyslipidemia    A  cardiac CT scan for calcium scoring -- scheduled On Lovastatin      Relevant Medications   lovastatin (MEVACOR) 40 MG tablet   Other Relevant Orders   CT CARDIAC SCORING (SELF PAY ONLY)   Hypothyroidism - Primary    On Levothroid Check TSH      Relevant Medications   levothyroxine (SYNTHROID) 125 MCG tablet   Low back pain    Overall resolved      OBESITY, MORBID    Try intermittent fasting      Other Visit Diagnoses     Needs flu shot             Follow-up: Return in about 4 months (around 10/09/2021) for a follow-up visit.  Sonda Primes, MD

## 2021-06-09 NOTE — Assessment & Plan Note (Signed)
A  cardiac CT scan for calcium scoring -- scheduled On Lovastatin

## 2021-06-19 NOTE — Assessment & Plan Note (Addendum)
Overall - resolved 

## 2021-06-19 NOTE — Assessment & Plan Note (Signed)
No relapse 

## 2021-11-11 ENCOUNTER — Ambulatory Visit: Payer: BC Managed Care – PPO | Admitting: Internal Medicine

## 2021-11-11 ENCOUNTER — Other Ambulatory Visit: Payer: Self-pay

## 2021-11-11 ENCOUNTER — Encounter: Payer: Self-pay | Admitting: Internal Medicine

## 2021-11-11 VITALS — BP 118/68 | HR 73 | Temp 98.2°F | Ht 72.0 in | Wt 347.0 lb

## 2021-11-11 DIAGNOSIS — R7303 Prediabetes: Secondary | ICD-10-CM

## 2021-11-11 DIAGNOSIS — E785 Hyperlipidemia, unspecified: Secondary | ICD-10-CM

## 2021-11-11 DIAGNOSIS — Z0000 Encounter for general adult medical examination without abnormal findings: Secondary | ICD-10-CM

## 2021-11-11 MED ORDER — BENAZEPRIL HCL 20 MG PO TABS: 20.0000 mg | ORAL_TABLET | Freq: Every day | ORAL | 3 refills | Status: DC

## 2021-11-11 MED ORDER — AMLODIPINE BESYLATE 5 MG PO TABS: 5.0000 mg | ORAL_TABLET | Freq: Every day | ORAL | 3 refills | Status: DC

## 2021-11-11 MED ORDER — TRIAMTERENE-HCTZ 37.5-25 MG PO TABS: 1.0000 | ORAL_TABLET | Freq: Every day | ORAL | 3 refills | Status: DC

## 2021-11-11 MED ORDER — OZEMPIC (0.25 OR 0.5 MG/DOSE) 2 MG/1.5ML ~~LOC~~ SOPN: 0.2500 mg | PEN_INJECTOR | SUBCUTANEOUS | 2 refills | Status: DC

## 2021-11-11 MED ORDER — LEVOTHYROXINE SODIUM 125 MCG PO TABS: ORAL_TABLET | ORAL | 3 refills | Status: DC

## 2021-11-11 NOTE — Assessment & Plan Note (Addendum)
Worse ?Options discussed - Phentermine ?Rx for Ozempic ?

## 2021-11-11 NOTE — Assessment & Plan Note (Signed)
A  cardiac CT scan  - pt needs to call and schedule ?Cont on Lovastatin ?

## 2021-11-11 NOTE — Patient Instructions (Addendum)
Ozempic=Wegovy ? ? ?Cardiac CT calcium scoring test $99 ?Tel # is [phone] ? ? ?Computed tomography, more commonly known as a CT or CAT scan, is a diagnostic medical imaging test. Like traditional x-rays, it produces multiple images or pictures of the inside of the body. ?The cross-sectional images generated during a CT scan can be reformatted in multiple planes. They can even generate three-dimensional images. These images can be viewed on a computer monitor, printed on film or by a 3D printer, or transferred to a CD or DVD. ?CT images of internal organs, bones, soft tissue and blood vessels provide greater detail than traditional x-rays, particularly of soft tissues and blood vessels. ?A cardiac CT scan for coronary calcium is a non-invasive way of obtaining information about the presence, location and extent of calcified plaque in the coronary arteries--the vessels that supply oxygen-containing blood to the heart muscle. Calcified plaque results when there is a build-up of fat and other substances under the inner layer of the artery. This material can calcify which signals the presence of atherosclerosis, a disease of the vessel wall, also called coronary artery disease (CAD). People with this disease have an increased risk for heart attacks. In addition, over time, progression of plaque build up (CAD) can narrow the arteries or even close off blood flow to the heart. The result may be chest pain, sometimes called "angina," or a heart attack. ?Because calcium is a marker of CAD, the amount of calcium detected on a cardiac CT scan is a helpful prognostic tool. The findings on cardiac CT are expressed as a calcium score. Another name for this test is coronary artery calcium scoring. ? ?What are some common uses of the procedure? ?The goal of cardiac CT scan for calcium scoring is to determine if CAD is present and to what extent, even if there are no symptoms. It is a screening study that may be recommended by  a physician for patients with risk factors for CAD but no clinical symptoms. ?The major risk factors for CAD are: ?high blood cholesterol levels  ?family history of heart attacks  ?diabetes  ?high blood pressure  ?cigarette smoking  ?overweight or obese  ?physical inactivity ? ? ?A negative cardiac CT scan for calcium scoring shows no calcification within the coronary arteries. This suggests that CAD is absent or so minimal it cannot be seen by this technique. The chance of having a heart attack over the next two to five years is very low under these circumstances. ?A positive test means that CAD is present, regardless of whether or not the patient is experiencing any symptoms. The amount of calcification--expressed as the calcium score--may help to predict the likelihood of a myocardial infarction (heart attack) in the coming years and helps your medical doctor or cardiologist decide whether the patient may need to take preventive medicine or undertake other measures such as diet and exercise to lower the risk for heart attack. ?The extent of CAD is graded according to your calcium score: ? ?Calcium Score  Presence of CAD (coronary artery disease)  ?0 No evidence of CAD   ?1-10 Minimal evidence of CAD  ?11-100 Mild evidence of CAD  ?101-400 Moderate evidence of CAD  ?Over 400 Extensive evidence of CAD  ? ?Coronary artery calcium (CAC) score is a strong predictor of ?incident coronary heart disease (CHD) and provides predictive ?information beyond traditional risk factors. CAC scoring is ?reasonable to use in the decision to withhold, postpone, or initiate ?statin therapy in intermediate-risk or  selected borderline-risk ?asymptomatic adults (age 63-75 years and LDL-C >=70 to <190 mg/dL) ?who do not have diabetes or established atherosclerotic ?cardiovascular disease (ASCVD).* In intermediate-risk (10-year ASCVD ?risk >=7.5% to <20%) adults or selected borderline-risk (10-year ?ASCVD risk >=5% to <7.5%) adults in  whom a CAC score is measured for ?the purpose of making a treatment decision the following ?recommendations have been made: ?  ?If CAC=0, it is reasonable to withhold statin therapy and reassess ?in 5 to 10 years, as long as higher risk conditions are absent ?(diabetes mellitus, family history of premature CHD in first degree ?relatives (males <55 years; females <65 years), cigarette smoking, ?or LDL >=190 mg/dL). ?  ?If CAC is 1 to 99, it is reasonable to initiate statin therapy for ?patients >=63 years of age. ?  ?If CAC is >=100 or >=75th percentile, it is reasonable to initiate ?statin therapy at any age. ?  ?Cardiology referral should be considered for patients with CAC ?scores >=400 or >=75th percentile. ?  ?*2018 AHA/ACC/AACVPR/AAPA/ABC/ACPM/ADA/AGS/APhA/ASPC/NLA/PCNA ?Guideline on the Management of Blood Cholesterol: A Report of the ?[employer] Task Force ?on Clinical Practice Guidelines. J Am Coll Cardiol. ?2019;73(24):3168-3209. ? ?

## 2021-11-11 NOTE — Assessment & Plan Note (Signed)
We can try Ozempic ?

## 2021-11-11 NOTE — Progress Notes (Signed)
Subjective:  Patient ID: Randy [name], male    DOB: [birth year]  Age: 61 y.o. MRN: [id number]  CC: Follow-up (No concerns. )   HPI Randy [name] presents for heel pain - better LBP - no relapse F/u HTN, pre-DM, hypogonadism  Outpatient Medications Prior to Visit  Medication Sig Dispense Refill   Ascorbic Acid (VITAMIN C WITH ROSE HIPS) 1000 MG tablet Take 5,000 mg by mouth daily.     aspirin EC 81 MG tablet Take 81 mg by mouth daily. Swallow whole.     baclofen (LIORESAL) 20 MG tablet Take 0.5 tablets (10 mg total) by mouth 3 (three) times daily as needed for muscle spasms. 30 each 0   Cholecalciferol (VITAMIN D3) 50 MCG (2000 UT) TABS Take 2,000 Units by mouth daily.      Coenzyme Q10 (COQ10) 100 MG CAPS Take 1 capsule by mouth every morning.     Cyanocobalamin (B-12) 2000 MCG TABS Take 1 tablet by mouth every morning.     Echinacea 125 MG CAPS Take by mouth.     ibuprofen (ADVIL) 200 MG tablet Take 400 mg by mouth 3 (three) times daily.      lovastatin (MEVACOR) 40 MG tablet Take 1 tablet (40 mg total) by mouth at bedtime. 90 tablet 3   meclizine (ANTIVERT) 25 MG tablet Take 1 tablet (25 mg total) by mouth 3 (three) times daily as needed for dizziness. 30 tablet 0   Multiple Vitamin (MULTIVITAMIN) tablet Take 1 tablet by mouth every morning.     Turmeric 500 MG TABS Take 1 capsule by mouth every morning.     amLODipine (NORVASC) 5 MG tablet Take 1 tablet (5 mg total) by mouth daily. 90 tablet 3   benazepril (LOTENSIN) 20 MG tablet Take 1 tablet (20 mg total) by mouth daily. Annual appt due in Nov must see provider for future refills 90 tablet 3   levothyroxine (SYNTHROID) 125 MCG tablet TAKE 2 TABLETS BY MOUTH ONCE DAILY BEFORE BREAKFAST 180 tablet 3   triamterene-hydrochlorothiazide (MAXZIDE-25) 37.5-25 MG tablet Take 1 tablet by mouth daily. 90 tablet 1   No facility-administered medications prior to visit.    ROS: Review of Systems  Constitutional:  Positive for unexpected  weight change. Negative for appetite change and fatigue.  HENT:  Negative for congestion, nosebleeds, sneezing, sore throat and trouble swallowing.   Eyes:  Negative for itching and visual disturbance.  Respiratory:  Negative for cough.   Cardiovascular:  Negative for chest pain, palpitations and leg swelling.  Gastrointestinal:  Negative for abdominal distention, blood in stool, diarrhea and nausea.  Genitourinary:  Negative for frequency and hematuria.  Musculoskeletal:  Positive for arthralgias. Negative for back pain, gait problem, joint swelling and neck pain.  Skin:  Negative for rash.  Neurological:  Negative for dizziness, tremors, speech difficulty and weakness.  Psychiatric/Behavioral:  Negative for agitation, dysphoric mood, sleep disturbance and suicidal ideas. The patient is not nervous/anxious.    Objective:  BP 118/68 (BP Location: Left Arm, Patient Position: Sitting, Cuff Size: Large)    Pulse 73    Temp 98.2 F (36.8 C) (Oral)    Ht 6' (1.829 m)    Wt (!) 347 lb (157.4 kg)    SpO2 98%    BMI 47.06 kg/m   BP Readings from Last 3 Encounters:  11/11/21 118/68  06/09/21 (!) 142/82  12/02/20 140/78    Wt Readings from Last 3 Encounters:  11/11/21 (!) 347 lb (157.4 kg)  06/09/21 (!) 339 lb (153.8 kg)  12/02/20 (!) 336 lb 9.6 oz (152.7 kg)    Physical Exam Constitutional:      General: He is not in acute distress.    Appearance: He is well-developed. He is obese.     Comments: NAD  Eyes:     Conjunctiva/sclera: Conjunctivae normal.     Pupils: Pupils are equal, round, and reactive to light.  Neck:     Thyroid: No thyromegaly.     Vascular: No JVD.  Cardiovascular:     Rate and Rhythm: Normal rate and regular rhythm.     Heart sounds: Normal heart sounds. No murmur heard.   No friction rub. No gallop.  Pulmonary:     Effort: Pulmonary effort is normal. No respiratory distress.     Breath sounds: Normal breath sounds. No wheezing or rales.  Chest:     Chest  wall: No tenderness.  Abdominal:     General: Bowel sounds are normal. There is no distension.     Palpations: Abdomen is soft. There is no mass.     Tenderness: There is no abdominal tenderness. There is no guarding or rebound.  Musculoskeletal:        General: No tenderness. Normal range of motion.     Cervical back: Normal range of motion.  Lymphadenopathy:     Cervical: No cervical adenopathy.  Skin:    General: Skin is warm and dry.     Findings: No rash.  Neurological:     Mental Status: He is alert and oriented to person, place, and time.     Cranial Nerves: No cranial nerve deficit.     Motor: No abnormal muscle tone.     Coordination: Coordination normal.     Gait: Gait normal.     Deep Tendon Reflexes: Reflexes are normal and symmetric.  Psychiatric:        Behavior: Behavior normal.        Thought Content: Thought content normal.        Judgment: Judgment normal.    Lab Results  Component Value Date   WBC 4.6 01/01/2021   HGB 14.2 01/01/2021   HCT 42.0 01/01/2021   PLT 237.0 01/01/2021   GLUCOSE 96 01/01/2021   CHOL 112 01/01/2021   TRIG 48.0 01/01/2021   HDL 35.60 (L) 01/01/2021   LDLCALC 67 01/01/2021   ALT 26 01/01/2021   AST 19 01/01/2021   NA 139 01/01/2021   K 4.0 01/01/2021   CL 103 01/01/2021   CREATININE 1.13 01/01/2021   BUN 18 01/01/2021   CO2 27 01/01/2021   TSH 0.81 01/01/2021   PSA 0.80 01/01/2021   INR 1.0 04/26/2020   HGBA1C 5.7 01/01/2021    CT HEAD WO CONTRAST  Result Date: 04/26/2020 CLINICAL DATA:  Dizziness with nausea and vomiting beginning this morning. EXAM: CT HEAD WITHOUT CONTRAST TECHNIQUE: Contiguous axial images were obtained from the base of the skull through the vertex without intravenous contrast. COMPARISON:  None. FINDINGS: Brain: Ventricles, cisterns and other CSF spaces are normal. There is no mass, mass effect, shift of midline structures or acute hemorrhage. No evidence of acute infarction. Vascular: No hyperdense  vessel or unexpected calcification. Skull: Normal. Negative for fracture or focal lesion. Sinuses/Orbits: Minimal opacification over the left frontal sinus and anterior ethmoid air cells. Paranasal sinuses are otherwise unremarkable. Orbits are normal. Other: None. IMPRESSION: 1. No acute findings. 2. Minimal chronic sinus inflammatory change. Electronically Signed   By: Reuel Boom  Micheline Maze M.D.   On: 04/26/2020 16:45   MR BRAIN WO CONTRAST  Result Date: 04/26/2020 CLINICAL DATA:  Initial evaluation for acute vertigo. EXAM: MRI HEAD WITHOUT CONTRAST TECHNIQUE: Multiplanar, multiecho pulse sequences of the brain and surrounding structures were obtained without intravenous contrast. COMPARISON:  Prior head CT from earlier the same day. FINDINGS: Brain: Cerebral volume within normal limits for patient age. Few scattered punctate foci of T2/FLAIR hyperintensity noted involving the supratentorial cerebral white matter, nonspecific, but felt to be within normal limits for age. No abnormal foci of restricted diffusion to suggest acute or subacute ischemia. Gray-white matter differentiation well maintained. No encephalomalacia to suggest chronic infarction. No foci of susceptibility artifact to suggest acute or chronic intracranial hemorrhage. No mass lesion, midline shift or mass effect. No hydrocephalus. No extra-axial fluid collection. Major dural sinuses are grossly patent. Pituitary gland and suprasellar region are normal. Midline structures intact and normal. Vascular: Major intracranial vascular flow voids well maintained and normal in appearance. Skull and upper cervical spine: Craniocervical junction normal. Visualized upper cervical spine within normal limits. Bone marrow signal intensity normal. No scalp soft tissue abnormality. Sinuses/Orbits: Globes and orbital soft tissues within normal limits. Scattered mucosal thickening noted within the frontoethmoidal sinuses. Paranasal sinuses are otherwise clear. No  mastoid effusion. Inner ear structures normal. Other: None. IMPRESSION: Normal brain MRI for age. No acute intracranial abnormality identified. Electronically Signed   By: Rise Mu M.D.   On: 04/26/2020 21:46    Assessment & Plan:   Problem List Items Addressed This Visit     Dyslipidemia    A  cardiac CT scan  - pt needs to call and schedule Cont on Lovastatin      Relevant Orders   Testosterone   OBESITY, MORBID    Worse Options discussed - Phentermine Rx for Ozempic      Relevant Medications   Semaglutide,0.25 or 0.5MG /DOS, (OZEMPIC, 0.25 OR 0.5 MG/DOSE,) 2 MG/1.5ML SOPN   Other Relevant Orders   Testosterone   Prediabetes    We can try Ozempic      Relevant Orders   Testosterone   Hemoglobin A1c   Well adult exam - Primary   Relevant Orders   TSH   Urinalysis   CBC with Differential/Platelet   Lipid panel   Comprehensive metabolic panel   PSA   Testosterone   Hemoglobin A1c      Meds ordered this encounter  Medications   triamterene-hydrochlorothiazide (MAXZIDE-25) 37.5-25 MG tablet    Sig: Take 1 tablet by mouth daily.    Dispense:  90 tablet    Refill:  3   benazepril (LOTENSIN) 20 MG tablet    Sig: Take 1 tablet (20 mg total) by mouth daily. Annual appt due in Nov must see provider for future refills    Dispense:  90 tablet    Refill:  3   amLODipine (NORVASC) 5 MG tablet    Sig: Take 1 tablet (5 mg total) by mouth daily.    Dispense:  90 tablet    Refill:  3   levothyroxine (SYNTHROID) 125 MCG tablet    Sig: TAKE 2 TABLETS BY MOUTH ONCE DAILY BEFORE BREAKFAST    Dispense:  180 tablet    Refill:  3    Annual appt due in Nov must see provider for future refills   Semaglutide,0.25 or 0.5MG /DOS, (OZEMPIC, 0.25 OR 0.5 MG/DOSE,) 2 MG/1.5ML SOPN    Sig: Inject 0.25 mg into the skin once a week.  Dispense:  3 mL    Refill:  2      Follow-up: Return in about 4 months (around 03/13/2022) for a follow-up visit.  Sonda PrimesAlex Evelina Lore, MD

## 2021-12-23 ENCOUNTER — Other Ambulatory Visit (INDEPENDENT_AMBULATORY_CARE_PROVIDER_SITE_OTHER): Payer: BC Managed Care – PPO

## 2021-12-23 DIAGNOSIS — Z125 Encounter for screening for malignant neoplasm of prostate: Secondary | ICD-10-CM

## 2021-12-23 DIAGNOSIS — R7303 Prediabetes: Secondary | ICD-10-CM

## 2021-12-23 DIAGNOSIS — Z0000 Encounter for general adult medical examination without abnormal findings: Secondary | ICD-10-CM

## 2021-12-23 DIAGNOSIS — E785 Hyperlipidemia, unspecified: Secondary | ICD-10-CM | POA: Diagnosis not present

## 2021-12-23 LAB — LIPID PANEL
Cholesterol: 132 mg/dL (ref 0–200)
HDL: 35 mg/dL — ABNORMAL LOW (ref >39.00)
LDL Cholesterol: 80 mg/dL (ref 0–99)
NonHDL: 96.66
Total CHOL/HDL Ratio: 4
Triglycerides: 81 mg/dL (ref 0.0–149.0)
VLDL: 16.2 mg/dL (ref 0.0–40.0)

## 2021-12-23 LAB — CBC WITH DIFFERENTIAL/PLATELET
Basophils Absolute: 0.1 10*3/uL (ref 0.0–0.1)
Basophils Relative: 1 % (ref 0.0–3.0)
Eosinophils Absolute: 0.2 10*3/uL (ref 0.0–0.7)
Eosinophils Relative: 4.3 % (ref 0.0–5.0)
HCT: 42.2 % (ref 39.0–52.0)
Hemoglobin: 14.4 g/dL (ref 13.0–17.0)
Lymphocytes Relative: 25.5 % (ref 12.0–46.0)
Lymphs Abs: 1.4 10*3/uL (ref 0.7–4.0)
MCHC: 34.2 g/dL (ref 30.0–36.0)
MCV: 90.4 fl (ref 78.0–100.0)
Monocytes Absolute: 0.6 10*3/uL (ref 0.1–1.0)
Monocytes Relative: 11.5 % (ref 3.0–12.0)
Neutro Abs: 3.1 10*3/uL (ref 1.4–7.7)
Neutrophils Relative %: 57.7 % (ref 43.0–77.0)
Platelets: 238 10*3/uL (ref 150.0–400.0)
RBC: 4.66 Mil/uL (ref 4.22–5.81)
RDW: 13.2 % (ref 11.5–15.5)
WBC: 5.3 10*3/uL (ref 4.0–10.5)

## 2021-12-23 LAB — COMPREHENSIVE METABOLIC PANEL
ALT: 24 U/L (ref 0–53)
AST: 16 U/L (ref 0–37)
Albumin: 4.2 g/dL (ref 3.5–5.2)
Alkaline Phosphatase: 42 U/L (ref 39–117)
BUN: 20 mg/dL (ref 6–23)
CO2: 26 mEq/L (ref 19–32)
Calcium: 9.1 mg/dL (ref 8.4–10.5)
Chloride: 103 mEq/L (ref 96–112)
Creatinine, Ser: 1.42 mg/dL (ref 0.40–1.50)
GFR: 53.2 mL/min — ABNORMAL LOW (ref >60.00)
Glucose, Bld: 102 mg/dL — ABNORMAL HIGH (ref 70–99)
Potassium: 4.1 mEq/L (ref 3.5–5.1)
Sodium: 138 mEq/L (ref 135–145)
Total Bilirubin: 0.6 mg/dL (ref 0.2–1.2)
Total Protein: 6.4 g/dL (ref 6.0–8.3)

## 2021-12-23 LAB — URINALYSIS
Bilirubin Urine: NEGATIVE
Hgb urine dipstick: NEGATIVE
Ketones, ur: NEGATIVE
Leukocytes,Ua: NEGATIVE
Nitrite: NEGATIVE
Specific Gravity, Urine: 1.01 (ref 1.000–1.030)
Total Protein, Urine: NEGATIVE
Urine Glucose: NEGATIVE
Urobilinogen, UA: 0.2 (ref 0.0–1.0)
pH: 6.5 (ref 5.0–8.0)

## 2021-12-23 LAB — PSA: PSA: 0.58 ng/mL (ref 0.10–4.00)

## 2021-12-23 LAB — TESTOSTERONE: Testosterone: 321.7 ng/dL (ref 300.00–890.00)

## 2021-12-23 LAB — TSH: TSH: 5.22 u[IU]/mL (ref 0.35–5.50)

## 2021-12-23 LAB — HEMOGLOBIN A1C: Hgb A1c MFr Bld: 5.8 % (ref 4.6–6.5)

## 2022-03-24 ENCOUNTER — Ambulatory Visit: Payer: BC Managed Care – PPO | Admitting: Internal Medicine

## 2022-04-20 ENCOUNTER — Encounter (INDEPENDENT_AMBULATORY_CARE_PROVIDER_SITE_OTHER): Payer: Self-pay

## 2022-04-26 ENCOUNTER — Encounter: Payer: Self-pay | Admitting: Internal Medicine

## 2022-04-26 ENCOUNTER — Ambulatory Visit: Payer: BC Managed Care – PPO | Admitting: Internal Medicine

## 2022-04-26 DIAGNOSIS — E785 Hyperlipidemia, unspecified: Secondary | ICD-10-CM | POA: Diagnosis not present

## 2022-04-26 DIAGNOSIS — R7303 Prediabetes: Secondary | ICD-10-CM

## 2022-04-26 DIAGNOSIS — E039 Hypothyroidism, unspecified: Secondary | ICD-10-CM

## 2022-04-26 MED ORDER — RYBELSUS 3 MG PO TABS: 3.0000 mg | ORAL_TABLET | Freq: Every day | ORAL | 3 refills | Status: DC

## 2022-04-26 NOTE — Assessment & Plan Note (Signed)
On Levothroid 

## 2022-04-26 NOTE — Progress Notes (Signed)
Subjective:  Patient ID: Randy [name], male    DOB: March 20, 1961  Age: 61 y.o. MRN: [id number]  CC: No chief complaint on file.   HPI Randy [name] presents for pre-DM, obesity, HTN  Outpatient Medications Prior to Visit  Medication Sig Dispense Refill   amLODipine (NORVASC) 5 MG tablet Take 1 tablet (5 mg total) by mouth daily. 90 tablet 3   Ascorbic Acid (VITAMIN C WITH ROSE HIPS) 1000 MG tablet Take 5,000 mg by mouth daily.     aspirin EC 81 MG tablet Take 81 mg by mouth daily. Swallow whole.     baclofen (LIORESAL) 20 MG tablet Take 0.5 tablets (10 mg total) by mouth 3 (three) times daily as needed for muscle spasms. 30 each 0   benazepril (LOTENSIN) 20 MG tablet Take 1 tablet (20 mg total) by mouth daily. Annual appt due in Nov must see provider for future refills 90 tablet 3   Cholecalciferol (VITAMIN D3) 50 MCG (2000 UT) TABS Take 2,000 Units by mouth daily.      Coenzyme Q10 (COQ10) 100 MG CAPS Take 1 capsule by mouth every morning.     Cyanocobalamin (B-12) 2000 MCG TABS Take 1 tablet by mouth every morning.     Echinacea 125 MG CAPS Take by mouth.     ibuprofen (ADVIL) 200 MG tablet Take 400 mg by mouth 3 (three) times daily.      levothyroxine (SYNTHROID) 125 MCG tablet TAKE 2 TABLETS BY MOUTH ONCE DAILY BEFORE BREAKFAST 180 tablet 3   lovastatin (MEVACOR) 40 MG tablet Take 1 tablet (40 mg total) by mouth at bedtime. 90 tablet 3   meclizine (ANTIVERT) 25 MG tablet Take 1 tablet (25 mg total) by mouth 3 (three) times daily as needed for dizziness. 30 tablet 0   Multiple Vitamin (MULTIVITAMIN) tablet Take 1 tablet by mouth every morning.     triamterene-hydrochlorothiazide (MAXZIDE-25) 37.5-25 MG tablet Take 1 tablet by mouth daily. 90 tablet 3   Turmeric 500 MG TABS Take 1 capsule by mouth every morning.     Semaglutide,0.25 or 0.5MG /DOS, (OZEMPIC, 0.25 OR 0.5 MG/DOSE,) 2 MG/1.5ML SOPN Inject 0.25 mg into the skin once a week. 3 mL 2   No facility-administered medications prior  to visit.    ROS: Review of Systems  Constitutional:  Negative for appetite change, fatigue and unexpected weight change.  HENT:  Negative for congestion, nosebleeds, sneezing, sore throat and trouble swallowing.   Eyes:  Negative for itching and visual disturbance.  Respiratory:  Negative for cough.   Cardiovascular:  Negative for chest pain, palpitations and leg swelling.  Gastrointestinal:  Negative for abdominal distention, blood in stool, diarrhea and nausea.  Genitourinary:  Negative for frequency and hematuria.  Musculoskeletal:  Negative for back pain, gait problem, joint swelling and neck pain.  Skin:  Negative for rash.  Neurological:  Negative for dizziness, tremors, speech difficulty and weakness.  Psychiatric/Behavioral:  Negative for agitation, dysphoric mood and sleep disturbance. The patient is not nervous/anxious.     Objective:  BP 110/70 (BP Location: Left Arm, Patient Position: Sitting, Cuff Size: Large)   Pulse 80   Temp 98.6 F (37 C) (Oral)   Ht 6' (1.829 m)   Wt (!) 347 lb (157.4 kg)   SpO2 96%   BMI 47.06 kg/m   BP Readings from Last 3 Encounters:  04/26/22 110/70  11/11/21 118/68  06/09/21 (!) 142/82    Wt Readings from Last 3 Encounters:  04/26/22 Marland Kitchen)  347 lb (157.4 kg)  11/11/21 (!) 347 lb (157.4 kg)  06/09/21 (!) 339 lb (153.8 kg)    Physical Exam Constitutional:      General: He is not in acute distress.    Appearance: He is well-developed.     Comments: NAD  Eyes:     Conjunctiva/sclera: Conjunctivae normal.     Pupils: Pupils are equal, round, and reactive to light.  Neck:     Thyroid: No thyromegaly.     Vascular: No JVD.  Cardiovascular:     Rate and Rhythm: Normal rate and regular rhythm.     Heart sounds: Normal heart sounds. No murmur heard.    No friction rub. No gallop.  Pulmonary:     Effort: Pulmonary effort is normal. No respiratory distress.     Breath sounds: Normal breath sounds. No wheezing or rales.  Chest:      Chest wall: No tenderness.  Abdominal:     General: Bowel sounds are normal. There is no distension.     Palpations: Abdomen is soft. There is no mass.     Tenderness: There is no abdominal tenderness. There is no guarding or rebound.  Musculoskeletal:        General: No tenderness. Normal range of motion.     Cervical back: Normal range of motion.  Lymphadenopathy:     Cervical: No cervical adenopathy.  Skin:    General: Skin is warm and dry.     Findings: No rash.  Neurological:     Mental Status: He is alert and oriented to person, place, and time.     Cranial Nerves: No cranial nerve deficit.     Motor: No abnormal muscle tone.     Coordination: Coordination normal.     Gait: Gait normal.     Deep Tendon Reflexes: Reflexes are normal and symmetric.  Psychiatric:        Behavior: Behavior normal.        Thought Content: Thought content normal.        Judgment: Judgment normal.     Lab Results  Component Value Date   WBC 5.3 12/23/2021   HGB 14.4 12/23/2021   HCT 42.2 12/23/2021   PLT 238.0 12/23/2021   GLUCOSE 102 (H) 12/23/2021   CHOL 132 12/23/2021   TRIG 81.0 12/23/2021   HDL 35.00 (L) 12/23/2021   LDLCALC 80 12/23/2021   ALT 24 12/23/2021   AST 16 12/23/2021   NA 138 12/23/2021   K 4.1 12/23/2021   CL 103 12/23/2021   CREATININE 1.42 12/23/2021   BUN 20 12/23/2021   CO2 26 12/23/2021   TSH 5.22 12/23/2021   PSA 0.58 12/23/2021   INR 1.0 04/26/2020   HGBA1C 5.8 12/23/2021    MR BRAIN WO CONTRAST  Result Date: 04/26/2020 CLINICAL DATA:  Initial evaluation for acute vertigo. EXAM: MRI HEAD WITHOUT CONTRAST TECHNIQUE: Multiplanar, multiecho pulse sequences of the brain and surrounding structures were obtained without intravenous contrast. COMPARISON:  Prior head CT from earlier the same day. FINDINGS: Brain: Cerebral volume within normal limits for patient age. Few scattered punctate foci of T2/FLAIR hyperintensity noted involving the supratentorial cerebral  white matter, nonspecific, but felt to be within normal limits for age. No abnormal foci of restricted diffusion to suggest acute or subacute ischemia. Gray-white matter differentiation well maintained. No encephalomalacia to suggest chronic infarction. No foci of susceptibility artifact to suggest acute or chronic intracranial hemorrhage. No mass lesion, midline shift or mass effect. No hydrocephalus.  No extra-axial fluid collection. Major dural sinuses are grossly patent. Pituitary gland and suprasellar region are normal. Midline structures intact and normal. Vascular: Major intracranial vascular flow voids well maintained and normal in appearance. Skull and upper cervical spine: Craniocervical junction normal. Visualized upper cervical spine within normal limits. Bone marrow signal intensity normal. No scalp soft tissue abnormality. Sinuses/Orbits: Globes and orbital soft tissues within normal limits. Scattered mucosal thickening noted within the frontoethmoidal sinuses. Paranasal sinuses are otherwise clear. No mastoid effusion. Inner ear structures normal. Other: None. IMPRESSION: Normal brain MRI for age. No acute intracranial abnormality identified. Electronically Signed   By: Rise Mu M.D.   On: 04/26/2020 21:46   CT HEAD WO CONTRAST  Result Date: 04/26/2020 CLINICAL DATA:  Dizziness with nausea and vomiting beginning this morning. EXAM: CT HEAD WITHOUT CONTRAST TECHNIQUE: Contiguous axial images were obtained from the base of the skull through the vertex without intravenous contrast. COMPARISON:  None. FINDINGS: Brain: Ventricles, cisterns and other CSF spaces are normal. There is no mass, mass effect, shift of midline structures or acute hemorrhage. No evidence of acute infarction. Vascular: No hyperdense vessel or unexpected calcification. Skull: Normal. Negative for fracture or focal lesion. Sinuses/Orbits: Minimal opacification over the left frontal sinus and anterior ethmoid air cells.  Paranasal sinuses are otherwise unremarkable. Orbits are normal. Other: None. IMPRESSION: 1. No acute findings. 2. Minimal chronic sinus inflammatory change. Electronically Signed   By: Elberta Fortis M.D.   On: 04/26/2020 16:45    Assessment & Plan:   Problem List Items Addressed This Visit     Dyslipidemia    On Lovastatin      Hypothyroidism    On Levothroid      OBESITY, MORBID    Wt Readings from Last 3 Encounters:  04/26/22 (!) 347 lb (157.4 kg)  11/11/21 (!) 347 lb (157.4 kg)  06/09/21 (!) 339 lb (153.8 kg)  We discussed our options again       Relevant Medications   Semaglutide (RYBELSUS) 3 MG TABS   Prediabetes    Start Rybelsus      Relevant Orders   Comprehensive metabolic panel   Hemoglobin A1c      Meds ordered this encounter  Medications   Semaglutide (RYBELSUS) 3 MG TABS    Sig: Take 3 mg by mouth daily.    Dispense:  30 tablet    Refill:  3      Follow-up: Return in about 3 months (around 07/27/2022) for a follow-up visit.  Sonda Primes, MD

## 2022-04-26 NOTE — Assessment & Plan Note (Signed)
Start Rybelsus 

## 2022-04-26 NOTE — Assessment & Plan Note (Signed)
Wt Readings from Last 3 Encounters:  04/26/22 (!) 347 lb (157.4 kg)  11/11/21 (!) 347 lb (157.4 kg)  06/09/21 (!) 339 lb (153.8 kg)  We discussed our options again

## 2022-04-26 NOTE — Assessment & Plan Note (Signed)
On Lovastatin 

## 2022-04-27 ENCOUNTER — Telehealth: Payer: Self-pay | Admitting: *Deleted

## 2022-04-27 NOTE — Telephone Encounter (Signed)
Rec' fax pt need PA on Rybelsus 3 mg. Submitted via [facility]-my-meds w/ (Key: [id number]). PA sent to [email or web address] Chesterfield

## 2022-04-28 NOTE — Telephone Encounter (Signed)
Rec'd determination med was APPROVED. Effective 04/27/2022 - 04/27/2025. Faxing approval to [email or web address]

## 2022-05-05 ENCOUNTER — Encounter: Payer: Self-pay | Admitting: Internal Medicine

## 2022-05-06 ENCOUNTER — Encounter: Payer: Self-pay | Admitting: Internal Medicine

## 2022-05-06 DIAGNOSIS — Z9009 Acquired absence of other part of head and neck: Secondary | ICD-10-CM | POA: Insufficient documentation

## 2022-06-09 ENCOUNTER — Encounter: Payer: Self-pay | Admitting: Internal Medicine

## 2022-06-12 ENCOUNTER — Other Ambulatory Visit: Payer: Self-pay | Admitting: Internal Medicine

## 2022-06-16 ENCOUNTER — Other Ambulatory Visit: Payer: Self-pay | Admitting: Internal Medicine

## 2022-06-16 MED ORDER — RYBELSUS 7 MG PO TABS: 1.0000 | ORAL_TABLET | Freq: Every day | ORAL | 5 refills | Status: DC

## 2022-08-22 ENCOUNTER — Ambulatory Visit (INDEPENDENT_AMBULATORY_CARE_PROVIDER_SITE_OTHER): Payer: BC Managed Care – PPO | Admitting: *Deleted

## 2022-08-22 DIAGNOSIS — Z23 Encounter for immunization: Secondary | ICD-10-CM

## 2022-09-07 ENCOUNTER — Ambulatory Visit: Payer: BC Managed Care – PPO | Admitting: Internal Medicine

## 2022-09-13 ENCOUNTER — Ambulatory Visit: Payer: BC Managed Care – PPO | Admitting: Internal Medicine

## 2022-09-13 ENCOUNTER — Encounter: Payer: Self-pay | Admitting: Internal Medicine

## 2022-09-13 VITALS — BP 126/78 | HR 78 | Temp 98.0°F | Ht 72.0 in | Wt 323.0 lb

## 2022-09-13 DIAGNOSIS — M25552 Pain in left hip: Secondary | ICD-10-CM | POA: Diagnosis not present

## 2022-09-13 DIAGNOSIS — I10 Essential (primary) hypertension: Secondary | ICD-10-CM | POA: Diagnosis not present

## 2022-09-13 MED ORDER — RYBELSUS 14 MG PO TABS: 14.0000 mg | ORAL_TABLET | Freq: Every day | ORAL | 11 refills | Status: DC

## 2022-09-13 NOTE — Assessment & Plan Note (Addendum)
?  bursitis Hip X ray Info provided Use props when resting Massage, exercises

## 2022-09-13 NOTE — Progress Notes (Signed)
Subjective:  Patient ID: Randy [name], male    DOB: 01/02/1961  Age: 62 y.o. MRN: [id number]  CC: Follow-up (Sometimes having aching feeling on left hip )   HPI Randy [name] presents for L hip pain, fatigue.  Follow-up on diabetes, hypertension   Outpatient Medications Prior to Visit  Medication Sig Dispense Refill   amLODipine (NORVASC) 5 MG tablet Take 1 tablet (5 mg total) by mouth daily. 90 tablet 3   Ascorbic Acid (VITAMIN C WITH ROSE HIPS) 1000 MG tablet Take 5,000 mg by mouth daily.     aspirin EC 81 MG tablet Take 81 mg by mouth daily. Swallow whole.     baclofen (LIORESAL) 20 MG tablet Take 0.5 tablets (10 mg total) by mouth 3 (three) times daily as needed for muscle spasms. 30 each 0   benazepril (LOTENSIN) 20 MG tablet Take 1 tablet (20 mg total) by mouth daily. Annual appt due in Nov must see provider for future refills 90 tablet 3   Cholecalciferol (VITAMIN D3) 50 MCG (2000 UT) TABS Take 2,000 Units by mouth daily.      Coenzyme Q10 (COQ10) 100 MG CAPS Take 1 capsule by mouth every morning.     Cyanocobalamin (B-12) 2000 MCG TABS Take 1 tablet by mouth every morning.     Echinacea 125 MG CAPS Take by mouth.     ibuprofen (ADVIL) 200 MG tablet Take 400 mg by mouth 3 (three) times daily.      levothyroxine (SYNTHROID) 125 MCG tablet TAKE 2 TABLETS BY MOUTH ONCE DAILY BEFORE BREAKFAST 180 tablet 3   lovastatin (MEVACOR) 40 MG tablet TAKE 1 TABLET BY MOUTH AT BEDTIME 90 tablet 1   meclizine (ANTIVERT) 25 MG tablet Take 1 tablet (25 mg total) by mouth 3 (three) times daily as needed for dizziness. 30 tablet 0   Multiple Vitamin (MULTIVITAMIN) tablet Take 1 tablet by mouth every morning.     triamterene-hydrochlorothiazide (MAXZIDE-25) 37.5-25 MG tablet Take 1 tablet by mouth daily. 90 tablet 3   Turmeric 500 MG TABS Take 1 capsule by mouth every morning.     Semaglutide (RYBELSUS) 7 MG TABS Take 1 tablet by mouth daily. Take 30 min ac 30 tablet 5   No facility-administered  medications prior to visit.    ROS: Review of Systems  Constitutional:  Negative for appetite change, fatigue and unexpected weight change.  HENT:  Negative for congestion, nosebleeds, sneezing, sore throat and trouble swallowing.   Eyes:  Negative for itching and visual disturbance.  Respiratory:  Negative for cough.   Cardiovascular:  Negative for chest pain, palpitations and leg swelling.  Gastrointestinal:  Negative for abdominal distention, blood in stool, diarrhea and nausea.  Genitourinary:  Negative for frequency and hematuria.  Musculoskeletal:  Positive for gait problem. Negative for arthralgias, back pain, joint swelling and neck pain.  Skin:  Negative for rash.  Neurological:  Negative for dizziness, tremors, speech difficulty and weakness.  Psychiatric/Behavioral:  Negative for agitation, dysphoric mood and sleep disturbance. The patient is not nervous/anxious.     Objective:  BP 126/78 (BP Location: Left Arm, Patient Position: Sitting, Cuff Size: Normal)   Pulse 78   Temp 98 F (36.7 C) (Oral)   Ht 6' (1.829 m)   Wt (!) 323 lb (146.5 kg)   SpO2 97%   BMI 43.81 kg/m   BP Readings from Last 3 Encounters:  09/13/22 126/78  04/26/22 110/70  11/11/21 118/68    Wt Readings from Last  3 Encounters:  09/13/22 (!) 323 lb (146.5 kg)  04/26/22 (!) 347 lb (157.4 kg)  11/11/21 (!) 347 lb (157.4 kg)    Physical Exam Constitutional:      General: He is not in acute distress.    Appearance: He is well-developed. He is obese.     Comments: NAD  Eyes:     Conjunctiva/sclera: Conjunctivae normal.     Pupils: Pupils are equal, round, and reactive to light.  Neck:     Thyroid: No thyromegaly.     Vascular: No JVD.  Cardiovascular:     Rate and Rhythm: Normal rate and regular rhythm.     Heart sounds: Normal heart sounds. No murmur heard.    No friction rub. No gallop.  Pulmonary:     Effort: Pulmonary effort is normal. No respiratory distress.     Breath sounds:  Normal breath sounds. No wheezing or rales.  Chest:     Chest wall: No tenderness.  Abdominal:     General: Bowel sounds are normal. There is no distension.     Palpations: Abdomen is soft. There is no mass.     Tenderness: There is no abdominal tenderness. There is no guarding or rebound.  Musculoskeletal:        General: No tenderness. Normal range of motion.     Cervical back: Normal range of motion.  Lymphadenopathy:     Cervical: No cervical adenopathy.  Skin:    General: Skin is warm and dry.     Findings: No rash.  Neurological:     Mental Status: He is alert and oriented to person, place, and time.     Cranial Nerves: No cranial nerve deficit.     Motor: No abnormal muscle tone.     Coordination: Coordination normal.     Gait: Gait normal.     Deep Tendon Reflexes: Reflexes are normal and symmetric.  Psychiatric:        Behavior: Behavior normal.        Thought Content: Thought content normal.        Judgment: Judgment normal.   Left she palpation with full range of motion.  It is nontender, however, eliciting the pressure symptoms is difficult due to subcutaneous fat layer Straight leg elevation is negative bilaterally  Lab Results  Component Value Date   WBC 5.3 12/23/2021   HGB 14.4 12/23/2021   HCT 42.2 12/23/2021   PLT 238.0 12/23/2021   GLUCOSE 102 (H) 12/23/2021   CHOL 132 12/23/2021   TRIG 81.0 12/23/2021   HDL 35.00 (L) 12/23/2021   LDLCALC 80 12/23/2021   ALT 24 12/23/2021   AST 16 12/23/2021   NA 138 12/23/2021   K 4.1 12/23/2021   CL 103 12/23/2021   CREATININE 1.42 12/23/2021   BUN 20 12/23/2021   CO2 26 12/23/2021   TSH 5.22 12/23/2021   PSA 0.58 12/23/2021   INR 1.0 04/26/2020   HGBA1C 5.8 12/23/2021    MR BRAIN WO CONTRAST  Result Date: 04/26/2020 CLINICAL DATA:  Initial evaluation for acute vertigo. EXAM: MRI HEAD WITHOUT CONTRAST TECHNIQUE: Multiplanar, multiecho pulse sequences of the brain and surrounding structures were obtained  without intravenous contrast. COMPARISON:  Prior head CT from earlier the same day. FINDINGS: Brain: Cerebral volume within normal limits for patient age. Few scattered punctate foci of T2/FLAIR hyperintensity noted involving the supratentorial cerebral white matter, nonspecific, but felt to be within normal limits for age. No abnormal foci of restricted diffusion to suggest  acute or subacute ischemia. Gray-white matter differentiation well maintained. No encephalomalacia to suggest chronic infarction. No foci of susceptibility artifact to suggest acute or chronic intracranial hemorrhage. No mass lesion, midline shift or mass effect. No hydrocephalus. No extra-axial fluid collection. Major dural sinuses are grossly patent. Pituitary gland and suprasellar region are normal. Midline structures intact and normal. Vascular: Major intracranial vascular flow voids well maintained and normal in appearance. Skull and upper cervical spine: Craniocervical junction normal. Visualized upper cervical spine within normal limits. Bone marrow signal intensity normal. No scalp soft tissue abnormality. Sinuses/Orbits: Globes and orbital soft tissues within normal limits. Scattered mucosal thickening noted within the frontoethmoidal sinuses. Paranasal sinuses are otherwise clear. No mastoid effusion. Inner ear structures normal. Other: None. IMPRESSION: Normal brain MRI for age. No acute intracranial abnormality identified. Electronically Signed   By: Jeannine Boga M.D.   On: 04/26/2020 21:46   CT HEAD WO CONTRAST  Result Date: 04/26/2020 CLINICAL DATA:  Dizziness with nausea and vomiting beginning this morning. EXAM: CT HEAD WITHOUT CONTRAST TECHNIQUE: Contiguous axial images were obtained from the base of the skull through the vertex without intravenous contrast. COMPARISON:  None. FINDINGS: Brain: Ventricles, cisterns and other CSF spaces are normal. There is no mass, mass effect, shift of midline structures or acute  hemorrhage. No evidence of acute infarction. Vascular: No hyperdense vessel or unexpected calcification. Skull: Normal. Negative for fracture or focal lesion. Sinuses/Orbits: Minimal opacification over the left frontal sinus and anterior ethmoid air cells. Paranasal sinuses are otherwise unremarkable. Orbits are normal. Other: None. IMPRESSION: 1. No acute findings. 2. Minimal chronic sinus inflammatory change. Electronically Signed   By: Marin Olp M.D.   On: 04/26/2020 16:45    Assessment & Plan:   Problem List Items Addressed This Visit     OBESITY, MORBID    Doing better on the Rybelsus Wt Readings from Last 3 Encounters:  09/13/22 (!) 323 lb (146.5 kg)  04/26/22 (!) 347 lb (157.4 kg)  11/11/21 (!) 347 lb (157.4 kg)  Increase Rybelsus dose       Relevant Medications   Semaglutide (RYBELSUS) 14 MG TABS   Hip pain, left - Primary    ?bursitis Hip X ray Info provided Use props when resting Massage, exercises      Relevant Orders   XR HIP UNILAT W OR W/O PELVIS 2-3 VIEWS LEFT   Essential hypertension    Continue with weight loss.  Continue with current therapy: Amlodipine, benazepril, Maxide         Meds ordered this encounter  Medications   Semaglutide (RYBELSUS) 14 MG TABS    Sig: Take 1 tablet (14 mg total) by mouth daily.    Dispense:  30 tablet    Refill:  11      Follow-up: Return in about 4 months (around 01/12/2023) for a follow-up visit.  Walker Kehr, MD

## 2022-09-14 NOTE — Assessment & Plan Note (Signed)
Continue with weight loss.  Continue with current therapy: Amlodipine, benazepril, Maxide

## 2022-09-14 NOTE — Assessment & Plan Note (Signed)
Doing better on the Rybelsus Wt Readings from Last 3 Encounters:  09/13/22 (!) 323 lb (146.5 kg)  04/26/22 (!) 347 lb (157.4 kg)  11/11/21 (!) 347 lb (157.4 kg)  Increase Rybelsus dose

## 2022-12-10 ENCOUNTER — Other Ambulatory Visit: Payer: Self-pay | Admitting: Internal Medicine

## 2023-01-11 ENCOUNTER — Ambulatory Visit: Payer: BC Managed Care – PPO | Admitting: Internal Medicine

## 2023-01-23 ENCOUNTER — Ambulatory Visit: Payer: BC Managed Care – PPO | Admitting: Internal Medicine

## 2023-01-23 ENCOUNTER — Encounter: Payer: Self-pay | Admitting: Internal Medicine

## 2023-01-23 VITALS — BP 114/78 | HR 107 | Temp 99.0°F | Ht 72.0 in | Wt 313.0 lb

## 2023-01-23 DIAGNOSIS — M5441 Lumbago with sciatica, right side: Secondary | ICD-10-CM

## 2023-01-23 DIAGNOSIS — Z0000 Encounter for general adult medical examination without abnormal findings: Secondary | ICD-10-CM

## 2023-01-23 DIAGNOSIS — E669 Obesity, unspecified: Secondary | ICD-10-CM | POA: Insufficient documentation

## 2023-01-23 DIAGNOSIS — E119 Type 2 diabetes mellitus without complications: Secondary | ICD-10-CM | POA: Insufficient documentation

## 2023-01-23 DIAGNOSIS — E1169 Type 2 diabetes mellitus with other specified complication: Secondary | ICD-10-CM

## 2023-01-23 DIAGNOSIS — E291 Testicular hypofunction: Secondary | ICD-10-CM

## 2023-01-23 DIAGNOSIS — E039 Hypothyroidism, unspecified: Secondary | ICD-10-CM

## 2023-01-23 DIAGNOSIS — Z7984 Long term (current) use of oral hypoglycemic drugs: Secondary | ICD-10-CM

## 2023-01-23 NOTE — Assessment & Plan Note (Signed)
On Rybelsus 

## 2023-01-23 NOTE — Assessment & Plan Note (Signed)
Check testosterone. 

## 2023-01-23 NOTE — Assessment & Plan Note (Signed)
No LBP 

## 2023-01-23 NOTE — Assessment & Plan Note (Signed)
Chronic On Levothroid - 

## 2023-01-23 NOTE — Progress Notes (Addendum)
Subjective:  Patient ID: Randy [name], male    DOB: 11/05/1960  Age: 62 y.o. MRN: [id number]  CC: No chief complaint on file.   HPI IASON DANH presents for HTN, DM, B12 def   Outpatient Medications Prior to Visit  Medication Sig Dispense Refill   amLODipine (NORVASC) 5 MG tablet Take 1 tablet (5 mg total) by mouth daily. Follow-up appt due in May must see provider for future refills 90 tablet 0   Ascorbic Acid (VITAMIN C WITH ROSE HIPS) 1000 MG tablet Take 5,000 mg by mouth daily.     aspirin EC 81 MG tablet Take 81 mg by mouth daily. Swallow whole.     baclofen (LIORESAL) 20 MG tablet Take 0.5 tablets (10 mg total) by mouth 3 (three) times daily as needed for muscle spasms. 30 each 0   benazepril (LOTENSIN) 20 MG tablet Take 1 tablet (20 mg total) by mouth daily. Follow-up appt due in May must see provider for future refills 90 tablet 0   Cholecalciferol (VITAMIN D3) 50 MCG (2000 UT) TABS Take 2,000 Units by mouth daily.      Coenzyme Q10 (COQ10) 100 MG CAPS Take 1 capsule by mouth every morning.     Cyanocobalamin (B-12) 2000 MCG TABS Take 1 tablet by mouth every morning.     Echinacea 125 MG CAPS Take by mouth.     ibuprofen (ADVIL) 200 MG tablet Take 400 mg by mouth 3 (three) times daily.      levothyroxine (SYNTHROID) 125 MCG tablet TAKE 2 TABLETS BY MOUTH ONCE DAILY BEFORE BREAKFAST . Follow-up appt due in May must see provider for future refills 180 tablet 0   lovastatin (MEVACOR) 40 MG tablet Take 1 tablet (40 mg total) by mouth at bedtime. Follow-up appt due in May must see provider for future refills 90 tablet 0   meclizine (ANTIVERT) 25 MG tablet Take 1 tablet (25 mg total) by mouth 3 (three) times daily as needed for dizziness. 30 tablet 0   Multiple Vitamin (MULTIVITAMIN) tablet Take 1 tablet by mouth every morning.     Semaglutide (RYBELSUS) 14 MG TABS Take 1 tablet (14 mg total) by mouth daily. 30 tablet 11   triamterene-hydrochlorothiazide (MAXZIDE-25) 37.5-25 MG  tablet Take 1 tablet by mouth daily. Follow-up appt due in May must see provider for future refills 90 tablet 0   Turmeric 500 MG TABS Take 1 capsule by mouth every morning.     No facility-administered medications prior to visit.    ROS: Review of Systems  Constitutional:  Negative for appetite change, fatigue and unexpected weight change.  HENT:  Negative for congestion, nosebleeds, sneezing, sore throat and trouble swallowing.   Eyes:  Negative for itching and visual disturbance.  Respiratory:  Negative for cough.   Cardiovascular:  Negative for chest pain, palpitations and leg swelling.  Gastrointestinal:  Negative for abdominal distention, blood in stool, diarrhea and nausea.  Genitourinary:  Negative for frequency and hematuria.  Musculoskeletal:  Negative for back pain, gait problem, joint swelling and neck pain.  Skin:  Negative for rash.  Neurological:  Negative for dizziness, tremors, speech difficulty and weakness.  Psychiatric/Behavioral:  Negative for agitation, dysphoric mood and sleep disturbance. The patient is not nervous/anxious.     Objective:  BP 114/78 (BP Location: Left Arm, Patient Position: Sitting, Cuff Size: Normal)   Pulse (!) 107   Temp 99 F (37.2 C) (Oral)   Ht 6' (1.829 m)   Wt (!) 313  lb (142 kg)   SpO2 96%   BMI 42.45 kg/m   BP Readings from Last 3 Encounters:  01/23/23 114/78  09/13/22 126/78  04/26/22 110/70    Wt Readings from Last 3 Encounters:  01/23/23 (!) 313 lb (142 kg)  09/13/22 (!) 323 lb (146.5 kg)  04/26/22 (!) 347 lb (157.4 kg)    Physical Exam Constitutional:      General: He is not in acute distress.    Appearance: He is well-developed. He is obese.     Comments: NAD  Eyes:     Conjunctiva/sclera: Conjunctivae normal.     Pupils: Pupils are equal, round, and reactive to light.  Neck:     Thyroid: No thyromegaly.     Vascular: No JVD.  Cardiovascular:     Rate and Rhythm: Normal rate and regular rhythm.     Heart  sounds: Normal heart sounds. No murmur heard.    No friction rub. No gallop.  Pulmonary:     Effort: Pulmonary effort is normal. No respiratory distress.     Breath sounds: Normal breath sounds. No wheezing or rales.  Chest:     Chest wall: No tenderness.  Abdominal:     General: Bowel sounds are normal. There is no distension.     Palpations: Abdomen is soft. There is no mass.     Tenderness: There is no abdominal tenderness. There is no guarding or rebound.  Musculoskeletal:        General: No tenderness. Normal range of motion.     Cervical back: Normal range of motion.  Lymphadenopathy:     Cervical: No cervical adenopathy.  Skin:    General: Skin is warm and dry.     Findings: No rash.  Neurological:     Mental Status: He is alert and oriented to person, place, and time.     Cranial Nerves: No cranial nerve deficit.     Motor: No abnormal muscle tone.     Coordination: Coordination normal.     Gait: Gait normal.     Deep Tendon Reflexes: Reflexes are normal and symmetric.  Psychiatric:        Behavior: Behavior normal.        Thought Content: Thought content normal.        Judgment: Judgment normal.     Lab Results  Component Value Date   WBC 5.3 12/23/2021   HGB 14.4 12/23/2021   HCT 42.2 12/23/2021   PLT 238.0 12/23/2021   GLUCOSE 102 (H) 12/23/2021   CHOL 132 12/23/2021   TRIG 81.0 12/23/2021   HDL 35.00 (L) 12/23/2021   LDLCALC 80 12/23/2021   ALT 24 12/23/2021   AST 16 12/23/2021   NA 138 12/23/2021   K 4.1 12/23/2021   CL 103 12/23/2021   CREATININE 1.42 12/23/2021   BUN 20 12/23/2021   CO2 26 12/23/2021   TSH 5.22 12/23/2021   PSA 0.58 12/23/2021   INR 1.0 04/26/2020   HGBA1C 5.8 12/23/2021    MR BRAIN WO CONTRAST  Result Date: 04/26/2020 CLINICAL DATA:  Initial evaluation for acute vertigo. EXAM: MRI HEAD WITHOUT CONTRAST TECHNIQUE: Multiplanar, multiecho pulse sequences of the brain and surrounding structures were obtained without intravenous  contrast. COMPARISON:  Prior head CT from earlier the same day. FINDINGS: Brain: Cerebral volume within normal limits for patient age. Few scattered punctate foci of T2/FLAIR hyperintensity noted involving the supratentorial cerebral white matter, nonspecific, but felt to be within normal limits for age. No  abnormal foci of restricted diffusion to suggest acute or subacute ischemia. Gray-white matter differentiation well maintained. No encephalomalacia to suggest chronic infarction. No foci of susceptibility artifact to suggest acute or chronic intracranial hemorrhage. No mass lesion, midline shift or mass effect. No hydrocephalus. No extra-axial fluid collection. Major dural sinuses are grossly patent. Pituitary gland and suprasellar region are normal. Midline structures intact and normal. Vascular: Major intracranial vascular flow voids well maintained and normal in appearance. Skull and upper cervical spine: Craniocervical junction normal. Visualized upper cervical spine within normal limits. Bone marrow signal intensity normal. No scalp soft tissue abnormality. Sinuses/Orbits: Globes and orbital soft tissues within normal limits. Scattered mucosal thickening noted within the frontoethmoidal sinuses. Paranasal sinuses are otherwise clear. No mastoid effusion. Inner ear structures normal. Other: None. IMPRESSION: Normal brain MRI for age. No acute intracranial abnormality identified. Electronically Signed   By: Rise Mu M.D.   On: 04/26/2020 21:46   CT HEAD WO CONTRAST  Result Date: 04/26/2020 CLINICAL DATA:  Dizziness with nausea and vomiting beginning this morning. EXAM: CT HEAD WITHOUT CONTRAST TECHNIQUE: Contiguous axial images were obtained from the base of the skull through the vertex without intravenous contrast. COMPARISON:  None. FINDINGS: Brain: Ventricles, cisterns and other CSF spaces are normal. There is no mass, mass effect, shift of midline structures or acute hemorrhage. No evidence  of acute infarction. Vascular: No hyperdense vessel or unexpected calcification. Skull: Normal. Negative for fracture or focal lesion. Sinuses/Orbits: Minimal opacification over the left frontal sinus and anterior ethmoid air cells. Paranasal sinuses are otherwise unremarkable. Orbits are normal. Other: None. IMPRESSION: 1. No acute findings. 2. Minimal chronic sinus inflammatory change. Electronically Signed   By: Elberta Fortis M.D.   On: 04/26/2020 16:45    Assessment & Plan:   Problem List Items Addressed This Visit     Hypothyroidism    Chronic  On Levothroid      Relevant Orders   TSH   Urinalysis   CBC with Differential/Platelet   Lipid panel   PSA   Comprehensive metabolic panel   Testosterone   Hemoglobin A1c   Microalbumin / creatinine urine ratio   Hypogonadism in male    Check testosterone      Relevant Orders   TSH   Urinalysis   CBC with Differential/Platelet   Lipid panel   PSA   Comprehensive metabolic panel   Testosterone   Hemoglobin A1c   Microalbumin / creatinine urine ratio   Well adult exam    Colonoscopy advised in the view of the h/o polyp      Relevant Orders   TSH   Urinalysis   CBC with Differential/Platelet   Lipid panel   PSA   Comprehensive metabolic panel   Testosterone   Hemoglobin A1c   Microalbumin / creatinine urine ratio   Low back pain    No LBP      Relevant Orders   TSH   Urinalysis   CBC with Differential/Platelet   Lipid panel   PSA   Comprehensive metabolic panel   Testosterone   Hemoglobin A1c   Microalbumin / creatinine urine ratio   Type 2 diabetes mellitus with obesity (HCC) - Primary    On Rybelsus      Relevant Orders   TSH   Urinalysis   CBC with Differential/Platelet   Lipid panel   PSA   Comprehensive metabolic panel   Testosterone   Hemoglobin A1c   Microalbumin / creatinine urine ratio  No orders of the defined types were placed in this encounter.     Follow-up: Return in about  4 months (around 05/26/2023) for a follow-up visit.  Sonda Primes, MD

## 2023-01-23 NOTE — Assessment & Plan Note (Signed)
Colonoscopy advised in the view of the h/o polyp

## 2023-02-08 ENCOUNTER — Other Ambulatory Visit (INDEPENDENT_AMBULATORY_CARE_PROVIDER_SITE_OTHER): Payer: BC Managed Care – PPO

## 2023-02-08 DIAGNOSIS — M5441 Lumbago with sciatica, right side: Secondary | ICD-10-CM

## 2023-02-08 DIAGNOSIS — E669 Obesity, unspecified: Secondary | ICD-10-CM

## 2023-02-08 DIAGNOSIS — E291 Testicular hypofunction: Secondary | ICD-10-CM

## 2023-02-08 DIAGNOSIS — E039 Hypothyroidism, unspecified: Secondary | ICD-10-CM | POA: Diagnosis not present

## 2023-02-08 DIAGNOSIS — E1169 Type 2 diabetes mellitus with other specified complication: Secondary | ICD-10-CM

## 2023-02-08 DIAGNOSIS — Z0000 Encounter for general adult medical examination without abnormal findings: Secondary | ICD-10-CM

## 2023-02-08 LAB — COMPREHENSIVE METABOLIC PANEL
ALT: 22 U/L (ref 0–53)
AST: 14 U/L (ref 0–37)
Albumin: 4 g/dL (ref 3.5–5.2)
Alkaline Phosphatase: 47 U/L (ref 39–117)
BUN: 19 mg/dL (ref 6–23)
CO2: 26 mEq/L (ref 19–32)
Calcium: 9.2 mg/dL (ref 8.4–10.5)
Chloride: 103 mEq/L (ref 96–112)
Creatinine, Ser: 1.35 mg/dL (ref 0.40–1.50)
GFR: 56.08 mL/min — ABNORMAL LOW (ref >60.00)
Glucose, Bld: 90 mg/dL (ref 70–99)
Potassium: 3.8 mEq/L (ref 3.5–5.1)
Sodium: 138 mEq/L (ref 135–145)
Total Bilirubin: 0.9 mg/dL (ref 0.2–1.2)
Total Protein: 6.5 g/dL (ref 6.0–8.3)

## 2023-02-08 LAB — CBC WITH DIFFERENTIAL/PLATELET
Basophils Absolute: 0 10*3/uL (ref 0.0–0.1)
Basophils Relative: 1 % (ref 0.0–3.0)
Eosinophils Absolute: 0.1 10*3/uL (ref 0.0–0.7)
Eosinophils Relative: 3.1 % (ref 0.0–5.0)
HCT: 43.4 % (ref 39.0–52.0)
Hemoglobin: 14.5 g/dL (ref 13.0–17.0)
Lymphocytes Relative: 28.3 % (ref 12.0–46.0)
Lymphs Abs: 1.4 10*3/uL (ref 0.7–4.0)
MCHC: 33.4 g/dL (ref 30.0–36.0)
MCV: 90.7 fl (ref 78.0–100.0)
Monocytes Absolute: 0.5 10*3/uL (ref 0.1–1.0)
Monocytes Relative: 10.7 % (ref 3.0–12.0)
Neutro Abs: 2.7 10*3/uL (ref 1.4–7.7)
Neutrophils Relative %: 56.9 % (ref 43.0–77.0)
Platelets: 274 10*3/uL (ref 150.0–400.0)
RBC: 4.79 Mil/uL (ref 4.22–5.81)
RDW: 13.3 % (ref 11.5–15.5)
WBC: 4.8 10*3/uL (ref 4.0–10.5)

## 2023-02-08 LAB — URINALYSIS
Bilirubin Urine: NEGATIVE
Hgb urine dipstick: NEGATIVE
Leukocytes,Ua: NEGATIVE
Nitrite: NEGATIVE
Specific Gravity, Urine: 1.02 (ref 1.000–1.030)
Urine Glucose: NEGATIVE
Urobilinogen, UA: 0.2 (ref 0.0–1.0)
pH: 6.5 (ref 5.0–8.0)

## 2023-02-08 LAB — LIPID PANEL
Cholesterol: 110 mg/dL (ref 0–200)
HDL: 32.5 mg/dL — ABNORMAL LOW (ref >39.00)
LDL Cholesterol: 64 mg/dL (ref 0–99)
NonHDL: 77.36
Total CHOL/HDL Ratio: 3
Triglycerides: 67 mg/dL (ref 0.0–149.0)
VLDL: 13.4 mg/dL (ref 0.0–40.0)

## 2023-02-08 LAB — PSA: PSA: 1.09 ng/mL (ref 0.10–4.00)

## 2023-02-08 LAB — MICROALBUMIN / CREATININE URINE RATIO
Creatinine,U: 373.5 mg/dL
Microalb Creat Ratio: 1.3 mg/g (ref 0.0–30.0)
Microalb, Ur: 4.9 mg/dL — ABNORMAL HIGH (ref 0.0–1.9)

## 2023-02-08 LAB — TESTOSTERONE: Testosterone: 356.5 ng/dL (ref 300.00–890.00)

## 2023-02-08 LAB — TSH: TSH: 0.17 u[IU]/mL — ABNORMAL LOW (ref 0.35–5.50)

## 2023-02-08 LAB — HEMOGLOBIN A1C: Hgb A1c MFr Bld: 5.3 % (ref 4.6–6.5)

## 2023-03-14 ENCOUNTER — Other Ambulatory Visit: Payer: Self-pay | Admitting: Internal Medicine

## 2023-04-27 ENCOUNTER — Encounter (INDEPENDENT_AMBULATORY_CARE_PROVIDER_SITE_OTHER): Payer: Self-pay

## 2023-05-04 ENCOUNTER — Telehealth: Payer: Self-pay | Admitting: Internal Medicine

## 2023-05-04 DIAGNOSIS — E785 Hyperlipidemia, unspecified: Secondary | ICD-10-CM

## 2023-05-04 NOTE — Telephone Encounter (Signed)
Pt called stating the Ct referral that was sent that location is currently close due to they is moving can we send a referral to another location and please update pt.

## 2023-05-04 NOTE — Telephone Encounter (Signed)
No updated referral in epic.Marland KitchenRaechel Chute

## 2023-05-05 NOTE — Telephone Encounter (Signed)
Noted.  Will resend.  Thanks

## 2023-05-12 ENCOUNTER — Encounter: Payer: Self-pay | Admitting: Internal Medicine

## 2023-05-17 ENCOUNTER — Other Ambulatory Visit: Payer: Self-pay

## 2023-05-17 ENCOUNTER — Ambulatory Visit: Payer: BC Managed Care – PPO | Admitting: Family Medicine

## 2023-05-17 ENCOUNTER — Emergency Department (HOSPITAL_BASED_OUTPATIENT_CLINIC_OR_DEPARTMENT_OTHER): Payer: BC Managed Care – PPO

## 2023-05-17 ENCOUNTER — Ambulatory Visit: Payer: BC Managed Care – PPO | Admitting: Internal Medicine

## 2023-05-17 ENCOUNTER — Inpatient Hospital Stay (HOSPITAL_BASED_OUTPATIENT_CLINIC_OR_DEPARTMENT_OTHER)
Admission: EM | Admit: 2023-05-17 | Discharge: 2023-05-21 | DRG: 872 | Disposition: A | Discharge status: Home / Self Care | Payer: BC Managed Care – PPO | Attending: Internal Medicine | Admitting: Internal Medicine

## 2023-05-17 ENCOUNTER — Encounter (HOSPITAL_BASED_OUTPATIENT_CLINIC_OR_DEPARTMENT_OTHER): Payer: Self-pay | Admitting: Pediatrics

## 2023-05-17 VITALS — BP 122/80 | HR 64 | Temp 97.6°F | Resp 18 | Ht 72.0 in | Wt 322.2 lb

## 2023-05-17 DIAGNOSIS — R000 Tachycardia, unspecified: Secondary | ICD-10-CM

## 2023-05-17 DIAGNOSIS — Z8349 Family history of other endocrine, nutritional and metabolic diseases: Secondary | ICD-10-CM | POA: Diagnosis not present

## 2023-05-17 DIAGNOSIS — Z825 Family history of asthma and other chronic lower respiratory diseases: Secondary | ICD-10-CM | POA: Diagnosis not present

## 2023-05-17 DIAGNOSIS — R030 Elevated blood-pressure reading, without diagnosis of hypertension: Secondary | ICD-10-CM | POA: Diagnosis present

## 2023-05-17 DIAGNOSIS — Z79899 Other long term (current) drug therapy: Secondary | ICD-10-CM

## 2023-05-17 DIAGNOSIS — E559 Vitamin D deficiency, unspecified: Secondary | ICD-10-CM | POA: Diagnosis present

## 2023-05-17 DIAGNOSIS — A419 Sepsis, unspecified organism: Secondary | ICD-10-CM | POA: Diagnosis present

## 2023-05-17 DIAGNOSIS — E890 Postprocedural hypothyroidism: Secondary | ICD-10-CM | POA: Diagnosis present

## 2023-05-17 DIAGNOSIS — Z880 Allergy status to penicillin: Secondary | ICD-10-CM

## 2023-05-17 DIAGNOSIS — Z79890 Hormone replacement therapy (postmenopausal): Secondary | ICD-10-CM

## 2023-05-17 DIAGNOSIS — Z7982 Long term (current) use of aspirin: Secondary | ICD-10-CM | POA: Diagnosis not present

## 2023-05-17 DIAGNOSIS — Z83438 Family history of other disorder of lipoprotein metabolism and other lipidemia: Secondary | ICD-10-CM | POA: Diagnosis not present

## 2023-05-17 DIAGNOSIS — Z818 Family history of other mental and behavioral disorders: Secondary | ICD-10-CM

## 2023-05-17 DIAGNOSIS — Z8261 Family history of arthritis: Secondary | ICD-10-CM | POA: Diagnosis not present

## 2023-05-17 DIAGNOSIS — E039 Hypothyroidism, unspecified: Secondary | ICD-10-CM | POA: Diagnosis not present

## 2023-05-17 DIAGNOSIS — E876 Hypokalemia: Secondary | ICD-10-CM | POA: Diagnosis present

## 2023-05-17 DIAGNOSIS — I480 Paroxysmal atrial fibrillation: Secondary | ICD-10-CM | POA: Diagnosis present

## 2023-05-17 DIAGNOSIS — I4891 Unspecified atrial fibrillation: Secondary | ICD-10-CM

## 2023-05-17 DIAGNOSIS — I10 Essential (primary) hypertension: Secondary | ICD-10-CM | POA: Diagnosis present

## 2023-05-17 DIAGNOSIS — E782 Mixed hyperlipidemia: Secondary | ICD-10-CM | POA: Diagnosis present

## 2023-05-17 DIAGNOSIS — L03116 Cellulitis of left lower limb: Secondary | ICD-10-CM | POA: Diagnosis present

## 2023-05-17 DIAGNOSIS — E871 Hypo-osmolality and hyponatremia: Secondary | ICD-10-CM | POA: Diagnosis present

## 2023-05-17 DIAGNOSIS — M7989 Other specified soft tissue disorders: Secondary | ICD-10-CM | POA: Diagnosis present

## 2023-05-17 DIAGNOSIS — L0390 Cellulitis, unspecified: Principal | ICD-10-CM | POA: Diagnosis present

## 2023-05-17 DIAGNOSIS — E119 Type 2 diabetes mellitus without complications: Secondary | ICD-10-CM

## 2023-05-17 DIAGNOSIS — Z6841 Body Mass Index (BMI) 40.0 and over, adult: Secondary | ICD-10-CM | POA: Diagnosis not present

## 2023-05-17 DIAGNOSIS — Z8249 Family history of ischemic heart disease and other diseases of the circulatory system: Secondary | ICD-10-CM

## 2023-05-17 DIAGNOSIS — R6520 Severe sepsis without septic shock: Secondary | ICD-10-CM | POA: Diagnosis not present

## 2023-05-17 LAB — COMPREHENSIVE METABOLIC PANEL
ALT: 33 U/L (ref 0–44)
AST: 23 U/L (ref 15–41)
Albumin: 4 g/dL (ref 3.5–5.0)
Alkaline Phosphatase: 45 U/L (ref 38–126)
Anion gap: 11 (ref 5–15)
BUN: 23 mg/dL (ref 8–23)
CO2: 24 mmol/L (ref 22–32)
Calcium: 8.7 mg/dL — ABNORMAL LOW (ref 8.9–10.3)
Chloride: 98 mmol/L (ref 98–111)
Creatinine, Ser: 1.31 mg/dL — ABNORMAL HIGH (ref 0.61–1.24)
GFR, Estimated: >60 mL/min (ref >60)
Glucose, Bld: 96 mg/dL (ref 70–99)
Potassium: 3.4 mmol/L — ABNORMAL LOW (ref 3.5–5.1)
Sodium: 133 mmol/L — ABNORMAL LOW (ref 135–145)
Total Bilirubin: 1.1 mg/dL (ref 0.3–1.2)
Total Protein: 6.9 g/dL (ref 6.5–8.1)

## 2023-05-17 LAB — PROTIME-INR
INR: 1.2 (ref 0.8–1.2)
Prothrombin Time: 15.5 s — ABNORMAL HIGH (ref 11.4–15.2)

## 2023-05-17 LAB — CBC WITH DIFFERENTIAL/PLATELET
Abs Immature Granulocytes: 0.01 10*3/uL (ref 0.00–0.07)
Basophils Absolute: 0 10*3/uL (ref 0.0–0.1)
Basophils Relative: 1 %
Eosinophils Absolute: 0 10*3/uL (ref 0.0–0.5)
Eosinophils Relative: 0 %
HCT: 38.4 % — ABNORMAL LOW (ref 39.0–52.0)
Hemoglobin: 12.9 g/dL — ABNORMAL LOW (ref 13.0–17.0)
Immature Granulocytes: 0 %
Lymphocytes Relative: 12 %
Lymphs Abs: 0.8 10*3/uL (ref 0.7–4.0)
MCH: 30.4 pg (ref 26.0–34.0)
MCHC: 33.6 g/dL (ref 30.0–36.0)
MCV: 90.4 fL (ref 80.0–100.0)
Monocytes Absolute: 0.5 10*3/uL (ref 0.1–1.0)
Monocytes Relative: 9 %
Neutro Abs: 5 10*3/uL (ref 1.7–7.7)
Neutrophils Relative %: 78 %
Platelets: 181 10*3/uL (ref 150–400)
RBC: 4.25 MIL/uL (ref 4.22–5.81)
RDW: 13.3 % (ref 11.5–15.5)
WBC: 6.4 10*3/uL (ref 4.0–10.5)
nRBC: 0 % (ref 0.0–0.2)

## 2023-05-17 LAB — LACTIC ACID, PLASMA
Lactic Acid, Venous: 1.6 mmol/L (ref 0.5–1.9)
Lactic Acid, Venous: 0.8 mmol/L (ref 0.5–1.9)

## 2023-05-17 MED ORDER — HEPARIN BOLUS VIA INFUSION
4000.0000 [IU] | Freq: Once | INTRAVENOUS | Status: AC
  Administered 2023-05-17: 4000 [IU] via INTRAVENOUS
  Filled 2023-05-17: qty 4000

## 2023-05-17 MED ORDER — HEPARIN (PORCINE) 25000 UT/250ML-% IV SOLN
1900.0000 [IU]/h | INTRAVENOUS | Status: DC
  Administered 2023-05-17: 1700 [IU]/h via INTRAVENOUS
  Administered 2023-05-18: 1900 [IU]/h via INTRAVENOUS
  Filled 2023-05-17 (×2): qty 250

## 2023-05-17 MED ORDER — VITAMIN B-12 1000 MCG PO TABS
2000.0000 ug | ORAL_TABLET | Freq: Every morning | ORAL | Status: DC
  Administered 2023-05-18 – 2023-05-21 (×4): 2000 ug via ORAL
  Filled 2023-05-17 (×4): qty 2

## 2023-05-17 MED ORDER — VANCOMYCIN HCL 2000 MG/400ML IV SOLN
2000.0000 mg | INTRAVENOUS | Status: DC
  Administered 2023-05-18 – 2023-05-20 (×3): 2000 mg via INTRAVENOUS
  Filled 2023-05-17 (×3): qty 400

## 2023-05-17 MED ORDER — PIPERACILLIN-TAZOBACTAM 3.375 G IVPB 30 MIN
3.3750 g | Freq: Once | INTRAVENOUS | Status: AC
  Administered 2023-05-17: 3.375 g via INTRAVENOUS
  Filled 2023-05-17: qty 50

## 2023-05-17 MED ORDER — LEVOTHYROXINE SODIUM 125 MCG PO TABS
250.0000 ug | ORAL_TABLET | Freq: Every day | ORAL | Status: DC
  Administered 2023-05-18 – 2023-05-21 (×4): 250 ug via ORAL
  Filled 2023-05-17 (×4): qty 2

## 2023-05-17 MED ORDER — TURMERIC 500 MG PO CAPS: 500.0000 | ORAL_CAPSULE | Freq: Every evening | ORAL | Status: DC

## 2023-05-17 MED ORDER — COQ10 100 MG PO CAPS: 100.0000 mg | ORAL_CAPSULE | Freq: Every morning | ORAL | Status: DC

## 2023-05-17 MED ORDER — PIPERACILLIN-TAZOBACTAM 3.375 G IVPB
3.3750 g | Freq: Three times a day (TID) | INTRAVENOUS | Status: DC
  Administered 2023-05-17 – 2023-05-21 (×11): 3.375 g via INTRAVENOUS
  Filled 2023-05-17 (×11): qty 50

## 2023-05-17 MED ORDER — IBUPROFEN 200 MG PO TABS
400.0000 mg | ORAL_TABLET | Freq: Two times a day (BID) | ORAL | Status: DC
  Administered 2023-05-17 – 2023-05-21 (×8): 400 mg via ORAL
  Filled 2023-05-17 (×8): qty 2

## 2023-05-17 MED ORDER — CLINDAMYCIN PHOSPHATE 600 MG/50ML IV SOLN
600.0000 mg | Freq: Once | INTRAVENOUS | Status: AC
  Administered 2023-05-17: 600 mg via INTRAVENOUS
  Filled 2023-05-17: qty 50

## 2023-05-17 MED ORDER — ASPIRIN 81 MG PO TBEC
81.0000 mg | DELAYED_RELEASE_TABLET | Freq: Every day | ORAL | Status: DC
  Administered 2023-05-18 – 2023-05-19 (×2): 81 mg via ORAL
  Filled 2023-05-17 (×2): qty 1

## 2023-05-17 MED ORDER — PRAVASTATIN SODIUM 40 MG PO TABS
40.0000 mg | ORAL_TABLET | Freq: Every day | ORAL | Status: DC
  Administered 2023-05-18 – 2023-05-20 (×3): 40 mg via ORAL
  Filled 2023-05-17 (×3): qty 1

## 2023-05-17 MED ORDER — ACETAMINOPHEN 325 MG PO TABS: 650.0000 mg | ORAL_TABLET | Freq: Four times a day (QID) | ORAL | Status: DC | PRN

## 2023-05-17 MED ORDER — VANCOMYCIN HCL 1500 MG/300ML IV SOLN
1500.0000 mg | Freq: Once | INTRAVENOUS | Status: AC
  Administered 2023-05-18: 1500 mg via INTRAVENOUS
  Filled 2023-05-17: qty 300

## 2023-05-17 MED ORDER — SODIUM CHLORIDE 0.9 % IV BOLUS
1000.0000 mL | Freq: Once | INTRAVENOUS | Status: AC
  Administered 2023-05-17: 1000 mL via INTRAVENOUS

## 2023-05-17 MED ORDER — VANCOMYCIN HCL IN DEXTROSE 1-5 GM/200ML-% IV SOLN
1000.0000 mg | Freq: Once | INTRAVENOUS | Status: AC
  Administered 2023-05-17: 1000 mg via INTRAVENOUS
  Filled 2023-05-17: qty 200

## 2023-05-17 MED ORDER — ACETAMINOPHEN 500 MG PO TABS
1000.0000 mg | ORAL_TABLET | Freq: Once | ORAL | Status: AC
  Administered 2023-05-17: 1000 mg via ORAL
  Filled 2023-05-17: qty 2

## 2023-05-17 MED ORDER — SODIUM CHLORIDE 0.9 % IV SOLN: INTRAVENOUS | Status: DC | PRN

## 2023-05-17 MED ORDER — VITAMIN C 500 MG PO TABS
500.0000 mg | ORAL_TABLET | Freq: Every day | ORAL | Status: DC
  Administered 2023-05-18 – 2023-05-20 (×3): 500 mg via ORAL
  Filled 2023-05-17 (×3): qty 1

## 2023-05-17 MED ORDER — POTASSIUM CHLORIDE CRYS ER 20 MEQ PO TBCR
40.0000 meq | EXTENDED_RELEASE_TABLET | Freq: Once | ORAL | Status: AC
  Administered 2023-05-17: 40 meq via ORAL
  Filled 2023-05-17: qty 2

## 2023-05-17 MED ORDER — SODIUM CHLORIDE 0.9 % IV SOLN: INTRAVENOUS | Status: AC

## 2023-05-17 MED ORDER — VITAMIN D 25 MCG (1000 UNIT) PO TABS
2000.0000 [IU] | ORAL_TABLET | Freq: Every evening | ORAL | Status: DC
  Administered 2023-05-17 – 2023-05-20 (×4): 2000 [IU] via ORAL
  Filled 2023-05-17 (×4): qty 2

## 2023-05-17 NOTE — Assessment & Plan Note (Signed)
Continue levothyoxine

## 2023-05-17 NOTE — Progress Notes (Addendum)
[facility] [address], [address] [place], [place] [address] [phone]  Date:  05/17/2023   Name:  Randy [name]   DOB:  [birth year]   MRN:  [id number]  PCP:  Tresa Garter, MD    Chief Complaint: calf pain (Left lower leg pain, swelling and reddness)   History of Present Illness:  Randy [name] is a 62 y.o. very pleasant male patient who presents with the following:  Pt seen today with concern of a red spot on his left leg and possible infection Primary pt of Dr Posey Rea, I have not sen him myself in the past History of mild diabetes/ pt states prediabetes He does endorse history of some sort of spinal infection a few years ago which required IV antibiotics via PICC line  About 10 days ago [place] and cut his left shin climbing out of the water- it seemed to be a little red but nothing major However yesterday the infection seem to start spreading, he noticed the left posterior calf became red and hot, and tender. It swelled last night as well-it seems to be getting worse  He has been using ibuprofen for the pain so he is not sure about any fever- no fever measured He did have chills last night  He is on rybelsus for weight loss but he is not having much luck with weight loss as of yet  Lab Results  Component Value Date   HGBA1C 5.3 02/08/2023   We noted irregular pulse consistent with atrial fibrillation today.  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   Low back pain 10/24/2019   Thoracic spine pain 05/16/2019   Foot pain, left 05/16/2019   Grief 05/16/2019   Shoulder pain 03/30/2018   Flu-like symptoms 10/19/2017   Influenza-like symptoms 10/19/2017   S/P knee surgery 06/28/2016   Pain of right heel 01/01/2013   RUQ abdominal pain 06/19/2012   Well adult exam 01/27/2012   Hypogonadism in male 08/24/2009   ERECTILE DYSFUNCTION, 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   COLONIC POLYPS, HX OF 04/29/2009   Hypothyroidism 06/24/2007   Dyslipidemia 06/24/2007   Essential hypertension 06/24/2007   OBESITY, MORBID 06/22/2007    Past Medical History:  Diagnosis Date   At risk for sleep apnea    STOP-BANG= 6         SENT TO PCP 06-24-2016   Back pain    History of thyroid nodule    s/p  right thyroid lopectomy--  per path report:  follicular adenoma, chronic thyroiditis   Hyperlipidemia    Hypertension    Hypogonadism male    Hypothyroidism    Obesity    Other specified disorders of tendon, right knee    quad tendon tear   SOB (shortness of breath)    Vitamin D deficiency    Wears glasses  Past Surgical History:  Procedure Laterality Date   COLONOSCOPY  2006   EYE SURGERY Right age 15   removal glass   HYPOSPADIAS CORRECTION  age 25   IR CERVICAL/THORACIC DISC ASPIRATION W/IMAG GUIDE  03/31/2020       IR CERVICAL/THORACIC DISC ASPIRATION W/IMAG GUIDE  03/31/2020   QUADRICEPS TENDON REPAIR Left 06/28/2016   Procedure: LEFT KNEE REPAIR QUADRICEP TENDON;  Surgeon: Eugenia Mcalpine, MD;  Location: [facility];  Service: Orthopedics;  Laterality: Left;   THYROID LOBECTOMY Right 02/03/2005    Social History   Tobacco Use   Smoking status: Never   Smokeless tobacco: Never  Vaping Use   Vaping status: Never Used  Substance Use Topics   Alcohol use: Not Currently    Alcohol/week: 1.0  standard drink of alcohol    Types: 1 Cans of beer per week    Comment: occasional   Drug use: No    Family History  Problem Relation Age of Onset   Mental illness Mother        dementia   Hypertension Mother    Hyperlipidemia Mother    Heart disease Mother    Thyroid disease Mother    Depression Mother    Anxiety disorder Mother    Schizophrenia Mother    Arthritis Father    COPD Father    Hypertension Father    Hyperlipidemia Father    Heart disease Father    Thyroid disease Father    Sleep apnea Father    Alcohol abuse Father    Liver disease Father     Allergies  Allergen Reactions   Penicillins     Family history of allergic reaction    Watermelon [Citrullus Vulgaris]     Makes throat itch     Medication list has been reviewed and updated.  Current Outpatient Medications on File Prior to Visit  Medication Sig Dispense Refill   amLODipine (NORVASC) 5 MG tablet Take 1 tablet (5 mg total) by mouth daily. 90 tablet 3   Ascorbic Acid (VITAMIN C WITH ROSE HIPS) 1000 MG tablet Take 5,000 mg by mouth daily.     aspirin EC 81 MG tablet Take 81 mg by mouth daily. Swallow whole.     baclofen (LIORESAL) 20 MG tablet Take 0.5 tablets (10 mg total) by mouth 3 (three) times daily as needed for muscle spasms. 30 each 0   benazepril (LOTENSIN) 20 MG tablet Take 1 tablet (20 mg total) by mouth daily. 90 tablet 3   Cholecalciferol (VITAMIN D3) 50 MCG (2000 UT) TABS Take 2,000 Units by mouth daily.      Coenzyme Q10 (COQ10) 100 MG CAPS Take 1 capsule by mouth every morning.     Cyanocobalamin (B-12) 2000 MCG TABS Take 1 tablet by mouth every morning.     Echinacea 125 MG CAPS Take by mouth.     ibuprofen (ADVIL) 200 MG tablet Take 400 mg by mouth 3 (three) times daily.      levothyroxine (SYNTHROID) 125 MCG tablet Take 2 tablets (250 mcg total) by mouth daily before breakfast. TAKE 2 TABLETS BY MOUTH BEFORE BREAKFAST . 180 tablet 3   lovastatin (MEVACOR) 40 MG tablet Take 1 tablet  (40 mg total) by mouth at bedtime. 90 tablet 3   meclizine (ANTIVERT) 25 MG tablet Take 1 tablet (25 mg total) by mouth 3 (three) times daily as needed for dizziness. 30 tablet 0   Multiple Vitamin (MULTIVITAMIN) tablet Take 1 tablet by mouth  every morning.     Semaglutide (RYBELSUS) 14 MG TABS Take 1 tablet (14 mg total) by mouth daily. 30 tablet 11   triamterene-hydrochlorothiazide (MAXZIDE-25) 37.5-25 MG tablet Take 1 tablet by mouth daily. 90 tablet 3   Turmeric 500 MG TABS Take 1 capsule by mouth every morning.     No current facility-administered medications on file prior to visit.    Review of Systems:  As per HPI- otherwise negative.   Physical Examination: Vitals:   05/17/23 1540  BP: 122/80  Pulse: 64  Resp: 18  Temp: 97.6 F (36.4 C)  SpO2: 98%   Vitals:   05/17/23 1540  Weight: (!) 322 lb 3.2 oz (146.1 kg)  Height: 6' (1.829 m)   Body mass index is 43.7 kg/m. Ideal Body Weight: Weight in (lb) to have BMI = 25: 183.9  GEN: no acute distress.  Morbid obesity, otherwise looks well HEENT: Atraumatic, Normocephalic.  Ears and Nose: No external deformity. CV: Rapid and irregular pulse, No M/G/R. No JVD. No thrill. No extra heart sounds. PULM: CTA B, no wheezes, crackles, rhonchi. No retractions. No resp. distress. No accessory muscle use. ABD: S, NT, ND. No rebound. No HSM. EXTR: No c/c/e PSYCH: Normally interactive. Conversant.  The left calf displays 2 small areas of wound on the anterior shin and large area of bright red skin on the posterior calf, there seems to be some petechial element.  There is tenderness, warmth and some swelling Normal pulse in the left foot  EKG consistent with atrial fibrillation, rate approximately 120 bpm     Assessment and Plan: Tachycardia - Plan: EKG 12-Lead  Atrial fibrillation, unspecified type (HCC)  Cellulitis of left lower extremity  Patient seen today with concern of likely infection of the left leg, he sustained a  wound of his leg while river tubing 10 days ago and as of yesterday redness, pain, heat and swelling spread rapidly.  I am concerned about possible serious infection.  Also, we note he is in new onset atrial fibrillation today.  Because of these 2 factors I have advised him to be seen in the emergency room here at the med center.  He states agreement and will proceed to the ER now  Signed Abbe Amsterdam, MD

## 2023-05-17 NOTE — ED Triage Notes (Signed)
C/O infection on left lower leg dull pain and redness; stated he was also told he has A Fib with no known hx. Stated he also took some ibuprofen PTA.

## 2023-05-17 NOTE — Progress Notes (Signed)
Pharmacy Antibiotic Note  Randy [name] is a 62 y.o. male admitted on 05/17/2023 with cellulitis.  Pharmacy has been consulted for vancomycin & zosyn dosing. Vanc 1000 mg given @ [facility] at 1725 Clinda 600 given @ [facility] 1839 Zosyn 3.375 given @ [facility] @ 1703 Plan: Zosyn 3.375g IV q8h (4 hour infusion). Vanc 1000 mg given @ [facility] @ 1725, give additional 1500 mg to make loading dose = 2500 mg then start Vancomycin 2000 mg IV q24 for eAUC 480.5 Daily SCr while on vancomycin/zosyn combination  Height: 6' (182.9 cm) Weight: (!) 146.1 kg (322 lb 3.2 oz) IBW/kg (Calculated) : 77.6  Temp (24hrs), Avg:99 F (37.2 C), Min:97.6 F (36.4 C), Max:101 F (38.3 C)  Recent Labs  Lab 05/17/23 1628 05/17/23 1629  WBC  --  6.4  CREATININE  --  1.31*  LATICACIDVEN 1.6  --     Estimated Creatinine Clearance: 85.7 mL/min (A) (by C-G formula based on SCr of 1.31 mg/dL (H)).    Allergies  Allergen Reactions   Penicillins Other (See Comments)    Family history of allergic reaction    Watermelon [Citrullus Vulgaris] Itching and Other (See Comments)    Makes throat itch     Antimicrobials this admission: 9/4 Vanc>> 9/4 zosyn>> 9/4 clinda x 1  Dose adjustments this admission:  Microbiology results: 9/4 BCx2: sent  Thank you for allowing pharmacy to be a part of this patient's care.   Herby Abraham, Pharm.D Use secure chat for questions 05/17/2023 9:10 PM

## 2023-05-17 NOTE — Assessment & Plan Note (Addendum)
-  newly diagnosed. Asymptomatic with rates up to 160. Has improved with IV fluids -keep on continuous telemetry -Goal HR <90 -obtain echocardiogram -check TSH- has hx of hypothyroidism - CHA2DS2-VASc score of 2 and discussed stroke risk and benefits and risk of initiating anticoagulation- start IV heparin per pharmacy. Hgb stable at 12.9. No hx of bleeding.

## 2023-05-17 NOTE — Assessment & Plan Note (Signed)
-  replete with oral supplement

## 2023-05-17 NOTE — Assessment & Plan Note (Signed)
BMI of 43 -pt on Rybelsus outpatient

## 2023-05-17 NOTE — ED Provider Notes (Signed)
[facility] [facility] Provider Note   CSN: [id number] Arrival date & time: 05/17/23  1612     History  Chief Complaint  Patient presents with   Cellulitis   Tachycardia   Leg Swelling    Randy [name] is a 62 y.o. male.  Patient here with redness and swelling to the left lower extremity.  Cut the front of his left leg while tubing.  Redness and swelling worse now over the last 24 to 48 hours.  Went to the primary care doctor this morning who called me who stated that patient had new A-fib with concern for bad cellulitis and likely need IV antibiotics and admission.  Patient with history of prediabetes.  He is on medicine for weight loss to help with diabetes.  History of high blood pressure.  He has not noticed a fever at home.  No history of A-fib.  Denies any nausea vomiting diarrhea.  No abdominal pain chest pain or cough.  He has been using antibiotic ointment during this time.  Not been on oral antibiotics.  The history is provided by the patient.       Home Medications Prior to Admission medications   Medication Sig Start Date End Date Taking? Authorizing Provider  amLODipine (NORVASC) 5 MG tablet Take 1 tablet (5 mg total) by mouth daily. 03/14/23   Plotnikov, Georgina Quint, MD  Ascorbic Acid (VITAMIN C WITH ROSE HIPS) 1000 MG tablet Take 5,000 mg by mouth daily.    [provider]  aspirin EC 81 MG tablet Take 81 mg by mouth daily. Swallow whole.    [provider]  baclofen (LIORESAL) 20 MG tablet Take 0.5 tablets (10 mg total) by mouth 3 (three) times daily as needed for muscle spasms. 04/28/20   Dorcas Carrow, MD  benazepril (LOTENSIN) 20 MG tablet Take 1 tablet (20 mg total) by mouth daily. 03/14/23   Plotnikov, Georgina Quint, MD  Cholecalciferol (VITAMIN D3) 50 MCG (2000 UT) TABS Take 2,000 Units by mouth daily.     [provider]  Coenzyme Q10 (COQ10) 100 MG CAPS Take 1 capsule by mouth every morning.    [provider]  Cyanocobalamin (B-12) 2000 MCG TABS Take 1 tablet by mouth every morning.    [provider]  Echinacea 125 MG CAPS Take by mouth.    [provider]  ibuprofen (ADVIL) 200 MG tablet Take 400 mg by mouth 3 (three) times daily.     [provider]  levothyroxine (SYNTHROID) 125 MCG tablet Take 2 tablets (250 mcg total) by mouth daily before breakfast. TAKE 2 TABLETS BY MOUTH BEFORE BREAKFAST . 03/14/23   Plotnikov, Georgina Quint, MD  lovastatin (MEVACOR) 40 MG tablet Take 1 tablet (40 mg total) by mouth at bedtime. 03/14/23   Plotnikov, Georgina Quint, MD  meclizine (ANTIVERT) 25 MG tablet Take 1 tablet (25 mg total) by mouth 3 (three) times daily as needed for dizziness. 04/28/20   Dorcas Carrow, MD  Multiple Vitamin (MULTIVITAMIN) tablet Take 1 tablet by mouth every morning.    [provider]  Semaglutide (RYBELSUS) 14 MG TABS Take 1 tablet (14 mg total) by mouth daily. 09/13/22   Plotnikov, Georgina Quint, MD  triamterene-hydrochlorothiazide (MAXZIDE-25) 37.5-25 MG tablet Take 1 tablet by mouth daily. 03/14/23   Plotnikov, Georgina Quint, MD  Turmeric 500 MG TABS Take 1 capsule by mouth every morning.    [provider]      Allergies  Penicillins and Watermelon [citrullus vulgaris]    Review of Systems   Review of Systems  Physical Exam Updated Vital Signs BP 108/62   Pulse (!) 108   Temp (!) 101 F (38.3 C)   Resp (!) 23   Ht 6' (1.829 m)   Wt (!) 146.1 kg   SpO2 92%   BMI 43.70 kg/m  Physical Exam Vitals and nursing note reviewed.  Constitutional:      General: He is not in acute distress.    Appearance: He is well-developed. He is not ill-appearing.  HENT:     Head: Normocephalic and atraumatic.  Eyes:     Extraocular Movements: Extraocular movements intact.     Conjunctiva/sclera: Conjunctivae normal.     Pupils: Pupils are equal, round, and reactive to light.  Cardiovascular:     Rate and Rhythm: Tachycardia present. Rhythm  irregular.     Pulses: Normal pulses.     Heart sounds: No murmur heard. Pulmonary:     Effort: Pulmonary effort is normal. No respiratory distress.     Breath sounds: Normal breath sounds.  Abdominal:     Palpations: Abdomen is soft.     Tenderness: There is no abdominal tenderness.  Musculoskeletal:        General: No swelling.     Cervical back: Neck supple.  Skin:    General: Skin is warm and dry.     Capillary Refill: Capillary refill takes less than 2 seconds.     Findings: Erythema present.     Comments: Cellulitic appearing left lower extremity with no crepitus, fairly extensive involving most of the left tib-fib area  Neurological:     General: No focal deficit present.     Mental Status: He is alert.  Psychiatric:        Mood and Affect: Mood normal.     ED Results / Procedures / Treatments   Labs (all labs ordered are listed, but only abnormal results are displayed) Labs Reviewed  COMPREHENSIVE METABOLIC PANEL - Abnormal; Notable for the following components:      Result Value   Sodium 133 (*)    Potassium 3.4 (*)    Creatinine, Ser 1.31 (*)    Calcium 8.7 (*)    All other components within normal limits  CBC WITH DIFFERENTIAL/PLATELET - Abnormal; Notable for the following components:   Hemoglobin 12.9 (*)    HCT 38.4 (*)    All other components within normal limits  PROTIME-INR - Abnormal; Notable for the following components:   Prothrombin Time 15.5 (*)    All other components within normal limits  CULTURE, BLOOD (ROUTINE X 2)  CULTURE, BLOOD (ROUTINE X 2)  LACTIC ACID, PLASMA  LACTIC ACID, PLASMA  URINALYSIS, W/ REFLEX TO CULTURE (INFECTION SUSPECTED)    EKG EKG Interpretation Date/Time:  Wednesday May 17 2023 16:25:00 EDT Ventricular Rate:  148 PR Interval:    QRS Duration:  113 QT Interval:  315 QTC Calculation: 495 R Axis:   48  Text Interpretation: Atrial fibrillation Ventricular premature complex Borderline intraventricular  conduction delay Low voltage, precordial leads Minimal ST depression, inferior leads Borderline prolonged QT interval Confirmed by Virgina Norfolk [phone]) on 05/17/2023 5:10:33 PM  Radiology DG Tibia/Fibula Left  Result Date: 05/17/2023 CLINICAL DATA:  Infection to the left lower leg with dull pain and redness. Cellulitis. Query air. Query sepsis. EXAM: LEFT TIBIA AND FIBULA - 2 VIEW COMPARISON:  Left knee 05/07/2016 FINDINGS: Mild degenerative changes in the left knee and  left ankle. No acute fracture or dislocation is demonstrated in the tibia or fibula. No focal bone lesion or bone destruction. No cortical erosion or sclerosis to suggest evidence of osteomyelitis. Old ununited ossicles anterior to the knee may represent loose bodies or synovial osteochondromatosis. Amorphous soft tissue calcifications posterior to the proximal tibia may represent vascular or dystrophic calcification. No radiopaque soft tissue foreign bodies. No soft tissue gas. IMPRESSION: 1. No acute bony abnormalities. 2. Degenerative changes in the left knee and ankle. 3. No radiopaque soft tissue foreign bodies or soft tissue gas demonstrated. Electronically Signed   By: Burman Nieves M.D.   On: 05/17/2023 18:22   DG Chest Port 1 View  Result Date: 05/17/2023 CLINICAL DATA:  Infection on left lower leg. Dull pain and redness. Sepsis. EXAM: PORTABLE CHEST 1 VIEW COMPARISON:  01/28/2005 FINDINGS: Cardiomegaly. Aortic atherosclerotic calcification. No focal consolidation, pleural effusion, or pneumothorax. No displaced rib fractures. IMPRESSION: No active disease. Electronically Signed   By: Minerva Fester M.D.   On: 05/17/2023 18:21    Procedures .Critical Care  Performed by: Virgina Norfolk, DO Authorized by: Virgina Norfolk, DO   Critical care provider statement:    Critical care time (minutes):  35   Critical care was necessary to treat or prevent imminent or life-threatening deterioration of the following conditions:   Sepsis   Critical care was time spent personally by me on the following activities:  Development of treatment plan with patient or surrogate, blood draw for specimens, discussions with primary provider, evaluation of patient's response to treatment, examination of patient, ordering and performing treatments and interventions, ordering and review of laboratory studies, ordering and review of radiographic studies, pulse oximetry, re-evaluation of patient's condition and obtaining history from patient or surrogate     Medications Ordered in ED Medications  clindamycin (CLEOCIN) IVPB 600 mg (has no administration in time range)  0.9 %  sodium chloride infusion ( Intravenous Stopped 05/17/23 1804)  sodium chloride 0.9 % bolus 1,000 mL (has no administration in time range)  0.9 %  sodium chloride infusion ( Intravenous Stopped 05/17/23 1725)  vancomycin (VANCOCIN) IVPB 1000 mg/200 mL premix (1,000 mg Intravenous New Bag/Given 05/17/23 1725)  piperacillin-tazobactam (ZOSYN) IVPB 3.375 g (0 g Intravenous Stopped 05/17/23 1708)  sodium chloride 0.9 % bolus 1,000 mL ( Intravenous Stopped 05/17/23 1803)  acetaminophen (TYLENOL) tablet 1,000 mg (1,000 mg Oral Given 05/17/23 1652)    ED Course/ Medical Decision Making/ A&P                                 Medical Decision Making Amount and/or Complexity of Data Reviewed Labs: ordered. Radiology: ordered.  Risk OTC drugs. Prescription drug management. Decision regarding hospitalization.   Randy [name] is here with cellulitis and redness of his leg with new A-fib.  EKG confirms A-fib with RVR per my review interpretation.  He appears to have a fairly extensive cellulitis to his left lower extremity.  He cut the front of his leg while tubing.  Has been using topical antibiotic ointment without much improvement.  Was sent by his primary care doctor for further evaluation.  Overall sepsis workup initiated as patient febrile tachycardic upon arrival.  Will give  broad-spectrum IV antibiotics for what I suspect is a bad cellulitis.  Have no concern for necrotizing process at this time but will get an x-ray.  Wound has been outlined.  Will see how you respond  to IV fluids and IV Tylenol but may need to put him on some rate control medication.  Will talk to the hospitalist about this.  Overall lab work per my review interpretation shows no evidence of leukocytosis or lactic acidosis.  X-rays pending.  But clinically have a low suspicion for necrotizing process given that it has been about 10 days already.  Has gotten worse over the last 24 to 48 hours.  Blood cultures have been collected.  Concern for sepsis from cellulitis and will admit for further IV antibiotics.  Will await x-ray to see if maybe we need to get surgical consultation but otherwise patient can be admitted to the hospitalist service for new A-fib with RVR in the setting of sepsis from cellulitis.  Overall x-ray per radiology report with no acute findings no gas in the x-ray of the left tib-fib as well.  Chest x-ray no evidence of pneumonia.  Overall tachycardia is greatly improved fluids.  Will hold on any further treatment for A-fib at this time as I suspect that this will self resolve with fluids and antibiotics.  Patient admitted to Dr. Ashley Royalty and medicine for further care.  Hemodynamically stable throughout my care.  This chart was dictated using voice recognition software.  Despite best efforts to proofread,  errors can occur which can change the documentation meaning.         Final Clinical Impression(s) / ED Diagnoses Final diagnoses:  Cellulitis, unspecified cellulitis site  Atrial fibrillation with rapid ventricular response [facility])    Rx / DC Orders ED Discharge Orders     None         Virgina Norfolk, DO 05/17/23 1829

## 2023-05-17 NOTE — Assessment & Plan Note (Signed)
Continue statin. 

## 2023-05-17 NOTE — Progress Notes (Signed)
PHARMACIST - PHYSICIAN ORDER COMMUNICATION  CONCERNING: P&T Medication Policy on Herbal Medications  DESCRIPTION:  This patient's order for:  CoQ-10, turmeric  has been noted.  This product(s) is classified as an "herbal" or natural product. Due to a lack of definitive safety studies or FDA approval, nonstandard manufacturing practices, plus the potential risk of unknown drug-drug interactions while on inpatient medications, the Pharmacy and Therapeutics Committee does not permit the use of "herbal" or natural products of this type within [facility].   ACTION TAKEN: The pharmacy department is unable to verify this order at this time. Please reevaluate patient's clinical condition at discharge and address if the herbal or natural product(s) should be resumed at that time.  Cindi Carbon, PharmD 05/17/23 9:15 PM

## 2023-05-17 NOTE — H&P (Signed)
History and Physical    Patient: Randy [name] WJX:[id number] DOB: 1961-05-27 DOA: 05/17/2023 DOS: the patient was seen and examined on 05/17/2023 PCP: Plotnikov, Georgina Quint, MD  Patient coming from:  OUTSIDE ED  Chief Complaint:  Chief Complaint  Patient presents with   Cellulitis   Tachycardia   Leg Swelling   HPI: Randy [name] is a 62 y.o. male with medical history significant of morbid obesity, hypertension, hyperlipidemia, type 2 diabetes and hypothyroidism s/p partial thyroidectomy who presents with left lower extremity cellulitis.  Patient reports a small cut to his left shin [place] 10 [place].  However started to have increased tenderness, redness, edema starting yesterday. Denies any fever. No nausea or vomiting. No chest pain or palpations. No shortness of breath. Reports a leg infection that went to his spine about 10 years ago.   In the ED, he was noted to be in atrial fibrillation with RVR with rates up to 160. He was febrile to 101F, blood pressure also borderline with SBP in the 90s to 100.  No leukocytosis. Hgb of 12.9.   Mild hyponatremia of 133, hypokalemia of 2.4. Creatinine stable from prior at 1.31.   Left tib/fibular X-ray negative for acute findings.   He was started on broad spectrum IV antibiotics and transferred here to [facility] for further management.      Review of Systems: As mentioned in the history of present illness. All other systems reviewed and are negative. Past Medical History:  Diagnosis Date   At risk for sleep apnea    STOP-BANG= 6         SENT TO PCP 06-24-2016   Back pain    History of thyroid nodule    s/p  right thyroid lopectomy--  per path report:  follicular adenoma, chronic thyroiditis   Hyperlipidemia    Hypertension    Hypogonadism male    Hypothyroidism    Obesity    Other specified disorders of tendon, right knee    quad tendon tear   SOB (shortness of breath)    Vitamin D deficiency    Wears glasses     Past Surgical History:  Procedure Laterality Date   COLONOSCOPY  2006   EYE SURGERY Right age 53   removal glass   HYPOSPADIAS CORRECTION  age 49   IR CERVICAL/THORACIC DISC ASPIRATION W/IMAG GUIDE  03/31/2020       IR CERVICAL/THORACIC DISC ASPIRATION W/IMAG GUIDE  03/31/2020   QUADRICEPS TENDON REPAIR Left 06/28/2016   Procedure: LEFT KNEE REPAIR QUADRICEP TENDON;  Surgeon: Eugenia Mcalpine, MD;  Location: [facility] [place];  Service: Orthopedics;  Laterality: Left;   THYROID LOBECTOMY Right 02/03/2005   Social History:  reports that he has never smoked. He has never used smokeless tobacco. He reports that he does not currently use alcohol after a past usage of about 1.0 standard drink of alcohol per week. He reports that he does not use drugs.  Allergies  Allergen Reactions   Penicillins Other (See Comments)    Family history of allergic reaction    Watermelon [Citrullus Vulgaris] Itching and Other (See Comments)    Makes throat itch     Family History  Problem Relation Age of Onset   Mental illness Mother        dementia   Hypertension Mother    Hyperlipidemia Mother    Heart disease Mother    Thyroid disease Mother    Depression Mother  Anxiety disorder Mother    Schizophrenia Mother    Arthritis Father    COPD Father    Hypertension Father    Hyperlipidemia Father    Heart disease Father    Thyroid disease Father    Sleep apnea Father    Alcohol abuse Father    Liver disease Father     Prior to Admission medications   Medication Sig Start Date End Date Taking? Authorizing Provider  Ascorbic Acid (VITAMIN C WITH ROSE HIPS) 1000 MG tablet Take 5,000 mg by mouth daily in the afternoon.   Yes [provider]  Cholecalciferol (VITAMIN D3) 50 MCG (2000 UT) TABS Take 2,000 Units by mouth every evening.   Yes [provider]  Coenzyme Q10 (COQ10) 100 MG CAPS Take 100 mg by mouth every morning.   Yes [provider]  Cyanocobalamin  (B-12) 2000 MCG TABS Take 1 tablet by mouth every morning.   Yes [provider]  TURMERIC PO Take 1 capsule by mouth every evening.   Yes [provider]  amLODipine (NORVASC) 5 MG tablet Take 1 tablet (5 mg total) by mouth daily. 03/14/23   Plotnikov, Georgina Quint, MD  aspirin EC 81 MG tablet Take 81 mg by mouth daily. Swallow whole.    [provider]  baclofen (LIORESAL) 20 MG tablet Take 0.5 tablets (10 mg total) by mouth 3 (three) times daily as needed for muscle spasms. Patient not taking: Reported on 05/17/2023 04/28/20   Dorcas Carrow, MD  benazepril (LOTENSIN) 20 MG tablet Take 1 tablet (20 mg total) by mouth daily. 03/14/23   Plotnikov, Georgina Quint, MD  ibuprofen (ADVIL) 200 MG tablet Take 400 mg by mouth 3 (three) times daily.     [provider]  levothyroxine (SYNTHROID) 125 MCG tablet Take 2 tablets (250 mcg total) by mouth daily before breakfast. TAKE 2 TABLETS BY MOUTH BEFORE BREAKFAST . 03/14/23   Plotnikov, Georgina Quint, MD  lovastatin (MEVACOR) 40 MG tablet Take 1 tablet (40 mg total) by mouth at bedtime. 03/14/23   Plotnikov, Georgina Quint, MD  meclizine (ANTIVERT) 25 MG tablet Take 1 tablet (25 mg total) by mouth 3 (three) times daily as needed for dizziness. 04/28/20   Dorcas Carrow, MD  Multiple Vitamin (MULTIVITAMIN) tablet Take 1 tablet by mouth every morning.    [provider]  Semaglutide (RYBELSUS) 14 MG TABS Take 1 tablet (14 mg total) by mouth daily. 09/13/22   Plotnikov, Georgina Quint, MD  triamterene-hydrochlorothiazide (MAXZIDE-25) 37.5-25 MG tablet Take 1 tablet by mouth daily. 03/14/23   Plotnikov, Georgina Quint, MD  Turmeric 500 MG TABS Take 1 capsule by mouth every morning.    [provider]    Physical Exam: Vitals:   05/17/23 1800 05/17/23 1815 05/17/23 1849 05/17/23 1953  BP: (!) 92/54 108/62  (!) 128/105  Pulse: (!) 118 (!) 108  (!) 112  Resp: (!) 29 (!) 23  18  Temp:   98.8 F (37.1 C) 98.7 F (37.1 C)  TempSrc:   Oral Oral   SpO2: 93% 92%  100%  Weight:      Height:       Constitutional: NAD, calm, comfortable, morbidly obese male laying in bed Eyes: lids and conjunctivae normal ENMT: Mucous membranes are moist.  Neck: normal, supple Respiratory: clear to auscultation bilaterally, no wheezing, no crackles. Normal respiratory effort. No accessory muscle use.  Cardiovascular: irregularly irregular rate and rhythm, no murmurs / rubs / gallops. +3 pitting edema of left  LE.  2+ pedal pulses.  Abdomen: no tenderness, Soft Musculoskeletal: no clubbing / cyanosis. No joint deformity upper and lower extremities.  Normal muscle tone.  Skin: small healing scab to left LE mid-pretibial region with circumferential +3 pitting edema and erythema of the entire lower left extremity. Large area of non-blanching purpuric rash spanning posterior calf. Minimal tenderness with palpation.  Neurologic: CN 2-12 grossly intact. Psychiatric: Normal judgment and insight. Alert and oriented x 3. Normal mood.   Data Reviewed:  See HPI  Assessment and Plan: * Sepsis (HCC) -secondary to left lower extremity cellulitis from freshwater wound  -given extensiveness of cellulitis, will obtain CT lower extremity to rule out osteomyelitis or abscess -obtain venous doppler to rule out DVT  -Continue IV vancomycin and Zosyn  -keep on continuous IV fluid  Paroxysmal atrial fibrillation with RVR (HCC) -newly diagnosed. Asymptomatic with rates up to 160. Has improved with IV fluids -keep on continuous telemetry -Goal HR <90 -obtain echocardiogram -check TSH- has hx of hypothyroidism - CHA2DS2-VASc score of 2 and discussed stroke risk and benefits and risk of initiating anticoagulation- start IV heparin per pharmacy. Hgb stable at 12.9. No hx of bleeding.   Mixed hyperlipidemia Continue statin   Hypokalemia -replete with oral supplement  Type 2 diabetes mellitus (HCC) -Controlled. Last A1C of 5.3  Essential hypertension -borderline  soft with sepsis. Hold antihypertensives for now and monitor.  OBESITY, MORBID BMI of 43 -pt on Rybelsus outpatient   Hypothyroidism Continue levothyoxine      Advance Care Planning:   Code Status: Full Code   Consults: none  Family Communication: none at bedside  Severity of Illness: The appropriate patient status for this patient is INPATIENT. Inpatient status is judged to be reasonable and necessary in order to provide the required intensity of service to ensure the patient's safety. The patient's presenting symptoms, physical exam findings, and initial radiographic and laboratory data in the context of their chronic comorbidities is felt to place them at high risk for further clinical deterioration. Furthermore, it is not anticipated that the patient will be medically stable for discharge from the hospital within 2 midnights of admission.   * I certify that at the point of admission it is my clinical judgment that the patient will require inpatient hospital care spanning beyond 2 midnights from the point of admission due to high intensity of service, high risk for further deterioration and high frequency of surveillance required.*  Author: Anselm Jungling, DO 05/17/2023 9:22 PM  For on call review [email or web address].

## 2023-05-17 NOTE — Assessment & Plan Note (Signed)
-  Controlled. Last A1C of 5.3

## 2023-05-17 NOTE — Progress Notes (Signed)
ANTICOAGULATION CONSULT NOTE - Initial Consult  Pharmacy Consult for heparin Indication: atrial fibrillation  Allergies  Allergen Reactions   Penicillins Other (See Comments)    Family history of allergic reaction    Watermelon [Citrullus Vulgaris] Itching and Other (See Comments)    Makes throat itch     Patient Measurements: Height: 6' (182.9 cm) Weight: (!) 146.1 kg (322 lb 3.2 oz) IBW/kg (Calculated) : 77.6 Heparin Dosing Weight: 111.7 kg  Vital Signs: Temp: 98.7 F (37.1 C) (09/04 1953) Temp Source: Oral (09/04 1953) BP: 128/105 (09/04 1953) Pulse Rate: 112 (09/04 1953)  Labs: Recent Labs    05/17/23 1629  HGB 12.9*  HCT 38.4*  PLT 181  LABPROT 15.5*  INR 1.2  CREATININE 1.31*    Estimated Creatinine Clearance: 85.7 mL/min (A) (by C-G formula based on SCr of 1.31 mg/dL (H)).   Medical History: Past Medical History:  Diagnosis Date   At risk for sleep apnea    STOP-BANG= 6         SENT TO PCP 06-24-2016   Back pain    History of thyroid nodule    s/p  right thyroid lopectomy--  per path report:  follicular adenoma, chronic thyroiditis   Hyperlipidemia    Hypertension    Hypogonadism male    Hypothyroidism    Obesity    Other specified disorders of tendon, right knee    quad tendon tear   SOB (shortness of breath)    Vitamin D deficiency    Wears glasses     Medications:  Medications Prior to Admission  Medication Sig Dispense Refill Last Dose   amLODipine (NORVASC) 5 MG tablet Take 1 tablet (5 mg total) by mouth daily. 90 tablet 3 05/17/2023 at am   Ascorbic Acid (VITAMIN C WITH ROSE HIPS) 1000 MG tablet Take 5,000 mg by mouth daily in the afternoon.   05/16/2023   aspirin EC 81 MG tablet Take 81 mg by mouth daily. Swallow whole.   05/17/2023 at 0800   benazepril (LOTENSIN) 20 MG tablet Take 1 tablet (20 mg total) by mouth daily. 90 tablet 3 05/17/2023 at am   Cholecalciferol (VITAMIN D3) 50 MCG (2000 UT) TABS Take 2,000 Units by mouth every evening.    05/16/2023 at pm   Coenzyme Q10 (COQ10) 100 MG CAPS Take 100 mg by mouth every morning.   05/17/2023   Cyanocobalamin (B-12) 2000 MCG TABS Take 2,000 mcg by mouth every morning.   05/17/2023   ibuprofen (ADVIL) 200 MG tablet Take 400 mg by mouth 2 (two) times daily.   05/17/2023 at am   levothyroxine (SYNTHROID) 125 MCG tablet Take 2 tablets (250 mcg total) by mouth daily before breakfast. TAKE 2 TABLETS BY MOUTH BEFORE BREAKFAST . (Patient taking differently: Take 250 mcg by mouth daily before breakfast.) 180 tablet 3 05/17/2023 at am   lovastatin (MEVACOR) 40 MG tablet Take 1 tablet (40 mg total) by mouth at bedtime. 90 tablet 3 05/16/2023 at pm   Multiple Vitamin (MULTIVITAMIN) tablet Take 1 tablet by mouth daily with breakfast.   05/17/2023   Semaglutide (RYBELSUS) 14 MG TABS Take 1 tablet (14 mg total) by mouth daily. (Patient taking differently: Take 14 mg by mouth See admin instructions. Take 14 mg by mouth first thing in the morning, upon waking) 30 tablet 11 05/17/2023 at am   triamterene-hydrochlorothiazide (MAXZIDE-25) 37.5-25 MG tablet Take 1 tablet by mouth daily. 90 tablet 3 05/17/2023 at am   TURMERIC PO Take 1 capsule  by mouth every evening.   05/16/2023 at pm   baclofen (LIORESAL) 20 MG tablet Take 0.5 tablets (10 mg total) by mouth 3 (three) times daily as needed for muscle spasms. (Patient not taking: Reported on 05/17/2023) 30 each 0 Not Taking   meclizine (ANTIVERT) 25 MG tablet Take 1 tablet (25 mg total) by mouth 3 (three) times daily as needed for dizziness. (Patient not taking: Reported on 05/17/2023) 30 tablet 0 Not Taking    Assessment: Pharmacy consulted to dose heparin for new onset Afib.  No anticoagulants PTA. SCr 1.31; INR 1.2. Hg 12.9; PLT 181.  No bleeding reported.    Goal of Therapy:  Heparin level 0.3-0.7 units/ml Monitor platelets by anticoagulation protocol: Yes   Plan:  Heparin 4000 units IV x 1  Heparin drip @ 1700 units/hr Check 6 hr heparin level Daily CBC & heparin  level  Herby Abraham, Pharm.D Use secure chat for questions 05/17/2023 9:14 PM

## 2023-05-17 NOTE — ED Notes (Signed)
IV attempt x 2 by other staff- unsuccessful

## 2023-05-17 NOTE — Progress Notes (Signed)
Elink is following code sepsis 

## 2023-05-17 NOTE — ED Notes (Signed)
ED TO INPATIENT HANDOFF REPORT  ED Nurse Name and Phone #: Ihor Dow, RN [phone]  S Name/Age/Gender Randy [name] 62 y.o. male Room/Bed: MH01/MH01  Code Status   Code Status: Prior  Home/SNF/Other Home Patient oriented to: self, place, time, and situation Is this baseline? Yes   Triage Complete: Triage complete  Chief Complaint Cellulitis [L03.90]  Triage Note C/O infection on left lower leg dull pain and redness; stated he was also told he has A Fib with no known hx. Stated he also took some ibuprofen PTA.    Allergies Allergies  Allergen Reactions   Penicillins     Family history of allergic reaction    Watermelon [Citrullus Vulgaris]     Makes throat itch     Level of Care/Admitting Diagnosis ED Disposition     ED Disposition  Admit   Condition  --   Comment  Hospital Area: [facility] [[id number]]  Level of Care: Telemetry [5]  Admit to tele based on following criteria: Complex arrhythmia (Bradycardia/Tachycardia)  May admit patient to [facility] or [facility] if equivalent level of care is available:: Yes  Interfacility transfer: Yes  Covid Evaluation: Asymptomatic - no recent exposure (last 10 days) testing not required  Diagnosis: Cellulitis [696295]  Admitting Physician: Alwyn Ren [[id number]]  Attending Physician: Alwyn Ren [[id number]]  Certification:: I certify this patient will need inpatient services for at least 2 midnights  Expected Medical Readiness: 05/22/2023          B Medical/Surgery History Past Medical History:  Diagnosis Date   At risk for sleep apnea    STOP-BANG= 6         SENT TO PCP 06-24-2016   Back pain    History of thyroid nodule    s/p  right thyroid lopectomy--  per path report:  follicular adenoma, chronic thyroiditis   Hyperlipidemia    Hypertension    Hypogonadism male    Hypothyroidism    Obesity    Other specified disorders of tendon, right knee    quad tendon tear    SOB (shortness of breath)    Vitamin D deficiency    Wears glasses    Past Surgical History:  Procedure Laterality Date   COLONOSCOPY  2006   EYE SURGERY Right age 56   removal glass   HYPOSPADIAS CORRECTION  age 41   IR CERVICAL/THORACIC DISC ASPIRATION W/IMAG GUIDE  03/31/2020       IR CERVICAL/THORACIC DISC ASPIRATION W/IMAG GUIDE  03/31/2020   QUADRICEPS TENDON REPAIR Left 06/28/2016   Procedure: LEFT KNEE REPAIR QUADRICEP TENDON;  Surgeon: Eugenia Mcalpine, MD;  Location: [facility];  Service: Orthopedics;  Laterality: Left;   THYROID LOBECTOMY Right 02/03/2005     A IV Location/Drains/Wounds Patient Lines/Drains/Airways Status     Active Line/Drains/Airways     Name Placement date Placement time Site Days   Peripheral IV 05/17/23 18 G 1.88" Right Antecubital 05/17/23  1656  Antecubital  less than 1   PICC Single Lumen 03/31/20 PICC Left Basilic 42 cm 03/31/20  0929  Basilic  1142   Incision (Closed) 06/28/16 Knee Left 06/28/16  1414  -- 2514   Wound / Incision (Open or Dehisced) 03/31/20 Puncture Back Medial;Mid thoracic disc aspiration needle insertion site 03/31/20  1015  Back  1142            Intake/Output Last 24 hours  Intake/Output Summary (Last 24 hours) at 05/17/2023 1831 Last data filed  at 05/17/2023 1807 Gross per 24 hour  Intake 1005.68 ml  Output --  Net 1005.68 ml    Labs/Imaging Results for orders placed or performed during the hospital encounter of 05/17/23 (from the past 48 hour(s))  Lactic acid, plasma     Status: None   Collection Time: 05/17/23  4:28 PM  Result Value Ref Range   Lactic Acid, Venous 1.6 0.5 - 1.9 mmol/L    Comment: Performed at [facility], [address] [place], [place] [address]  Comprehensive metabolic panel     Status: Abnormal   Collection Time: 05/17/23  4:29 PM  Result Value Ref Range   Sodium 133 (L) 135 - 145 mmol/L   Potassium 3.4 (L) 3.5 - 5.1 mmol/L   Chloride 98 98 - 111 mmol/L    CO2 24 22 - 32 mmol/L   Glucose, Bld 96 70 - 99 mg/dL    Comment: Glucose reference range applies only to samples taken after fasting for at least 8 hours.   BUN 23 8 - 23 mg/dL   Creatinine, Ser 1.91 (H) 0.61 - 1.24 mg/dL   Calcium 8.7 (L) 8.9 - 10.3 mg/dL   Total Protein 6.9 6.5 - 8.1 g/dL   Albumin 4.0 3.5 - 5.0 g/dL   AST 23 15 - 41 U/L   ALT 33 0 - 44 U/L   Alkaline Phosphatase 45 38 - 126 U/L   Total Bilirubin 1.1 0.3 - 1.2 mg/dL   GFR, Estimated >47 >82 mL/min    Comment: (NOTE) Calculated using the CKD-EPI Creatinine Equation (2021)    Anion gap 11 5 - 15    Comment: Performed at [facility] [address] [place], [place] [address]  CBC with Differential     Status: Abnormal   Collection Time: 05/17/23  4:29 PM  Result Value Ref Range   WBC 6.4 4.0 - 10.5 K/uL   RBC 4.25 4.22 - 5.81 MIL/uL   Hemoglobin 12.9 (L) 13.0 - 17.0 g/dL   HCT 30.8 (L) 65.7 - 84.6 %   MCV 90.4 80.0 - 100.0 fL   MCH 30.4 26.0 - 34.0 pg   MCHC 33.6 30.0 - 36.0 g/dL   RDW 96.2 95.2 - 84.1 %   Platelets 181 150 - 400 K/uL   nRBC 0.0 0.0 - 0.2 %   Neutrophils Relative % 78 %   Neutro Abs 5.0 1.7 - 7.7 K/uL   Lymphocytes Relative 12 %   Lymphs Abs 0.8 0.7 - 4.0 K/uL   Monocytes Relative 9 %   Monocytes Absolute 0.5 0.1 - 1.0 K/uL   Eosinophils Relative 0 %   Eosinophils Absolute 0.0 0.0 - 0.5 K/uL   Basophils Relative 1 %   Basophils Absolute 0.0 0.0 - 0.1 K/uL   Immature Granulocytes 0 %   Abs Immature Granulocytes 0.01 0.00 - 0.07 K/uL    Comment: Performed at [facility], [address] [place], [place] [address]  Protime-INR     Status: Abnormal   Collection Time: 05/17/23  4:29 PM  Result Value Ref Range   Prothrombin Time 15.5 (H) 11.4 - 15.2 seconds   INR 1.2 0.8 - 1.2    Comment: (NOTE) INR goal varies based on device and disease states. Performed at [facility], [address] [place], [place] [address]    DG Tibia/Fibula Left  Result  Date: 05/17/2023 CLINICAL DATA:  Infection to the left lower  leg with dull pain and redness. Cellulitis. Query air. Query sepsis. EXAM: LEFT TIBIA AND FIBULA - 2 VIEW COMPARISON:  Left knee 05/07/2016 FINDINGS: Mild degenerative changes in the left knee and left ankle. No acute fracture or dislocation is demonstrated in the tibia or fibula. No focal bone lesion or bone destruction. No cortical erosion or sclerosis to suggest evidence of osteomyelitis. Old ununited ossicles anterior to the knee may represent loose bodies or synovial osteochondromatosis. Amorphous soft tissue calcifications posterior to the proximal tibia may represent vascular or dystrophic calcification. No radiopaque soft tissue foreign bodies. No soft tissue gas. IMPRESSION: 1. No acute bony abnormalities. 2. Degenerative changes in the left knee and ankle. 3. No radiopaque soft tissue foreign bodies or soft tissue gas demonstrated. Electronically Signed   By: Burman Nieves M.D.   On: 05/17/2023 18:22   DG Chest Port 1 View  Result Date: 05/17/2023 CLINICAL DATA:  Infection on left lower leg. Dull pain and redness. Sepsis. EXAM: PORTABLE CHEST 1 VIEW COMPARISON:  01/28/2005 FINDINGS: Cardiomegaly. Aortic atherosclerotic calcification. No focal consolidation, pleural effusion, or pneumothorax. No displaced rib fractures. IMPRESSION: No active disease. Electronically Signed   By: Minerva Fester M.D.   On: 05/17/2023 18:21    Pending Labs Unresulted Labs (From admission, onward)     Start     Ordered   05/17/23 1624  Lactic acid, plasma  Now then every 2 hours,   R (with STAT occurrences)      05/17/23 1623   05/17/23 1624  Culture, blood (Routine x 2)  BLOOD CULTURE X 2,   STAT      05/17/23 1623   05/17/23 1624  Urinalysis, w/ Reflex to Culture (Infection Suspected) -Urine, Clean Catch  Once,   URGENT       Question:  Specimen Source  Answer:  Urine, Clean Catch   05/17/23 1623            Vitals/Pain Today's Vitals    05/17/23 1745 05/17/23 1800 05/17/23 1808 05/17/23 1815  BP: 107/61 (!) 92/54  108/62  Pulse: (!) 120 (!) 118  (!) 108  Resp: (!) 32 (!) 29  (!) 23  Temp:      SpO2: 92% 93%  92%  Weight:      Height:      PainSc:   2      Isolation Precautions No active isolations  Medications Medications  clindamycin (CLEOCIN) IVPB 600 mg (has no administration in time range)  0.9 %  sodium chloride infusion ( Intravenous Stopped 05/17/23 1804)  sodium chloride 0.9 % bolus 1,000 mL (has no administration in time range)  0.9 %  sodium chloride infusion ( Intravenous Stopped 05/17/23 1725)  vancomycin (VANCOCIN) IVPB 1000 mg/200 mL premix (1,000 mg Intravenous New Bag/Given 05/17/23 1725)  piperacillin-tazobactam (ZOSYN) IVPB 3.375 g (0 g Intravenous Stopped 05/17/23 1708)  sodium chloride 0.9 % bolus 1,000 mL ( Intravenous Stopped 05/17/23 1803)  acetaminophen (TYLENOL) tablet 1,000 mg (1,000 mg Oral Given 05/17/23 1652)    Mobility walks     Focused Assessments Sepsis   R Recommendations: See Admitting Provider Note  Report given to:   Additional Notes:

## 2023-05-17 NOTE — ED Notes (Signed)
Blood cultures obtained prior to ABX administration 

## 2023-05-17 NOTE — Assessment & Plan Note (Addendum)
-  secondary to left lower extremity cellulitis from freshwater wound  -given extensiveness of cellulitis, will obtain CT lower extremity to rule out osteomyelitis or abscess -obtain venous doppler to rule out DVT  -Continue IV vancomycin and Zosyn  -keep on continuous IV fluid

## 2023-05-17 NOTE — Assessment & Plan Note (Signed)
-  borderline soft with sepsis. Hold antihypertensives for now and monitor.

## 2023-05-18 ENCOUNTER — Inpatient Hospital Stay (HOSPITAL_COMMUNITY): Payer: BC Managed Care – PPO

## 2023-05-18 DIAGNOSIS — M7989 Other specified soft tissue disorders: Secondary | ICD-10-CM | POA: Diagnosis not present

## 2023-05-18 DIAGNOSIS — I480 Paroxysmal atrial fibrillation: Secondary | ICD-10-CM | POA: Diagnosis not present

## 2023-05-18 DIAGNOSIS — A419 Sepsis, unspecified organism: Secondary | ICD-10-CM | POA: Diagnosis not present

## 2023-05-18 DIAGNOSIS — L03116 Cellulitis of left lower limb: Secondary | ICD-10-CM | POA: Diagnosis not present

## 2023-05-18 DIAGNOSIS — E782 Mixed hyperlipidemia: Secondary | ICD-10-CM | POA: Diagnosis not present

## 2023-05-18 DIAGNOSIS — R6520 Severe sepsis without septic shock: Secondary | ICD-10-CM

## 2023-05-18 LAB — CBC
HCT: 38.5 % — ABNORMAL LOW (ref 39.0–52.0)
Hemoglobin: 12.7 g/dL — ABNORMAL LOW (ref 13.0–17.0)
MCH: 30.5 pg (ref 26.0–34.0)
MCHC: 33 g/dL (ref 30.0–36.0)
MCV: 92.5 fL (ref 80.0–100.0)
Platelets: 159 10*3/uL (ref 150–400)
RBC: 4.16 MIL/uL — ABNORMAL LOW (ref 4.22–5.81)
RDW: 13.5 % (ref 11.5–15.5)
WBC: 4.5 10*3/uL (ref 4.0–10.5)
nRBC: 0 % (ref 0.0–0.2)

## 2023-05-18 LAB — BASIC METABOLIC PANEL
Anion gap: 10 (ref 5–15)
Anion gap: 9 (ref 5–15)
BUN: 19 mg/dL (ref 8–23)
BUN: 17 mg/dL (ref 8–23)
CO2: 24 mmol/L (ref 22–32)
CO2: 23 mmol/L (ref 22–32)
Calcium: 8.2 mg/dL — ABNORMAL LOW (ref 8.9–10.3)
Calcium: 7.9 mg/dL — ABNORMAL LOW (ref 8.9–10.3)
Chloride: 99 mmol/L (ref 98–111)
Chloride: 100 mmol/L (ref 98–111)
Creatinine, Ser: 1.23 mg/dL (ref 0.61–1.24)
Creatinine, Ser: 1.27 mg/dL — ABNORMAL HIGH (ref 0.61–1.24)
GFR, Estimated: >60 mL/min (ref >60)
GFR, Estimated: >60 mL/min (ref >60)
Glucose, Bld: 120 mg/dL — ABNORMAL HIGH (ref 70–99)
Glucose, Bld: 108 mg/dL — ABNORMAL HIGH (ref 70–99)
Potassium: 3.2 mmol/L — ABNORMAL LOW (ref 3.5–5.1)
Potassium: 3.7 mmol/L (ref 3.5–5.1)
Sodium: 133 mmol/L — ABNORMAL LOW (ref 135–145)
Sodium: 132 mmol/L — ABNORMAL LOW (ref 135–145)

## 2023-05-18 LAB — HEPARIN LEVEL (UNFRACTIONATED)
Heparin Unfractionated: 0.24 [IU]/mL — ABNORMAL LOW (ref 0.30–0.70)
Heparin Unfractionated: 0.22 [IU]/mL — ABNORMAL LOW (ref 0.30–0.70)
Heparin Unfractionated: 0.38 [IU]/mL (ref 0.30–0.70)

## 2023-05-18 LAB — HIV ANTIBODY (ROUTINE TESTING W REFLEX): HIV Screen 4th Generation wRfx: NONREACTIVE

## 2023-05-18 LAB — PHOSPHORUS: Phosphorus: 2.6 mg/dL (ref 2.5–4.6)

## 2023-05-18 LAB — MAGNESIUM: Magnesium: 2.1 mg/dL (ref 1.7–2.4)

## 2023-05-18 LAB — LACTIC ACID, PLASMA: Lactic Acid, Venous: 1.1 mmol/L (ref 0.5–1.9)

## 2023-05-18 LAB — TSH: TSH: 0.393 u[IU]/mL (ref 0.350–4.500)

## 2023-05-18 MED ORDER — HEPARIN BOLUS VIA INFUSION
1700.0000 [IU] | INTRAVENOUS | Status: AC
  Administered 2023-05-18: 1700 [IU] via INTRAVENOUS
  Filled 2023-05-18: qty 1700

## 2023-05-18 MED ORDER — POTASSIUM CHLORIDE CRYS ER 20 MEQ PO TBCR
40.0000 meq | EXTENDED_RELEASE_TABLET | Freq: Once | ORAL | Status: AC
  Administered 2023-05-18: 40 meq via ORAL
  Filled 2023-05-18: qty 2

## 2023-05-18 MED ORDER — SODIUM CHLORIDE 0.9 % IV SOLN: INTRAVENOUS | Status: AC

## 2023-05-18 MED ORDER — HEPARIN (PORCINE) 25000 UT/250ML-% IV SOLN
2100.0000 [IU]/h | INTRAVENOUS | Status: DC
  Administered 2023-05-18 – 2023-05-19 (×2): 2100 [IU]/h via INTRAVENOUS
  Filled 2023-05-18 (×2): qty 250

## 2023-05-18 MED ORDER — IOHEXOL 300 MG/ML  SOLN
100.0000 mL | Freq: Once | INTRAMUSCULAR | Status: AC | PRN
  Administered 2023-05-18: 100 mL via INTRAVENOUS

## 2023-05-18 MED ORDER — SODIUM CHLORIDE 0.9 % IV BOLUS
500.0000 mL | Freq: Once | INTRAVENOUS | Status: AC
  Administered 2023-05-19: 500 mL via INTRAVENOUS

## 2023-05-18 MED ORDER — HEPARIN BOLUS VIA INFUSION
1500.0000 [IU] | Freq: Once | INTRAVENOUS | Status: AC
  Administered 2023-05-18: 1500 [IU] via INTRAVENOUS
  Filled 2023-05-18: qty 1500

## 2023-05-18 MED ORDER — HYDROXYZINE HCL 25 MG PO TABS
25.0000 mg | ORAL_TABLET | Freq: Once | ORAL | Status: AC
  Administered 2023-05-19: 25 mg via ORAL
  Filled 2023-05-18: qty 1

## 2023-05-18 MED ORDER — SEMAGLUTIDE 14 MG PO TABS: 14.0000 mg | ORAL_TABLET | Freq: Every day | ORAL | Status: DC

## 2023-05-18 NOTE — Progress Notes (Signed)
ANTICOAGULATION CONSULT NOTE - follow up  Pharmacy Consult for heparin Indication: atrial fibrillation  Allergies  Allergen Reactions   Penicillins Other (See Comments)    Family history of allergic reaction    Watermelon [Citrullus Vulgaris] Itching and Other (See Comments)    Makes throat itch     Patient Measurements: Height: 6' (182.9 cm) Weight: (!) 146.1 kg (322 lb 3.2 oz) IBW/kg (Calculated) : 77.6 Heparin Dosing Weight: 111.7 kg  Vital Signs: Temp: 99.7 F (37.6 C) (09/05 2142) Temp Source: Oral (09/05 1506) BP: 108/79 (09/05 2142) Pulse Rate: 126 (09/05 2142)  Labs: Recent Labs    05/17/23 1629 05/18/23 0110 05/18/23 0501 05/18/23 1213 05/18/23 2137  HGB 12.9* 12.7*  --   --   --   HCT 38.4* 38.5*  --   --   --   PLT 181 159  --   --   --   LABPROT 15.5*  --   --   --   --   INR 1.2  --   --   --   --   HEPARINUNFRC  --   --  0.24* 0.22* 0.38  CREATININE 1.31* 1.23  --   --   --     Estimated Creatinine Clearance: 91.3 mL/min (by C-G formula based on SCr of 1.23 mg/dL).   Medical History: Past Medical History:  Diagnosis Date   At risk for sleep apnea    STOP-BANG= 6         SENT TO PCP 06-24-2016   Back pain    History of thyroid nodule    s/p  right thyroid lopectomy--  per path report:  follicular adenoma, chronic thyroiditis   Hyperlipidemia    Hypertension    Hypogonadism male    Hypothyroidism    Obesity    Other specified disorders of tendon, right knee    quad tendon tear   SOB (shortness of breath)    Vitamin D deficiency    Wears glasses      Assessment: Pharmacy consulted to dose heparin for new onset Afib.  No anticoagulants PTA. Baseline labs: SCr 1.31. INR 1.2. Hgb 12.9. Plt 181K. No bleeding reported.   Today, 05/18/2023: Heparin level = 0.22 units/mL subtherapeutic and decreased despite bolus and rate increase earlier today CBC: Hgb 12.7, Plt 159K No bleeding or infusion issues per RN No interruptions in therapy per RN.  Confirmed heparin running at previously specified rate.    2137 HL 0.38 therapeutic on 2100 units/hr No bleeding noted  Goal of Therapy:  Heparin level 0.3-0.7 units/ml Monitor platelets by anticoagulation protocol: Yes   Plan:  continue heparin infusion rate to 2100 units/hr Confirmatory heparin level in 6 hours Daily CBC, heparin level Monitor closely for s/sx of bleeding  Arley Phenix RPh 05/18/2023, 10:04 PM

## 2023-05-18 NOTE — Progress Notes (Signed)
2D echo attempted a second time. Patient walking with staff. Will try later

## 2023-05-18 NOTE — Progress Notes (Signed)
Left lower extremity venous duplex has been completed. Preliminary results can be found in CV Proc through chart review.   05/18/23 9:45 AM Olen Cordial RVT

## 2023-05-18 NOTE — Progress Notes (Signed)
ANTICOAGULATION CONSULT NOTE - follow up  Pharmacy Consult for heparin Indication: atrial fibrillation  Allergies  Allergen Reactions   Penicillins Other (See Comments)    Family history of allergic reaction    Watermelon [Citrullus Vulgaris] Itching and Other (See Comments)    Makes throat itch     Patient Measurements: Height: 6' (182.9 cm) Weight: (!) 146.1 kg (322 lb 3.2 oz) IBW/kg (Calculated) : 77.6 Heparin Dosing Weight: 111.7 kg  Vital Signs: Temp: 99.6 F (37.6 C) (09/05 0221) Temp Source: Oral (09/05 0221) BP: 113/57 (09/05 0221) Pulse Rate: 112 (09/05 0221)  Labs: Recent Labs    05/17/23 1629 05/18/23 0110 05/18/23 0501  HGB 12.9* 12.7*  --   HCT 38.4* 38.5*  --   PLT 181 159  --   LABPROT 15.5*  --   --   INR 1.2  --   --   HEPARINUNFRC  --   --  0.24*  CREATININE 1.31* 1.23  --     Estimated Creatinine Clearance: 91.3 mL/min (by C-G formula based on SCr of 1.23 mg/dL).   Medical History: Past Medical History:  Diagnosis Date   At risk for sleep apnea    STOP-BANG= 6         SENT TO PCP 06-24-2016   Back pain    History of thyroid nodule    s/p  right thyroid lopectomy--  per path report:  follicular adenoma, chronic thyroiditis   Hyperlipidemia    Hypertension    Hypogonadism male    Hypothyroidism    Obesity    Other specified disorders of tendon, right knee    quad tendon tear   SOB (shortness of breath)    Vitamin D deficiency    Wears glasses     Medications:  Medications Prior to Admission  Medication Sig Dispense Refill Last Dose   amLODipine (NORVASC) 5 MG tablet Take 1 tablet (5 mg total) by mouth daily. 90 tablet 3 05/17/2023 at am   Ascorbic Acid (VITAMIN C WITH ROSE HIPS) 1000 MG tablet Take 5,000 mg by mouth daily in the afternoon.   05/16/2023   aspirin EC 81 MG tablet Take 81 mg by mouth daily. Swallow whole.   05/17/2023 at 0800   benazepril (LOTENSIN) 20 MG tablet Take 1 tablet (20 mg total) by mouth daily. 90 tablet 3  05/17/2023 at am   Cholecalciferol (VITAMIN D3) 50 MCG (2000 UT) TABS Take 2,000 Units by mouth every evening.   05/16/2023 at pm   Coenzyme Q10 (COQ10) 100 MG CAPS Take 100 mg by mouth every morning.   05/17/2023   Cyanocobalamin (B-12) 2000 MCG TABS Take 2,000 mcg by mouth every morning.   05/17/2023   ibuprofen (ADVIL) 200 MG tablet Take 400 mg by mouth 2 (two) times daily.   05/17/2023 at am   levothyroxine (SYNTHROID) 125 MCG tablet Take 2 tablets (250 mcg total) by mouth daily before breakfast. TAKE 2 TABLETS BY MOUTH BEFORE BREAKFAST . (Patient taking differently: Take 250 mcg by mouth daily before breakfast.) 180 tablet 3 05/17/2023 at am   lovastatin (MEVACOR) 40 MG tablet Take 1 tablet (40 mg total) by mouth at bedtime. 90 tablet 3 05/16/2023 at pm   Multiple Vitamin (MULTIVITAMIN) tablet Take 1 tablet by mouth daily with breakfast.   05/17/2023   Semaglutide (RYBELSUS) 14 MG TABS Take 1 tablet (14 mg total) by mouth daily. (Patient taking differently: Take 14 mg by mouth See admin instructions. Take 14 mg by mouth  first thing in the morning, upon waking) 30 tablet 11 05/17/2023 at am   triamterene-hydrochlorothiazide (MAXZIDE-25) 37.5-25 MG tablet Take 1 tablet by mouth daily. 90 tablet 3 05/17/2023 at am   TURMERIC PO Take 1 capsule by mouth every evening.   05/16/2023 at pm   baclofen (LIORESAL) 20 MG tablet Take 0.5 tablets (10 mg total) by mouth 3 (three) times daily as needed for muscle spasms. (Patient not taking: Reported on 05/17/2023) 30 each 0 Not Taking   meclizine (ANTIVERT) 25 MG tablet Take 1 tablet (25 mg total) by mouth 3 (three) times daily as needed for dizziness. (Patient not taking: Reported on 05/17/2023) 30 tablet 0 Not Taking    Assessment: Pharmacy consulted to dose heparin for new onset Afib.  No anticoagulants PTA. SCr 1.31; INR 1.2. Hg 12.9; PLT 181.  No bleeding reported.   05/18/2023 HL 0.24 subtherapeutic on 1700 units/hr Hgb 12.7, plts 159 No bleeding per RN   Goal of Therapy:   Heparin level 0.3-0.7 units/ml Monitor platelets by anticoagulation protocol: Yes   Plan:  Heparin 1500 units IV x 1  Increase Heparin drip to 1900 units/hr Check 6 hr heparin level Daily CBC & heparin level  Arley Phenix RPh 05/18/2023, 6:14 AM

## 2023-05-18 NOTE — Progress Notes (Signed)
2D echo attempted, patient eating. Will try later

## 2023-05-18 NOTE — Progress Notes (Signed)
ANTICOAGULATION CONSULT NOTE - follow up  Pharmacy Consult for heparin Indication: atrial fibrillation  Allergies  Allergen Reactions   Penicillins Other (See Comments)    Family history of allergic reaction    Watermelon [Citrullus Vulgaris] Itching and Other (See Comments)    Makes throat itch     Patient Measurements: Height: 6' (182.9 cm) Weight: (!) 146.1 kg (322 lb 3.2 oz) IBW/kg (Calculated) : 77.6 Heparin Dosing Weight: 111.7 kg  Vital Signs: Temp: 97.7 F (36.5 C) (09/05 1117) Temp Source: Oral (09/05 1117) BP: 112/60 (09/05 1117) Pulse Rate: 129 (09/05 1117)  Labs: Recent Labs    05/17/23 1629 05/18/23 0110 05/18/23 0501 05/18/23 1213  HGB 12.9* 12.7*  --   --   HCT 38.4* 38.5*  --   --   PLT 181 159  --   --   LABPROT 15.5*  --   --   --   INR 1.2  --   --   --   HEPARINUNFRC  --   --  0.24* 0.22*  CREATININE 1.31* 1.23  --   --     Estimated Creatinine Clearance: 91.3 mL/min (by C-G formula based on SCr of 1.23 mg/dL).   Medical History: Past Medical History:  Diagnosis Date   At risk for sleep apnea    STOP-BANG= 6         SENT TO PCP 06-24-2016   Back pain    History of thyroid nodule    s/p  right thyroid lopectomy--  per path report:  follicular adenoma, chronic thyroiditis   Hyperlipidemia    Hypertension    Hypogonadism male    Hypothyroidism    Obesity    Other specified disorders of tendon, right knee    quad tendon tear   SOB (shortness of breath)    Vitamin D deficiency    Wears glasses      Assessment: Pharmacy consulted to dose heparin for new onset Afib.  No anticoagulants PTA. Baseline labs: SCr 1.31. INR 1.2. Hgb 12.9. Plt 181K. No bleeding reported.   Today, 05/18/2023: Heparin level = 0.22 units/mL subtherapeutic and decreased despite bolus and rate increase earlier today CBC: Hgb 12.7, Plt 159K No bleeding or infusion issues per RN No interruptions in therapy per RN. Confirmed heparin running at previously specified  rate.    Goal of Therapy:  Heparin level 0.3-0.7 units/ml Monitor platelets by anticoagulation protocol: Yes   Plan:  Heparin 1700 units IV x 1  Increase heparin infusion rate to 2100 units/hr Check heparin level 6 hours after rate change Daily CBC, heparin level Monitor closely for s/sx of bleeding   Greer Pickerel, PharmD, BCPS Clinical Pharmacist 05/18/2023 2:09 PM

## 2023-05-18 NOTE — TOC CM/SW Note (Signed)
 TOC Brief Assessment   Insurance and Status Insurance coverage has been reviewed  Patient has primary care physician Yes  Home environment has been reviewed Yes  Prior level of function: Independent  Prior/Current Home Services No current home services  Social Determinants of Health Reivew SDOH reviewed no interventions necessary  Readmission risk has been reviewed Yes  Transition of care needs no transition of care needs at this time

## 2023-05-18 NOTE — Progress Notes (Signed)
Mobility Specialist - Progress Note   05/18/23 1040  Mobility  Activity Ambulated with assistance in hallway  Level of Assistance Modified independent, requires aide device or extra time  Assistive Device None  Distance Ambulated (ft) 500 ft  Range of Motion/Exercises Active  Activity Response Tolerated well  Mobility Referral Yes  $Mobility charge 1 Mobility  Mobility Specialist Start Time (ACUTE ONLY) 1010  Mobility Specialist Stop Time (ACUTE ONLY) 1019  Mobility Specialist Time Calculation (min) (ACUTE ONLY) 9 min   Pt received in bed and agreed to mobility, had no issues throughout session, returned to bed with all needs met.  Marilynne Halsted Mobility Specialist

## 2023-05-18 NOTE — Plan of Care (Signed)

## 2023-05-18 NOTE — Progress Notes (Signed)
   05/18/23 2142  Vitals  Temp 99.7 F (37.6 C)  BP 108/79  MAP (mmHg) 90  BP Location Left Arm  BP Method Automatic  Patient Position (if appropriate) Lying  Pulse Rate (!) 126  Pulse Rate Source Monitor  Resp 18  Level of Consciousness  Level of Consciousness Alert  MEWS COLOR  MEWS Score Color Yellow  Oxygen Therapy  SpO2 99 %  Pain Assessment  Pain Scale 0-10  Pain Score 2  Pain Type Acute pain  Pain Location Leg  Pain Orientation Left  MEWS Score  MEWS Temp 0  MEWS Systolic 0  MEWS Pulse 2  MEWS RR 0  MEWS LOC 0  MEWS Score 2  Provider Notification  Provider Name/Title aVirgel Manifold  Date Provider Notified 05/18/23  Time Provider Notified 2150  Method of Notification Page  Notification Reason Other (Comment) (Yellow MEWS)  Provider response See new orders  Date of Provider Response 05/18/23  Time of Provider Response 2200   Pt asymptomatic, HR fluctuating. Adm new med continue to monitor.

## 2023-05-18 NOTE — Progress Notes (Signed)
   05/18/23 0221  Assess: MEWS Score  Temp 99.6 F (37.6 C)  BP (!) 113/57  MAP (mmHg) 71  Pulse Rate (!) 112  Resp 19  SpO2 96 %  Assess: MEWS Score  MEWS Temp 0  MEWS Systolic 0  MEWS Pulse 2  MEWS RR 0  MEWS LOC 0  MEWS Score 2  MEWS Score Color Yellow  Assess: if the MEWS score is Yellow or Red  Were vital signs accurate and taken at a resting state? Yes  Does the patient meet 2 or more of the SIRS criteria? Yes  Does the patient have a confirmed or suspected source of infection? Yes  MEWS guidelines implemented  Yes, yellow  Treat  MEWS Interventions Considered administering scheduled or prn medications/treatments as ordered  Take Vital Signs  Increase Vital Sign Frequency  Yellow: Q2hr x1, continue Q4hrs until patient remains green for 12hrs  Escalate  MEWS: Escalate Yellow: Discuss with charge nurse and consider notifying provider and/or RRT  Notify: Charge Nurse/RN  Name of Charge Nurse/RN Notified vera, RN  Provider Notification  Provider Name/Title aVirgel Manifold  Date Provider Notified 05/18/23  Time Provider Notified 0230  Method of Notification Face-to-face  Notification Reason Other (Comment) (cont yellow mews)  Provider response No new orders  Date of Provider Response 05/18/23  Time of Provider Response 0230  Assess: SIRS CRITERIA  SIRS Temperature  0  SIRS Pulse 1  SIRS Respirations  0  SIRS WBC 0  SIRS Score Sum  1   Pt continues to be yellow. Asymptomatic cont to monitor

## 2023-05-18 NOTE — Progress Notes (Signed)
PROGRESS NOTE    MAHIN DINSMOOR  NFA:[id number] DOB: May 04, 1961 DOA: 05/17/2023 PCP: Tresa Garter, MD   Brief Narrative:  Randy [name] is a 62 y.o. male with medical history significant of morbid obesity, hypertension, hyperlipidemia, type 2 diabetes and hypothyroidism s/p partial thyroidectomy who presents with left lower extremity pain, patient noted to have profound blanching erythema concerning for cellulitis.  Patient reports recent anterior left [place] to [place].  Patient had not been on prior antibiotics, at intake patient's telemetry and EKG were consistent with A-fib which is a new diagnosis for the patient.  Patient admitted for further evaluation treatment of sepsis secondary to cellulitis with concurrent new onset A-fib with RVR.  Assessment & Plan:   Principal Problem:   Sepsis (HCC) Active Problems:   Paroxysmal atrial fibrillation with RVR (HCC)   Mixed hyperlipidemia   Hypothyroidism   OBESITY, MORBID   Essential hypertension   Type 2 diabetes mellitus (HCC)   Cellulitis   Hypokalemia  Sepsis (HCC) LLE cellulitis, POA -secondary to left lower extremity cellulitis from [place] wound ([place]) -CT left tib-fib pending -DVT study negative -Continue IV vancomycin and Zosyn; keep on continuous IV fluid until p.o. intake is more appropriate   Paroxysmal atrial fibrillation with RVR (HCC), new diagnosis - A-fib/RVR questionably provoked given above, rate much more appropriately controlled with supportive care - Goal rate less than 120, symptomatic - Echocardiogram pending - TSH within normal limits - ChadsVasc of 2 (HTN/DM) places patient at 2.2% risk of VTE annually - unclear however if patient is routinely/commonly in afib vs transiently in afib (provoked) by his infection as above. Will discuss prior to discharge the risks benefits of anticoagulation.  Mixed hyperlipidemia Continue statin     Hypokalemia - Replete with oral supplement  Type 2 diabetes mellitus (HCC) -Controlled. Last A1C of 5.3   Essential hypertension - Borderline soft with sepsis. Hold antihypertensives for now and monitor.   OBESITY, MORBID - BMI of 43 - pt on Rybelsus outpatient   Hypothyroidism Continue levothyoxine   DVT prophylaxis:    Code Status:   Code Status: Full Code  Family Communication: None present  Status is: Inpatient  Dispo: The patient is from: Home              Anticipated d/c is to: Home              Anticipated d/c date is: 24-48h              Patient currently NOT medically stable for discharge  Consultants:  None  Procedures:  None  Antimicrobials:  Zosyn/Vancomycin   Subjective: No acute issues/events overnight - pain well controlled no further swelling in the left lower extremity-denies fevers chills nausea vomiting diarrhea constipation.  Objective: Vitals:   05/17/23 1953 05/17/23 2331 05/18/23 0221 05/18/23 0646  BP: (!) 128/105 110/83 (!) 113/57 98/78  Pulse: (!) 112 91 (!) 112 (!) 106  Resp: 18 18 19 18   Temp: 98.7 F (37.1 C) 100.2 F (37.9 C) 99.6 F (37.6 C) 99 F (37.2 C)  TempSrc: Oral Oral Oral Oral  SpO2: 100% 94% 96% 96%  Weight:      Height:        Intake/Output Summary (Last 24 hours) at 05/18/2023 0714 Last data filed at 05/18/2023 0534 Gross per 24 hour  Intake 1468.32 ml  Output 425 ml  Net 1043.32 ml   [employer]  05/17/23 1622  Weight: (!) 146.1 kg    Examination:  General:  Pleasantly resting in bed, No acute distress. HEENT:  Normocephalic atraumatic.  Sclerae nonicteric, noninjected.  Extraocular movements intact bilaterally. Neck:  Without mass or deformity.  Trachea is midline. Lungs:  Clear to auscultate bilaterally without rhonchi, wheeze, or rales. Heart:  Regular rate and rhythm.  Without murmurs, rubs, or gallops. Abdomen:  Soft, nontender, nondistended.  Without guarding or rebound. Extremities:  Circumferential erythema of the left lower extremity notably distal to the knee spreading distally to proximal side of ankle.  Left anterior tibia healing wound with black eschar surrounded by blanching erythema.  Posterior left lower extremity at the gastrocnemius head with bullae, intact without leakage or drainage. Skin: Erythema of the left lower extremity otherwise within normal limits  Data Reviewed: I have personally reviewed following labs and imaging studies  CBC: Recent Labs  Lab 05/17/23 1629 05/18/23 0110  WBC 6.4 4.5  NEUTROABS 5.0  --   HGB 12.9* 12.7*  HCT 38.4* 38.5*  MCV 90.4 92.5  PLT 181 159   Basic Metabolic Panel: Recent Labs  Lab 05/17/23 1629 05/18/23 0110  NA 133* 133*  K 3.4* 3.2*  CL 98 99  CO2 24 24  GLUCOSE 96 120*  BUN 23 19  CREATININE 1.31* 1.23  CALCIUM 8.7* 8.2*   GFR: Estimated Creatinine Clearance: 91.3 mL/min (by C-G formula based on SCr of 1.23 mg/dL).  Liver Function Tests: Recent Labs  Lab 05/17/23 1629  AST 23  ALT 33  ALKPHOS 45  BILITOT 1.1  PROT 6.9  ALBUMIN 4.0   Coagulation Profile: Recent Labs  Lab 05/17/23 1629  INR 1.2   Thyroid Function Tests: Recent Labs    05/18/23 0110  TSH 0.393   Sepsis Labs: Recent Labs  Lab 05/17/23 1628 05/17/23 2111 05/18/23 0110  LATICACIDVEN 1.6 0.8 1.1    No results found for this or any previous visit (from the past 240 hour(s)).   Radiology Studies: DG Tibia/Fibula Left  Result Date: 05/17/2023 CLINICAL DATA:  Infection to the left lower leg with dull pain and redness. Cellulitis. Query air. Query sepsis. EXAM: LEFT TIBIA AND FIBULA - 2 VIEW COMPARISON:  Left knee 05/07/2016 FINDINGS: Mild degenerative changes in the left knee and left ankle. No acute fracture or dislocation is demonstrated in the tibia or fibula. No focal bone lesion or bone destruction. No cortical erosion or sclerosis to suggest evidence of osteomyelitis. Old ununited ossicles anterior to the knee  may represent loose bodies or synovial osteochondromatosis. Amorphous soft tissue calcifications posterior to the proximal tibia may represent vascular or dystrophic calcification. No radiopaque soft tissue foreign bodies. No soft tissue gas. IMPRESSION: 1. No acute bony abnormalities. 2. Degenerative changes in the left knee and ankle. 3. No radiopaque soft tissue foreign bodies or soft tissue gas demonstrated. Electronically Signed   By: Burman Nieves M.D.   On: 05/17/2023 18:22   DG Chest Port 1 View  Result Date: 05/17/2023 CLINICAL DATA:  Infection on left lower leg. Dull pain and redness. Sepsis. EXAM: PORTABLE CHEST 1 VIEW COMPARISON:  01/28/2005 FINDINGS: Cardiomegaly. Aortic atherosclerotic calcification. No focal consolidation, pleural effusion, or pneumothorax. No displaced rib fractures. IMPRESSION: No active disease. Electronically Signed   By: Minerva Fester M.D.   On: 05/17/2023 18:21    Scheduled Meds:  vitamin C with rose hips  500 mg Oral Q1500   aspirin EC  81 mg Oral Daily   cholecalciferol  2,000  Units Oral QPM   cyanocobalamin  2,000 mcg Oral q morning   ibuprofen  400 mg Oral BID   levothyroxine  250 mcg Oral QAC breakfast   pravastatin  40 mg Oral q1800   Continuous Infusions:  sodium chloride 10 mL/hr at 05/17/23 1838   sodium chloride Stopped (05/17/23 1725)   sodium chloride 75 mL/hr at 05/18/23 0000   heparin 1,900 Units/hr (05/18/23 0628)   piperacillin-tazobactam (ZOSYN)  IV 3.375 g (05/18/23 0534)   vancomycin       LOS: 1 day   Time spent:  Azucena Fallen, DO [facility]  If 7PM-7AM, please contact night-coverage [email or web address]  05/18/2023, 7:14 AM

## 2023-05-19 ENCOUNTER — Inpatient Hospital Stay (HOSPITAL_COMMUNITY): Payer: BC Managed Care – PPO

## 2023-05-19 DIAGNOSIS — E782 Mixed hyperlipidemia: Secondary | ICD-10-CM | POA: Diagnosis not present

## 2023-05-19 DIAGNOSIS — L03116 Cellulitis of left lower limb: Secondary | ICD-10-CM | POA: Diagnosis not present

## 2023-05-19 DIAGNOSIS — I480 Paroxysmal atrial fibrillation: Secondary | ICD-10-CM | POA: Diagnosis not present

## 2023-05-19 DIAGNOSIS — A419 Sepsis, unspecified organism: Secondary | ICD-10-CM | POA: Diagnosis not present

## 2023-05-19 LAB — CREATININE, SERUM
Creatinine, Ser: 1.18 mg/dL (ref 0.61–1.24)
GFR, Estimated: >60 mL/min (ref >60)

## 2023-05-19 LAB — CBC
HCT: 35.8 % — ABNORMAL LOW (ref 39.0–52.0)
Hemoglobin: 11.7 g/dL — ABNORMAL LOW (ref 13.0–17.0)
MCH: 30.4 pg (ref 26.0–34.0)
MCHC: 32.7 g/dL (ref 30.0–36.0)
MCV: 93 fL (ref 80.0–100.0)
Platelets: 153 10*3/uL (ref 150–400)
RBC: 3.85 MIL/uL — ABNORMAL LOW (ref 4.22–5.81)
RDW: 13.4 % (ref 11.5–15.5)
WBC: 3.7 10*3/uL — ABNORMAL LOW (ref 4.0–10.5)
nRBC: 0 % (ref 0.0–0.2)

## 2023-05-19 LAB — HEPARIN LEVEL (UNFRACTIONATED): Heparin Unfractionated: 0.41 [IU]/mL (ref 0.30–0.70)

## 2023-05-19 MED ORDER — SODIUM CHLORIDE 0.9 % IV BOLUS
250.0000 mL | Freq: Once | INTRAVENOUS | Status: AC
  Administered 2023-05-19: 250 mL via INTRAVENOUS

## 2023-05-19 MED ORDER — INFLUENZA VIRUS VACC SPLIT PF (FLUZONE) 0.5 ML IM SUSY
0.5000 mL | PREFILLED_SYRINGE | INTRAMUSCULAR | Status: DC
  Filled 2023-05-19: qty 0.5

## 2023-05-19 MED ORDER — APIXABAN 5 MG PO TABS
5.0000 mg | ORAL_TABLET | Freq: Two times a day (BID) | ORAL | Status: DC
  Administered 2023-05-19 – 2023-05-21 (×5): 5 mg via ORAL
  Filled 2023-05-19 (×5): qty 1

## 2023-05-19 MED ORDER — DILTIAZEM HCL ER 60 MG PO CP12
60.0000 mg | ORAL_CAPSULE | Freq: Two times a day (BID) | ORAL | Status: DC
  Administered 2023-05-19 – 2023-05-21 (×5): 60 mg via ORAL
  Filled 2023-05-19 (×5): qty 1

## 2023-05-19 MED ORDER — METOPROLOL TARTRATE 5 MG/5ML IV SOLN: 2.5000 mg | Freq: Once | INTRAVENOUS | Status: DC

## 2023-05-19 NOTE — Progress Notes (Signed)
PROGRESS NOTE    Randy [name]  UEA:[id number] DOB: 07/29/1961 DOA: 05/17/2023 PCP: Tresa Garter, MD   Brief Narrative:  Randy [name] is a 62 y.o. male with medical history significant of morbid obesity, hypertension, hyperlipidemia, type 2 diabetes and hypothyroidism s/p partial thyroidectomy who presents with left lower extremity pain, patient noted to have profound blanching erythema concerning for cellulitis.  Patient reports recent anterior left leg [place] approximately 10 days prior to admission.  Patient had not been on prior antibiotics, at intake patient's telemetry and EKG were consistent with A-fib which is a new diagnosis for the patient.  Patient admitted for further evaluation treatment of sepsis secondary to cellulitis with concurrent new onset A-fib with RVR.  Assessment & Plan:   Principal Problem:   Sepsis (HCC) Active Problems:   Paroxysmal atrial fibrillation with RVR (HCC)   Mixed hyperlipidemia   Hypothyroidism   OBESITY, MORBID   Essential hypertension   Type 2 diabetes mellitus (HCC)   Cellulitis   Hypokalemia  Sepsis (HCC) LLE cellulitis, POA -secondary to left lower extremity cellulitis from freshwater wound ([place]) -CT left tib-fib unremarkable for abscess/fluid collection -DVT study negative -Continue IV vancomycin and Zosyn; keep on continuous IV fluid until p.o. intake is more appropriate - likely transition to Cephalexin 500 qid+Levaquin 750 QD; Consider adding doxy 100bid(unclear if vibrio exposure)  Paroxysmal atrial fibrillation with RVR (HCC), new diagnosis - A-fib/RVR questionably provoked given above, rate much more appropriately controlled with supportive care - Goal rate less than 120, symptomatic - Echocardiogram pending - TSH within normal limits - ChadsVasc of 2 (HTN/DM) places patient at 2.2% risk of VTE annually - continue anticoagulation and rate control at DC - follow up for repeat evaluation with PCP vs  cardiology  Mixed hyperlipidemia Continue statin    Hypokalemia - Replete with oral supplement  Type 2 diabetes mellitus (HCC) -Controlled. Last A1C of 5.3   Essential hypertension - Borderline soft with sepsis. Hold antihypertensives for now and monitor.   OBESITY, MORBID - BMI of 43 - pt on Rybelsus outpatient   Hypothyroidism Continue levothyoxine  DVT prophylaxis: apixaban (ELIQUIS) tablet 5 mg  Code Status:   Code Status: Full Code Family Communication: None present  Status is: Inpatient Dispo: The patient is from: Home              Anticipated d/c is to: Home              Anticipated d/c date is: 24-48h              Patient currently NOT medically stable for discharge  Consultants:  None  Procedures:  None  Antimicrobials:  Zosyn/Vancomycin   Subjective: No acute issues/events overnight - pain well controlled no further swelling in the left lower extremity-denies fevers chills nausea vomiting diarrhea constipation.  Objective: Vitals:   05/18/23 1957 05/18/23 2142 05/19/23 0016 05/19/23 0422  BP: 126/82 108/79 116/71 106/74  Pulse: (!) 55 (!) 126 95 (!) 101  Resp: 20 18 16 18   Temp: 99.6 F (37.6 C) 99.7 F (37.6 C) 98.1 F (36.7 C) 98.2 F (36.8 C)  TempSrc:   Oral   SpO2: 100% 99% 97% 98%  Weight:      Height:        Intake/Output Summary (Last 24 hours) at 05/19/2023 0738 Last data filed at 05/19/2023 0600 Gross per 24 hour  Intake 1944.75 ml  Output 1300 ml  Net 644.75 ml  Filed Weights   05/17/23 1622  Weight: (!) 146.1 kg    Examination:  General:  Pleasantly resting in bed, No acute distress. HEENT:  Normocephalic atraumatic.  Sclerae nonicteric, noninjected.  Extraocular movements intact bilaterally. Neck:  Without mass or deformity.  Trachea is midline. Lungs:  Clear to auscultate bilaterally without rhonchi, wheeze, or rales. Heart:  Regular rate and rhythm.  Without murmurs, rubs, or gallops. Abdomen:  Soft, nontender,  nondistended.  Without guarding or rebound. Extremities: Circumferential erythema of the left lower extremity notably distal to the knee spreading distally to proximal side of ankle.  Left anterior tibia healing wound with black eschar surrounded by blanching erythema.  Posterior left lower extremity at the gastrocnemius head with bullae - intact without leakage or drainage. Skin: Erythema of the left lower extremity otherwise within normal limits  Data Reviewed: I have personally reviewed following labs and imaging studies  CBC: Recent Labs  Lab 05/17/23 1629 05/18/23 0110 05/19/23 0541  WBC 6.4 4.5 3.7*  NEUTROABS 5.0  --   --   HGB 12.9* 12.7* 11.7*  HCT 38.4* 38.5* 35.8*  MCV 90.4 92.5 93.0  PLT 181 159 153   Basic Metabolic Panel: Recent Labs  Lab 05/17/23 1629 05/18/23 0110 05/18/23 2305 05/19/23 0541  NA 133* 133* 132*  --   K 3.4* 3.2* 3.7  --   CL 98 99 100  --   CO2 24 24 23   --   GLUCOSE 96 120* 108*  --   BUN 23 19 17   --   CREATININE 1.31* 1.23 1.27* 1.18  CALCIUM 8.7* 8.2* 7.9*  --   MG  --   --  2.1  --   PHOS  --   --  2.6  --    GFR: Estimated Creatinine Clearance: 95.2 mL/min (by C-G formula based on SCr of 1.18 mg/dL).  Liver Function Tests: Recent Labs  Lab 05/17/23 1629  AST 23  ALT 33  ALKPHOS 45  BILITOT 1.1  PROT 6.9  ALBUMIN 4.0   Coagulation Profile: Recent Labs  Lab 05/17/23 1629  INR 1.2   Thyroid Function Tests: Recent Labs    05/18/23 0110  TSH 0.393   Sepsis Labs: Recent Labs  Lab 05/17/23 1628 05/17/23 2111 05/18/23 0110  LATICACIDVEN 1.6 0.8 1.1    Recent Results (from the past 240 hour(s))  Culture, blood (Routine x 2)     Status: None (Preliminary result)   Collection Time: 05/17/23  4:29 PM   Specimen: BLOOD LEFT HAND  Result Value Ref Range Status   Specimen Description   Final    BLOOD LEFT HAND Performed at [facility], [address], [place], [place] [address]    Special Requests   Final     BOTTLES DRAWN AEROBIC AND ANAEROBIC Blood Culture results may not be optimal due to an inadequate volume of blood received in culture bottles Performed at [facility], [address] [place], [place] [address]    Culture   Final    NO GROWTH < 12 HOURS Performed at [facility], [address], [place], [place] [address]    Report Status PENDING  Incomplete  Culture, blood (Routine x 2)     Status: None (Preliminary result)   Collection Time: 05/17/23  5:11 PM   Specimen: BLOOD  Result Value Ref Range Status   Specimen Description   Final    BLOOD RIGHT ANTECUBITAL Performed at [facility]  [address], [address] [employer] [place], [place] [address]    Special Requests   Final    BOTTLES DRAWN AEROBIC AND ANAEROBIC Blood Culture adequate volume Performed at [facility], [address] [place], [place] [address]    Culture   Final    NO GROWTH < 12 HOURS Performed at [facility], [address] [place], [place] [address]    Report Status PENDING  Incomplete     Radiology Studies: CT TIBIA FIBULA LEFT W CONTRAST  Result Date: 05/18/2023 CLINICAL DATA:  Foot swelling, diabetic, osteomyelitis suspected, xray done rule out cellulitis, possible abscess EXAM: CT OF THE LOWER LEFT EXTREMITY WITH CONTRAST TECHNIQUE: Multidetector CT imaging of the lower left extremity was performed according to the standard protocol following intravenous contrast administration. RADIATION DOSE REDUCTION: This exam was performed according to the departmental dose-optimization program which includes automated exposure control, adjustment of the mA and/or kV according to patient size and/or use of iterative reconstruction technique. CONTRAST:  OMNIPAQUE IOHEXOL 300 MG/ML  SOLN COMPARISON:  X-ray 05/17/2023 FINDINGS: Bones/Joint/Cartilage Left tibia and fibula intact. No fracture. No dislocation. 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 Mild degenerative  changes at the ankle and hindfoot. There is some ossification in the distal tibiofibular syndesmosis likely sequela of remote trauma. No focal erosion or aggressive periosteal elevation. No lytic or sclerotic bone lesion. Ligaments Suboptimally assessed by CT. Muscles and Tendons No acute musculotendinous abnormality by CT. Thickened patellar tendon. Soft tissues Circumferential subcutaneous edema of the lower leg, overall mild. No organized fluid collection. No soft tissue gas. IMPRESSION: 1. Circumferential subcutaneous edema of the lower leg. No organized fluid collection. No soft tissue gas. 2. No CT evidence of osteomyelitis. 3. 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 4. Thickened patellar tendon, likely related to chronic tendinopathy. Electronically Signed   By: Duanne Guess D.O.   On: 05/18/2023 13:39   VAS [place] LOWER EXTREMITY VENOUS (DVT)  Result Date: 05/18/2023  Lower Venous DVT Study Patient Name:  Randy [name]  Date of Exam:   05/18/2023 Medical Rec #: [id number]      Accession #:    [id number] Date of Birth: [birth year]       Patient Gender: M Patient Age:   35 years Exam Location:  [facility] Procedure:      VAS [place] LOWER EXTREMITY VENOUS (DVT) Referring Phys: CHING TU --------------------------------------------------------------------------------  Indications: Swelling.  Risk Factors: None identified. Limitations: Body habitus and poor ultrasound/tissue interface. Comparison Study: No prior studies. Performing Technologist: Chanda Busing RVT  Examination Guidelines: A complete evaluation includes B-mode imaging, spectral Doppler, color Doppler, and power Doppler as needed of all accessible portions of each vessel. Bilateral testing is considered an integral part of a complete examination. Limited examinations for reoccurring indications may be performed as noted. The reflux portion of the exam is performed with the patient in reverse Trendelenburg.   +-----+---------------+---------+-----------+----------+--------------+ RIGHTCompressibilityPhasicitySpontaneityPropertiesThrombus Aging +-----+---------------+---------+-----------+----------+--------------+ CFV  Full           Yes      Yes                                 +-----+---------------+---------+-----------+----------+--------------+   +---------+---------------+---------+-----------+----------+-------------------+ LEFT     CompressibilityPhasicitySpontaneityPropertiesThrombus Aging      +---------+---------------+---------+-----------+----------+-------------------+ CFV      Full           Yes  Yes                                      +---------+---------------+---------+-----------+----------+-------------------+ SFJ      Full                                                             +---------+---------------+---------+-----------+----------+-------------------+ FV Prox  Full                                                             +---------+---------------+---------+-----------+----------+-------------------+ FV Mid   Full                                                             +---------+---------------+---------+-----------+----------+-------------------+ FV DistalFull                                                             +---------+---------------+---------+-----------+----------+-------------------+ PFV      Full                                                             +---------+---------------+---------+-----------+----------+-------------------+ POP      Full           Yes      Yes                                      +---------+---------------+---------+-----------+----------+-------------------+ PTV      Full                                                             +---------+---------------+---------+-----------+----------+-------------------+ PERO                                                   Not well visualized +---------+---------------+---------+-----------+----------+-------------------+    Summary: RIGHT: - No evidence of common femoral vein obstruction.   LEFT: - There is no evidence of deep vein thrombosis in the lower extremity. However, portions of this examination were limited- see technologist comments above.  - No cystic structure found  in the popliteal fossa.  *See table(s) above for measurements and observations.    Preliminary    DG Tibia/Fibula Left  Result Date: 05/17/2023 CLINICAL DATA:  Infection to the left lower leg with dull pain and redness. Cellulitis. Query air. Query sepsis. EXAM: LEFT TIBIA AND FIBULA - 2 VIEW COMPARISON:  Left knee 05/07/2016 FINDINGS: Mild degenerative changes in the left knee and left ankle. No acute fracture or dislocation is demonstrated in the tibia or fibula. No focal bone lesion or bone destruction. No cortical erosion or sclerosis to suggest evidence of osteomyelitis. Old ununited ossicles anterior to the knee may represent loose bodies or synovial osteochondromatosis. Amorphous soft tissue calcifications posterior to the proximal tibia may represent vascular or dystrophic calcification. No radiopaque soft tissue foreign bodies. No soft tissue gas. IMPRESSION: 1. No acute bony abnormalities. 2. Degenerative changes in the left knee and ankle. 3. No radiopaque soft tissue foreign bodies or soft tissue gas demonstrated. Electronically Signed   By: Burman Nieves M.D.   On: 05/17/2023 18:22   DG Chest Port 1 View  Result Date: 05/17/2023 CLINICAL DATA:  Infection on left lower leg. Dull pain and redness. Sepsis. EXAM: PORTABLE CHEST 1 VIEW COMPARISON:  01/28/2005 FINDINGS: Cardiomegaly. Aortic atherosclerotic calcification. No focal consolidation, pleural effusion, or pneumothorax. No displaced rib fractures. IMPRESSION: No active disease. Electronically Signed   By: Minerva Fester M.D.   On: 05/17/2023 18:21    Scheduled Meds:   vitamin C with rose hips  500 mg Oral Q1500   aspirin EC  81 mg Oral Daily   cholecalciferol  2,000 Units Oral QPM   cyanocobalamin  2,000 mcg Oral q morning   ibuprofen  400 mg Oral BID   influenza vac split trivalent PF  0.5 mL Intramuscular Tomorrow-1000   levothyroxine  250 mcg Oral QAC breakfast   pravastatin  40 mg Oral q1800   Semaglutide  14 mg Oral Daily   Continuous Infusions:  sodium chloride 10 mL/hr at 05/17/23 1838   sodium chloride Stopped (05/17/23 1725)   sodium chloride 50 mL/hr at 05/19/23 0539   heparin 2,100 Units/hr (05/18/23 2032)   piperacillin-tazobactam (ZOSYN)  IV 3.375 g (05/19/23 0600)   vancomycin 2,000 mg (05/18/23 2103)     LOS: 2 days   Time spent:  Azucena Fallen, DO [facility]  If 7PM-7AM, please contact night-coverage [email or web address]  05/19/2023, 7:38 AM

## 2023-05-19 NOTE — Progress Notes (Signed)
Mobility Specialist - Progress Note   05/19/23 1004  Mobility  Activity Ambulated with assistance in hallway  Level of Assistance Modified independent, requires aide device or extra time  Assistive Device None  Distance Ambulated (ft) 500 ft  Range of Motion/Exercises Active  Activity Response Tolerated fair  Mobility Referral Yes  $Mobility charge 1 Mobility  Mobility Specialist Start Time (ACUTE ONLY) [id number]  Mobility Specialist Stop Time (ACUTE ONLY) 1003  Mobility Specialist Time Calculation (min) (ACUTE ONLY) 11 min   Pt received in bed and agreed to mobility, began having labored breathing. HR was at 186 during ambulation, notified nurse and returned to bed with all needs met.  Marilynne Halsted Mobility Specialist

## 2023-05-19 NOTE — Progress Notes (Signed)
Attempted Echo; Patients heart rate was 130-175 bpm. Unable to complete Echo.  Dondra Prader RVT RCS

## 2023-05-19 NOTE — Progress Notes (Signed)
Attempted Echo; patient asked Tech to come back later because he wants to finish eating.  Dondra Prader RVT RCS

## 2023-05-19 NOTE — Progress Notes (Signed)
ANTICOAGULATION CONSULT NOTE - follow up  Pharmacy Consult for heparin Indication: atrial fibrillation  Allergies  Allergen Reactions   Penicillins Other (See Comments)    Family history of allergic reaction    Watermelon [Citrullus Vulgaris] Itching and Other (See Comments)    Makes throat itch     Patient Measurements: Height: 6' (182.9 cm) Weight: (!) 146.1 kg (322 lb 3.2 oz) IBW/kg (Calculated) : 77.6 Heparin Dosing Weight: 111.7 kg  Vital Signs: Temp: 98.2 F (36.8 C) (09/06 0422) Temp Source: Oral (09/06 0016) BP: 106/74 (09/06 0422) Pulse Rate: 101 (09/06 0422)  Labs: Recent Labs    05/17/23 1629 05/18/23 0110 05/18/23 0501 05/18/23 1213 05/18/23 2137 05/18/23 2305 05/19/23 0541  HGB 12.9* 12.7*  --   --   --   --  11.7*  HCT 38.4* 38.5*  --   --   --   --  35.8*  PLT 181 159  --   --   --   --  153  LABPROT 15.5*  --   --   --   --   --   --   INR 1.2  --   --   --   --   --   --   HEPARINUNFRC  --   --    < > 0.22* 0.38  --  0.41  CREATININE 1.31* 1.23  --   --   --  1.27* 1.18   < > = values in this interval not displayed.    Estimated Creatinine Clearance: 95.2 mL/min (by C-G formula based on SCr of 1.18 mg/dL).   Medical History: Past Medical History:  Diagnosis Date   At risk for sleep apnea    STOP-BANG= 6         SENT TO PCP 06-24-2016   Back pain    History of thyroid nodule    s/p  right thyroid lopectomy--  per path report:  follicular adenoma, chronic thyroiditis   Hyperlipidemia    Hypertension    Hypogonadism male    Hypothyroidism    Obesity    Other specified disorders of tendon, right knee    quad tendon tear   SOB (shortness of breath)    Vitamin D deficiency    Wears glasses      Assessment: Pharmacy consulted to dose heparin for new onset Afib.  No anticoagulants PTA. Baseline labs: SCr 1.31. INR 1.2. Hgb 12.9. Plt 181K. No bleeding reported.   Today, 05/19/2023: Heparin level therapeutic on 2100 units/hr. CBC: Hgb  trending down slightly but >11. Pltc wnl. No bleeding or infusion issues per RN.    Goal of Therapy:  Heparin level 0.3-0.7 units/ml Monitor platelets by anticoagulation protocol: Yes   Plan:  Continue heparin infusion rate at 2100 units/hr Daily CBC, heparin level Monitor for s/sx of bleeding  Elie Goody, PharmD, BCPS Clinical Pharmacist 05/19/2023  6:40 AM

## 2023-05-19 NOTE — Progress Notes (Signed)
Mobility Specialist - Progress Note   05/19/23 1331  Mobility  Activity Ambulated with assistance in hallway  Level of Assistance Independent after set-up  Assistive Device None  Distance Ambulated (ft) 200 ft  Range of Motion/Exercises Active  Activity Response Tolerated well  Mobility Referral Yes  $Mobility charge 1 Mobility  Mobility Specialist Start Time (ACUTE ONLY) 1320  Mobility Specialist Stop Time (ACUTE ONLY) 1326  Mobility Specialist Time Calculation (min) (ACUTE ONLY) 6 min   Received in bed and agreed to mobility, no issues and returned to bed for meds with all needs met, nurse in room.  Marilynne Halsted Mobility Specialist

## 2023-05-19 NOTE — Plan of Care (Signed)

## 2023-05-19 NOTE — Progress Notes (Addendum)
ANTICOAGULATION CONSULT NOTE  Pharmacy Consult for apixaban Indication: atrial fibrillation  Allergies  Allergen Reactions   Penicillins Other (See Comments)    Family history of allergic reaction    Watermelon [Citrullus Vulgaris] Itching and Other (See Comments)    Makes throat itch     Patient Measurements: Height: 6' (182.9 cm) Weight: (!) 146.1 kg (322 lb 3.2 oz) IBW/kg (Calculated) : 77.6   Vital Signs: Temp: 98.2 F (36.8 C) (09/06 0422) Temp Source: Oral (09/06 0016) BP: 102/70 (09/06 0805) Pulse Rate: 111 (09/06 0805)  Labs: Recent Labs    05/17/23 1629 05/18/23 0110 05/18/23 0501 05/18/23 1213 05/18/23 2137 05/18/23 2305 05/19/23 0541  HGB 12.9* 12.7*  --   --   --   --  11.7*  HCT 38.4* 38.5*  --   --   --   --  35.8*  PLT 181 159  --   --   --   --  153  LABPROT 15.5*  --   --   --   --   --   --   INR 1.2  --   --   --   --   --   --   HEPARINUNFRC  --   --    < > 0.22* 0.38  --  0.41  CREATININE 1.31* 1.23  --   --   --  1.27* 1.18   < > = values in this interval not displayed.    Estimated Creatinine Clearance: 95.2 mL/min (by C-G formula based on SCr of 1.18 mg/dL).   Medical History: Past Medical History:  Diagnosis Date   At risk for sleep apnea    STOP-BANG= 6         SENT TO PCP 06-24-2016   Back pain    History of thyroid nodule    s/p  right thyroid lopectomy--  per path report:  follicular adenoma, chronic thyroiditis   Hyperlipidemia    Hypertension    Hypogonadism male    Hypothyroidism    Obesity    Other specified disorders of tendon, right knee    quad tendon tear   SOB (shortness of breath)    Vitamin D deficiency    Wears glasses      Assessment: 62 y/o M with new diagnosis of atrial fibrillation.  Was on IV heparin this admission.  New orders received from attending physician to DC heparin and begin apixaban with pharmacy dosing assistance.  Potential drug/supplement and drug/drug interactions: -patient reports  turmeric as a home dietary supplement -currently on ibuprofen 400 mg BID   Plan:  Apixaban 5 mg po BID, first dose given this AM. Patient anticoagulant teaching and Eliquis voucher provided by Clydell Hakim, Pharmacy Student. Patient is advised to avoid turmeric as a supplement while on Eliquis (may increase bleeding risk due to antiplatelet effect). Will defer to attending MD regarding risk/benefit of continued scheduled ibuprofen given history of back pain.  Elie Goody, PharmD, BCPS Clinical Pharmacist 05/19/2023  2:14 PM

## 2023-05-19 NOTE — Discharge Instructions (Signed)

## 2023-05-19 NOTE — Plan of Care (Signed)

## 2023-05-20 ENCOUNTER — Inpatient Hospital Stay (HOSPITAL_COMMUNITY): Payer: BC Managed Care – PPO

## 2023-05-20 DIAGNOSIS — I4891 Unspecified atrial fibrillation: Secondary | ICD-10-CM

## 2023-05-20 DIAGNOSIS — A419 Sepsis, unspecified organism: Secondary | ICD-10-CM | POA: Diagnosis not present

## 2023-05-20 DIAGNOSIS — E039 Hypothyroidism, unspecified: Secondary | ICD-10-CM | POA: Diagnosis not present

## 2023-05-20 DIAGNOSIS — I480 Paroxysmal atrial fibrillation: Secondary | ICD-10-CM | POA: Diagnosis not present

## 2023-05-20 DIAGNOSIS — E782 Mixed hyperlipidemia: Secondary | ICD-10-CM | POA: Diagnosis not present

## 2023-05-20 LAB — CBC
HCT: 36.5 % — ABNORMAL LOW (ref 39.0–52.0)
Hemoglobin: 11.6 g/dL — ABNORMAL LOW (ref 13.0–17.0)
MCH: 29.7 pg (ref 26.0–34.0)
MCHC: 31.8 g/dL (ref 30.0–36.0)
MCV: 93.4 fL (ref 80.0–100.0)
Platelets: 174 10*3/uL (ref 150–400)
RBC: 3.91 MIL/uL — ABNORMAL LOW (ref 4.22–5.81)
RDW: 13.5 % (ref 11.5–15.5)
WBC: 3.3 10*3/uL — ABNORMAL LOW (ref 4.0–10.5)
nRBC: 0 % (ref 0.0–0.2)

## 2023-05-20 LAB — ECHOCARDIOGRAM COMPLETE
AR max vel: 3.22 cm2
AV Area VTI: 3.32 cm2
AV Area mean vel: 3.13 cm2
AV Mean grad: 4 mmHg
AV Peak grad: 7.6 mmHg
Ao pk vel: 1.38 m/s
Area-P 1/2: 4.06 cm2
Height: 72 in
S' Lateral: 3.8 cm
Weight: 5155.2 [oz_av]

## 2023-05-20 LAB — BASIC METABOLIC PANEL
Anion gap: 10 (ref 5–15)
BUN: 14 mg/dL (ref 8–23)
CO2: 21 mmol/L — ABNORMAL LOW (ref 22–32)
Calcium: 8.3 mg/dL — ABNORMAL LOW (ref 8.9–10.3)
Chloride: 104 mmol/L (ref 98–111)
Creatinine, Ser: 1 mg/dL (ref 0.61–1.24)
GFR, Estimated: >60 mL/min (ref >60)
Glucose, Bld: 89 mg/dL (ref 70–99)
Potassium: 3.8 mmol/L (ref 3.5–5.1)
Sodium: 135 mmol/L (ref 135–145)

## 2023-05-20 MED ORDER — PERFLUTREN LIPID MICROSPHERE
1.0000 mL | INTRAVENOUS | Status: AC | PRN
  Administered 2023-05-20: 2 mL via INTRAVENOUS

## 2023-05-20 MED ORDER — TRIAMTERENE-HCTZ 37.5-25 MG PO TABS
1.0000 | ORAL_TABLET | Freq: Every day | ORAL | Status: DC
  Administered 2023-05-20 – 2023-05-21 (×2): 1 via ORAL
  Filled 2023-05-20 (×3): qty 1

## 2023-05-20 NOTE — Progress Notes (Signed)
PROGRESS NOTE    Randy [name]  SAY:[id number] DOB: 28-Jan-1961 DOA: 05/17/2023 PCP: Tresa Garter, MD   Brief Narrative:  Randy [name] is a 62 y.o. male with medical history significant of morbid obesity, hypertension, hyperlipidemia, type 2 diabetes and hypothyroidism s/p partial thyroidectomy who presents with left lower extremity pain, patient noted to have profound blanching erythema concerning for cellulitis.  Patient reports recent anterior left leg [place] 10 days prior to admission.  Patient had not been on prior antibiotics, at intake patient's telemetry and EKG were consistent with A-fib which is a new diagnosis for the patient.  Patient admitted for further evaluation treatment of sepsis secondary to cellulitis with concurrent new onset A-fib with RVR.  Assessment & Plan:   Principal Problem:   Sepsis (HCC) Active Problems:   Paroxysmal atrial fibrillation with RVR (HCC)   Mixed hyperlipidemia   Hypothyroidism   OBESITY, MORBID   Essential hypertension   Type 2 diabetes mellitus (HCC)   Cellulitis   Hypokalemia  Sepsis (HCC) LLE cellulitis, POA -secondary to left lower extremity cellulitis from freshwater wound ([place]) -CT left tib-fib unremarkable for abscess/fluid collection -DVT study negative -Continue IV vancomycin and Zosyn - likely transition to Cephalexin 500 qid+Levaquin 750 QD; Consider adding doxy 100bid(unclear if vibrio exposure) -Lower extremity erythema and bullae appear to be stabilizing somewhat, no advancement from demarcation line placed 24 hours ago  Paroxysmal atrial fibrillation with RVR (HCC), new diagnosis - A-fib/RVR questionably provoked given above, rate much more appropriately controlled with supportive care - Goal rate less than 120 - still up to 150s today with exertion - Echocardiogram: EF 50-55%; bilateral atrial dilation,  mild MR, otherwise unremarkable - TSH within normal limits - ChadsVasc  of 2 (HTN/DM) places patient at 2.2% risk of VTE annually - continue anticoagulation and rate control at DC - follow up for repeat evaluation with PCP vs cardiology to evaluate for cardioversion  Mixed hyperlipidemia -Continue statin  Hypokalemia - Replete with oral supplement Type 2 diabetes mellitus (HCC) -Controlled. Last A1C of 5.3 Essential hypertension - Borderline soft with sepsis. Hold antihypertensives/monitor  OBESITY, MORBID - BMI of 43 - pt on Rybelsus outpatient  Hypothyroidism Continue levothyoxine  DVT prophylaxis: apixaban (ELIQUIS) tablet 5 mg  Code Status:   Code Status: Full Code Family Communication: None present  Status is: Inpatient Dispo: The patient is from: Home              Anticipated d/c is to: Home              Anticipated d/c date is: 24-48h              Patient currently NOT medically stable for discharge  Consultants:  None  Procedures:  None  Antimicrobials:  Zosyn/Vancomycin   Subjective: No acute issues/events overnight - pain well controlled no further swelling in the left lower extremity-denies fevers chills nausea vomiting diarrhea constipation.  Objective: Vitals:   05/19/23 1616 05/19/23 1912 05/19/23 2342 05/20/23 0428  BP: 95/68 119/80 99/79 104/68  Pulse: (!) 115 (!) 108 99 81  Resp:  18 20 20   Temp: 98.5 F (36.9 C) 98.1 F (36.7 C) 98 F (36.7 C) 98.3 F (36.8 C)  TempSrc: Oral Oral Oral   SpO2: 100% 100% 96% 96%  Weight:      Height:        Intake/Output Summary (Last 24 hours) at 05/20/2023 0735 Last data filed at 05/20/2023 2355 Gross  per 24 hour  Intake 543.77 ml  Output 500 ml  Net 43.77 ml   Filed Weights   05/17/23 1622  Weight: (!) 146.1 kg    Examination:  General:  Pleasantly resting in bed, No acute distress. HEENT:  Normocephalic atraumatic.  Sclerae nonicteric, noninjected.  Extraocular movements intact bilaterally. Neck:  Without mass or deformity.  Trachea is midline. Lungs:  Clear to auscultate  bilaterally without rhonchi, wheeze, or rales. Heart:  Regular rate and rhythm.  Without murmurs, rubs, or gallops. Abdomen:  Soft, nontender, nondistended.  Without guarding or rebound. Extremities: Circumferential erythema of the left lower extremity notably distal to the knee spreading distally to proximal side of ankle.  Left anterior tibia healing wound with black eschar surrounded by blanching erythema.  Posterior left lower extremity at the gastrocnemius head with bullae - intact without leakage or drainage. Skin: Erythema of the left lower extremity otherwise within normal limits  Data Reviewed: I have personally reviewed following labs and imaging studies  CBC: Recent Labs  Lab 05/17/23 1629 05/18/23 0110 05/19/23 0541 05/20/23 0528  WBC 6.4 4.5 3.7* 3.3*  NEUTROABS 5.0  --   --   --   HGB 12.9* 12.7* 11.7* 11.6*  HCT 38.4* 38.5* 35.8* 36.5*  MCV 90.4 92.5 93.0 93.4  PLT 181 159 153 174   Basic Metabolic Panel: Recent Labs  Lab 05/17/23 1629 05/18/23 0110 05/18/23 2305 05/19/23 0541 05/20/23 0528  NA 133* 133* 132*  --  135  K 3.4* 3.2* 3.7  --  3.8  CL 98 99 100  --  104  CO2 24 24 23   --  21*  GLUCOSE 96 120* 108*  --  89  BUN 23 19 17   --  14  CREATININE 1.31* 1.23 1.27* 1.18 1.00  CALCIUM 8.7* 8.2* 7.9*  --  8.3*  MG  --   --  2.1  --   --   PHOS  --   --  2.6  --   --    GFR: Estimated Creatinine Clearance: 112.3 mL/min (by C-G formula based on SCr of 1 mg/dL).  Liver Function Tests: Recent Labs  Lab 05/17/23 1629  AST 23  ALT 33  ALKPHOS 45  BILITOT 1.1  PROT 6.9  ALBUMIN 4.0   Coagulation Profile: Recent Labs  Lab 05/17/23 1629  INR 1.2   Thyroid Function Tests: Recent Labs    05/18/23 0110  TSH 0.393   Sepsis Labs: Recent Labs  Lab 05/17/23 1628 05/17/23 2111 05/18/23 0110  LATICACIDVEN 1.6 0.8 1.1    Recent Results (from the past 240 hour(s))  Culture, blood (Routine x 2)     Status: None (Preliminary result)   Collection  Time: 05/17/23  4:29 PM   Specimen: BLOOD LEFT HAND  Result Value Ref Range Status   Specimen Description   Final    BLOOD LEFT HAND Performed at [facility], [address], [place], [place] [address]    Special Requests   Final    BOTTLES DRAWN AEROBIC AND ANAEROBIC Blood Culture results may not be optimal due to an inadequate volume of blood received in culture bottles Performed at [facility], [address] [place], [place] [address]    Culture   Final    NO GROWTH 2 DAYS Performed at [facility], [address], [place], [place] [address]    Report Status PENDING  Incomplete  Culture, blood (Routine x 2)  Status: None (Preliminary result)   Collection Time: 05/17/23  5:11 PM   Specimen: BLOOD  Result Value Ref Range Status   Specimen Description   Final    BLOOD RIGHT ANTECUBITAL Performed at [facility], [address] [place], [place] [address]    Special Requests   Final    BOTTLES DRAWN AEROBIC AND ANAEROBIC Blood Culture adequate volume Performed at [facility], [address] [place], [place] [address]    Culture   Final    NO GROWTH 2 DAYS Performed at [facility], [address], [place], [place] [address]    Report Status PENDING  Incomplete     Radiology Studies: VAS [place] LOWER EXTREMITY VENOUS (DVT)  Result Date: 05/19/2023  Lower Venous DVT Study Patient Name:  Randy [name]  Date of Exam:   05/18/2023 Medical Rec #: [id number]      Accession #:    [id number] Date of Birth: 01/14/1961       Patient Gender: M Patient Age:   61 years Exam Location:  [facility] Procedure:      VAS [place] LOWER EXTREMITY VENOUS (DVT) Referring Phys: CHING TU --------------------------------------------------------------------------------  Indications: Swelling.  Risk Factors: None identified. Limitations: Body habitus and poor ultrasound/tissue interface. Comparison Study: No prior studies. Performing Technologist:  Chanda Busing RVT  Examination Guidelines: A complete evaluation includes B-mode imaging, spectral Doppler, color Doppler, and power Doppler as needed of all accessible portions of each vessel. Bilateral testing is considered an integral part of a complete examination. Limited examinations for reoccurring indications may be performed as noted. The reflux portion of the exam is performed with the patient in reverse Trendelenburg.  +-----+---------------+---------+-----------+----------+--------------+ RIGHTCompressibilityPhasicitySpontaneityPropertiesThrombus Aging +-----+---------------+---------+-----------+----------+--------------+ CFV  Full           Yes      Yes                                 +-----+---------------+---------+-----------+----------+--------------+   +---------+---------------+---------+-----------+----------+-------------------+ LEFT     CompressibilityPhasicitySpontaneityPropertiesThrombus Aging      +---------+---------------+---------+-----------+----------+-------------------+ CFV      Full           Yes      Yes                                      +---------+---------------+---------+-----------+----------+-------------------+ SFJ      Full                                                             +---------+---------------+---------+-----------+----------+-------------------+ FV Prox  Full                                                             +---------+---------------+---------+-----------+----------+-------------------+ FV Mid   Full                                                             +---------+---------------+---------+-----------+----------+-------------------+  FV DistalFull                                                             +---------+---------------+---------+-----------+----------+-------------------+ PFV      Full                                                              +---------+---------------+---------+-----------+----------+-------------------+ POP      Full           Yes      Yes                                      +---------+---------------+---------+-----------+----------+-------------------+ PTV      Full                                                             +---------+---------------+---------+-----------+----------+-------------------+ PERO                                                  Not well visualized +---------+---------------+---------+-----------+----------+-------------------+    Summary: RIGHT: - No evidence of common femoral vein obstruction.   LEFT: - There is no evidence of deep vein thrombosis in the lower extremity. However, portions of this examination were limited- see technologist comments above.  - No cystic structure found in the popliteal fossa.  *See table(s) above for measurements and observations. Electronically signed by Gerarda Fraction on 05/19/2023 at 8:40:20 AM.    Final    CT TIBIA FIBULA LEFT W CONTRAST  Result Date: 05/18/2023 CLINICAL DATA:  Foot swelling, diabetic, osteomyelitis suspected, xray done rule out cellulitis, possible abscess EXAM: CT OF THE LOWER LEFT EXTREMITY WITH CONTRAST TECHNIQUE: Multidetector CT imaging of the lower left extremity was performed according to the standard protocol following intravenous contrast administration. RADIATION DOSE REDUCTION: This exam was performed according to the departmental dose-optimization program which includes automated exposure control, adjustment of the mA and/or kV according to patient size and/or use of iterative reconstruction technique. CONTRAST:  OMNIPAQUE IOHEXOL 300 MG/ML  SOLN COMPARISON:  X-ray 05/17/2023 FINDINGS: Bones/Joint/Cartilage Left tibia and fibula intact. No fracture. No dislocation. 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 Mild degenerative changes at the ankle and hindfoot. There is some ossification in the  distal tibiofibular syndesmosis likely sequela of remote trauma. No focal erosion or aggressive periosteal elevation. No lytic or sclerotic bone lesion. Ligaments Suboptimally assessed by CT. Muscles and Tendons No acute musculotendinous abnormality by CT. Thickened patellar tendon. Soft tissues Circumferential subcutaneous edema of the lower leg, overall mild. No organized fluid collection. No soft tissue gas. IMPRESSION: 1. Circumferential subcutaneous edema of the lower leg. No organized fluid collection. No soft tissue gas. 2.  No CT evidence of osteomyelitis. 3. 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 4. Thickened patellar tendon, likely related to chronic tendinopathy. Electronically Signed   By: Duanne Guess D.O.   On: 05/18/2023 13:39    Scheduled Meds:  apixaban  5 mg Oral BID   vitamin C with rose hips  500 mg Oral Q1500   cholecalciferol  2,000 Units Oral QPM   cyanocobalamin  2,000 mcg Oral q morning   diltiazem  60 mg Oral Q12H   ibuprofen  400 mg Oral BID   influenza vac split trivalent PF  0.5 mL Intramuscular Tomorrow-1000   levothyroxine  250 mcg Oral QAC breakfast   pravastatin  40 mg Oral q1800   Semaglutide  14 mg Oral Daily   Continuous Infusions:  sodium chloride 10 mL/hr at 05/17/23 1838   sodium chloride Stopped (05/17/23 1725)   piperacillin-tazobactam (ZOSYN)  IV 3.375 g (05/20/23 0530)   vancomycin Stopped (05/19/23 2300)     LOS: 3 days   Time spent:  Azucena Fallen, DO [facility]  If 7PM-7AM, please contact night-coverage [email or web address]  05/20/2023, 7:35 AM

## 2023-05-20 NOTE — Plan of Care (Signed)
Problem: Education: Goal: Knowledge of General Education information will improve Description: Including pain rating scale, medication(s)/side effects and non-pharmacologic comfort measures Outcome: Progressing   Problem: Health Behavior/Discharge Planning: Goal: Ability to manage health-related needs will improve Outcome: Progressing   Problem: Clinical Measurements: Goal: Ability to maintain clinical measurements within normal limits will improve Outcome: Progressing Goal: Will remain free from infection Outcome: Progressing Goal: Diagnostic test results will improve Outcome: Progressing Goal: Respiratory complications will improve Outcome: Progressing Goal: Cardiovascular complication will be avoided Outcome: Progressing   Problem: Activity: Goal: Risk for activity intolerance will decrease Outcome: Progressing   Problem: Nutrition: Goal: Adequate nutrition will be maintained Outcome: Progressing   Problem: Coping: Goal: Level of anxiety will decrease Outcome: Progressing   Problem: Elimination: Goal: Will not experience complications related to bowel motility Outcome: Progressing Goal: Will not experience complications related to urinary retention Outcome: Progressing   Problem: Pain Managment: Goal: General experience of comfort will improve Outcome: Progressing   Problem: Safety: Goal: Ability to remain free from injury will improve Outcome: Progressing   Problem: Skin Integrity: Goal: Risk for impaired skin integrity will decrease Outcome: Progressing   Problem: Education: Goal: Knowledge of General Education information will improve Description: Including pain rating scale, medication(s)/side effects and non-pharmacologic comfort measures Outcome: Progressing   Problem: Health Behavior/Discharge Planning: Goal: Ability to manage health-related needs will improve Outcome: Progressing   Problem: Clinical Measurements: Goal: Ability to maintain  clinical measurements within normal limits will improve Outcome: Progressing Goal: Will remain free from infection Outcome: Progressing Goal: Diagnostic test results will improve Outcome: Progressing Goal: Respiratory complications will improve Outcome: Progressing Goal: Cardiovascular complication will be avoided Outcome: Progressing   Problem: Activity: Goal: Risk for activity intolerance will decrease Outcome: Progressing   Problem: Nutrition: Goal: Adequate nutrition will be maintained Outcome: Progressing   Problem: Coping: Goal: Level of anxiety will decrease Outcome: Progressing   Problem: Elimination: Goal: Will not experience complications related to bowel motility Outcome: Progressing Goal: Will not experience complications related to urinary retention Outcome: Progressing   Problem: Pain Managment: Goal: General experience of comfort will improve Outcome: Progressing   Problem: Safety: Goal: Ability to remain free from injury will improve Outcome: Progressing   Problem: Skin Integrity: Goal: Risk for impaired skin integrity will decrease Outcome: Progressing   Problem: Education: Goal: Knowledge of General Education information will improve Description: Including pain rating scale, medication(s)/side effects and non-pharmacologic comfort measures Outcome: Progressing   Problem: Health Behavior/Discharge Planning: Goal: Ability to manage health-related needs will improve Outcome: Progressing   Problem: Clinical Measurements: Goal: Ability to maintain clinical measurements within normal limits will improve Outcome: Progressing Goal: Will remain free from infection Outcome: Progressing Goal: Diagnostic test results will improve Outcome: Progressing Goal: Respiratory complications will improve Outcome: Progressing Goal: Cardiovascular complication will be avoided Outcome: Progressing   Problem: Activity: Goal: Risk for activity intolerance will  decrease Outcome: Progressing   Problem: Nutrition: Goal: Adequate nutrition will be maintained Outcome: Progressing   Problem: Coping: Goal: Level of anxiety will decrease Outcome: Progressing   Problem: Elimination: Goal: Will not experience complications related to bowel motility Outcome: Progressing Goal: Will not experience complications related to urinary retention Outcome: Progressing   Problem: Pain Managment: Goal: General experience of comfort will improve Outcome: Progressing   Problem: Safety: Goal: Ability to remain free from injury will improve Outcome: Progressing   Problem: Skin Integrity: Goal: Risk for impaired skin integrity will decrease Outcome: Progressing   Problem: Education: Goal: Knowledge of General Education  information will improve Description: Including pain rating scale, medication(s)/side effects and non-pharmacologic comfort measures Outcome: Progressing   Problem: Health Behavior/Discharge Planning: Goal: Ability to manage health-related needs will improve Outcome: Progressing   Problem: Clinical Measurements: Goal: Ability to maintain clinical measurements within normal limits will improve Outcome: Progressing Goal: Will remain free from infection Outcome: Progressing Goal: Diagnostic test results will improve Outcome: Progressing Goal: Respiratory complications will improve Outcome: Progressing Goal: Cardiovascular complication will be avoided Outcome: Progressing   Problem: Activity: Goal: Risk for activity intolerance will decrease Outcome: Progressing   Problem: Nutrition: Goal: Adequate nutrition will be maintained Outcome: Progressing   Problem: Coping: Goal: Level of anxiety will decrease Outcome: Progressing   Problem: Elimination: Goal: Will not experience complications related to bowel motility Outcome: Progressing Goal: Will not experience complications related to urinary retention Outcome: Progressing    Problem: Pain Managment: Goal: General experience of comfort will improve Outcome: Progressing   Problem: Safety: Goal: Ability to remain free from injury will improve Outcome: Progressing   Problem: Skin Integrity: Goal: Risk for impaired skin integrity will decrease Outcome: Progressing   Problem: Education: Goal: Knowledge of General Education information will improve Description: Including pain rating scale, medication(s)/side effects and non-pharmacologic comfort measures Outcome: Progressing   Problem: Health Behavior/Discharge Planning: Goal: Ability to manage health-related needs will improve Outcome: Progressing   Problem: Clinical Measurements: Goal: Ability to maintain clinical measurements within normal limits will improve Outcome: Progressing Goal: Will remain free from infection Outcome: Progressing Goal: Diagnostic test results will improve Outcome: Progressing Goal: Respiratory complications will improve Outcome: Progressing Goal: Cardiovascular complication will be avoided Outcome: Progressing   Problem: Activity: Goal: Risk for activity intolerance will decrease Outcome: Progressing   Problem: Nutrition: Goal: Adequate nutrition will be maintained Outcome: Progressing   Problem: Coping: Goal: Level of anxiety will decrease Outcome: Progressing   Problem: Elimination: Goal: Will not experience complications related to bowel motility Outcome: Progressing Goal: Will not experience complications related to urinary retention Outcome: Progressing   Problem: Pain Managment: Goal: General experience of comfort will improve Outcome: Progressing   Problem: Safety: Goal: Ability to remain free from injury will improve Outcome: Progressing   Problem: Skin Integrity: Goal: Risk for impaired skin integrity will decrease Outcome: Progressing   Problem: Education: Goal: Knowledge of General Education information will improve Description: Including  pain rating scale, medication(s)/side effects and non-pharmacologic comfort measures Outcome: Progressing   Problem: Health Behavior/Discharge Planning: Goal: Ability to manage health-related needs will improve Outcome: Progressing   Problem: Clinical Measurements: Goal: Ability to maintain clinical measurements within normal limits will improve Outcome: Progressing Goal: Will remain free from infection Outcome: Progressing Goal: Diagnostic test results will improve Outcome: Progressing Goal: Respiratory complications will improve Outcome: Progressing Goal: Cardiovascular complication will be avoided Outcome: Progressing   Problem: Activity: Goal: Risk for activity intolerance will decrease Outcome: Progressing   Problem: Nutrition: Goal: Adequate nutrition will be maintained Outcome: Progressing   Problem: Coping: Goal: Level of anxiety will decrease Outcome: Progressing   Problem: Elimination: Goal: Will not experience complications related to bowel motility Outcome: Progressing Goal: Will not experience complications related to urinary retention Outcome: Progressing   Problem: Pain Managment: Goal: General experience of comfort will improve Outcome: Progressing   Problem: Safety: Goal: Ability to remain free from injury will improve Outcome: Progressing   Problem: Skin Integrity: Goal: Risk for impaired skin integrity will decrease Outcome: Progressing   Problem: Education: Goal: Knowledge of General Education information will improve Description: Including pain rating  scale, medication(s)/side effects and non-pharmacologic comfort measures Outcome: Progressing   Problem: Education: Goal: Knowledge of General Education information will improve Description: Including pain rating scale, medication(s)/side effects and non-pharmacologic comfort measures Outcome: Progressing   Problem: Education: Goal: Knowledge of General Education information will  improve Description: Including pain rating scale, medication(s)/side effects and non-pharmacologic comfort measures Outcome: Progressing   Problem: Health Behavior/Discharge Planning: Goal: Ability to manage health-related needs will improve Outcome: Progressing   Problem: Clinical Measurements: Goal: Ability to maintain clinical measurements within normal limits will improve Outcome: Progressing Goal: Will remain free from infection Outcome: Progressing Goal: Diagnostic test results will improve Outcome: Progressing Goal: Respiratory complications will improve Outcome: Progressing Goal: Cardiovascular complication will be avoided Outcome: Progressing   Problem: Activity: Goal: Risk for activity intolerance will decrease Outcome: Progressing   Problem: Nutrition: Goal: Adequate nutrition will be maintained Outcome: Progressing   Problem: Coping: Goal: Level of anxiety will decrease Outcome: Progressing   Problem: Elimination: Goal: Will not experience complications related to bowel motility Outcome: Progressing Goal: Will not experience complications related to urinary retention Outcome: Progressing   Problem: Pain Managment: Goal: General experience of comfort will improve Outcome: Progressing   Problem: Safety: Goal: Ability to remain free from injury will improve Outcome: Progressing   Problem: Skin Integrity: Goal: Risk for impaired skin integrity will decrease Outcome: Progressing   Problem: Education: Goal: Knowledge of General Education information will improve Description: Including pain rating scale, medication(s)/side effects and non-pharmacologic comfort measures 05/20/2023 0318 by Benny Lennert, RN Outcome: Progressing 05/20/2023 0317 by Benny Lennert, RN Outcome: Progressing   Problem: Health Behavior/Discharge Planning: Goal: Ability to manage health-related needs will improve 05/20/2023 0318 by Benny Lennert, RN Outcome: Progressing 05/20/2023 0317 by  Benny Lennert, RN Outcome: Progressing   Problem: Clinical Measurements: Goal: Ability to maintain clinical measurements within normal limits will improve 05/20/2023 0318 by Benny Lennert, RN Outcome: Progressing 05/20/2023 0317 by Benny Lennert, RN Outcome: Progressing Goal: Will remain free from infection 05/20/2023 0318 by Benny Lennert, RN Outcome: Progressing 05/20/2023 0317 by Benny Lennert, RN Outcome: Progressing Goal: Diagnostic test results will improve 05/20/2023 0318 by Benny Lennert, RN Outcome: Progressing 05/20/2023 0317 by Benny Lennert, RN Outcome: Progressing Goal: Respiratory complications will improve 05/20/2023 0318 by Benny Lennert, RN Outcome: Progressing 05/20/2023 0317 by Benny Lennert, RN Outcome: Progressing Goal: Cardiovascular complication will be avoided 05/20/2023 0318 by Benny Lennert, RN Outcome: Progressing 05/20/2023 0317 by Benny Lennert, RN Outcome: Progressing   Problem: Activity: Goal: Risk for activity intolerance will decrease 05/20/2023 0318 by Benny Lennert, RN Outcome: Progressing 05/20/2023 0317 by Benny Lennert, RN Outcome: Progressing   Problem: Nutrition: Goal: Adequate nutrition will be maintained 05/20/2023 0318 by Benny Lennert, RN Outcome: Progressing 05/20/2023 0317 by Benny Lennert, RN Outcome: Progressing   Problem: Coping: Goal: Level of anxiety will decrease 05/20/2023 0318 by Benny Lennert, RN Outcome: Progressing 05/20/2023 0317 by Benny Lennert, RN Outcome: Progressing   Problem: Elimination: Goal: Will not experience complications related to bowel motility 05/20/2023 0318 by Benny Lennert, RN Outcome: Progressing 05/20/2023 0317 by Benny Lennert, RN Outcome: Progressing Goal: Will not experience complications related to urinary retention 05/20/2023 0318 by Benny Lennert, RN Outcome: Progressing 05/20/2023 0317 by Benny Lennert, RN Outcome: Progressing   Problem: Pain Managment: Goal: General experience of comfort will improve 05/20/2023 0318 by Benny Lennert, RN Outcome: Progressing  05/20/2023 0317 by Benny Lennert, RN Outcome: Progressing   Problem: Safety: Goal: Ability to remain free from injury will improve 05/20/2023 0318 by Benny Lennert, RN Outcome: Progressing 05/20/2023 0317 by Benny Lennert, RN Outcome: Progressing   Problem: Skin Integrity: Goal: Risk for impaired skin integrity will decrease 05/20/2023 0318 by Benny Lennert, RN Outcome: Progressing 05/20/2023 0317 by Benny Lennert, RN Outcome: Progressing

## 2023-05-20 NOTE — Progress Notes (Signed)
Pharmacy Antibiotic Note  Randy [name] is a 62 y.o. male admitted on 05/17/2023 with cellulitis.  Pharmacy has been consulted for vancomycin & zosyn dosing. 05/20/2023 Day # 4 vancomycin/zosyn for cellulitis WBC 3.3, SCr 1. AF  Plan: Continue Zosyn 3.375g IV q8h (4 hour infusion). Continue Vancomycin 2000 mg IV q24 for eAUC 480.5 Daily SCr while on vancomycin/zosyn combination Anticipate transition to oral abx soon  Height: 6' (182.9 cm) Weight: (!) 146.1 kg (322 lb 3.2 oz) IBW/kg (Calculated) : 77.6  Temp (24hrs), Avg:98.3 F (36.8 C), Min:98 F (36.7 C), Max:98.6 F (37 C)  Recent Labs  Lab 05/17/23 1628 05/17/23 1629 05/17/23 2111 05/18/23 0110 05/18/23 2305 05/19/23 0541 05/20/23 0528  WBC  --  6.4  --  4.5  --  3.7* 3.3*  CREATININE  --  1.31*  --  1.23 1.27* 1.18 1.00  LATICACIDVEN 1.6  --  0.8 1.1  --   --   --     Estimated Creatinine Clearance: 112.3 mL/min (by C-G formula based on SCr of 1 mg/dL).    Allergies  Allergen Reactions   Penicillins Other (See Comments)    Family history of allergic reaction    Watermelon [Citrullus Vulgaris] Itching and Other (See Comments)    Makes throat itch     Antimicrobials this admission: 9/4 Vanc>> 9/4 zosyn>> 9/4 clinda x 1  Dose adjustments this admission:  Microbiology results: 9/4 BCx2: ngtd  Thank you for allowing pharmacy to be a part of this patient's care.   Herby Abraham, Pharm.D Use secure chat for questions 05/20/2023 1:03 PM

## 2023-05-20 NOTE — Plan of Care (Signed)
  Problem: Clinical Measurements: Goal: Will remain free from infection Outcome: Progressing Goal: Cardiovascular complication will be avoided Outcome: Progressing   Problem: Nutrition: Goal: Adequate nutrition will be maintained Outcome: Progressing   Problem: Elimination: Goal: Will not experience complications related to bowel motility Outcome: Progressing   Problem: Safety: Goal: Ability to remain free from injury will improve Outcome: Progressing   Problem: Skin Integrity: Goal: Risk for impaired skin integrity will decrease Outcome: Progressing

## 2023-05-20 NOTE — Progress Notes (Signed)
Mobility Specialist - Progress Note   05/20/23 1025  Mobility  Activity Ambulated with assistance in hallway  Level of Assistance Standby assist, set-up cues, supervision of patient - no hands on  Assistive Device None  Distance Ambulated (ft) 300 ft  Range of Motion/Exercises Active  Activity Response Tolerated well  Mobility Referral Yes  $Mobility charge 1 Mobility  Mobility Specialist Start Time (ACUTE ONLY) 1000  Mobility Specialist Stop Time (ACUTE ONLY) 1020  Mobility Specialist Time Calculation (min) (ACUTE ONLY) 20 min   Pt received in bed and agreed to mobility. No issues throughout session. Did one flight of stairs with no c/o pain nor discomfort, HR spiked to 183 but returned under a minute to 150s.  Back in chair with all needs met.  Marilynne Halsted Mobility Specialist

## 2023-05-21 DIAGNOSIS — L03116 Cellulitis of left lower limb: Secondary | ICD-10-CM | POA: Diagnosis not present

## 2023-05-21 DIAGNOSIS — A419 Sepsis, unspecified organism: Secondary | ICD-10-CM | POA: Diagnosis not present

## 2023-05-21 DIAGNOSIS — E782 Mixed hyperlipidemia: Secondary | ICD-10-CM | POA: Diagnosis not present

## 2023-05-21 DIAGNOSIS — I480 Paroxysmal atrial fibrillation: Secondary | ICD-10-CM | POA: Diagnosis not present

## 2023-05-21 LAB — BASIC METABOLIC PANEL
Anion gap: 10 (ref 5–15)
BUN: 17 mg/dL (ref 8–23)
CO2: 22 mmol/L (ref 22–32)
Calcium: 8.4 mg/dL — ABNORMAL LOW (ref 8.9–10.3)
Chloride: 103 mmol/L (ref 98–111)
Creatinine, Ser: 1.05 mg/dL (ref 0.61–1.24)
GFR, Estimated: >60 mL/min (ref >60)
Glucose, Bld: 94 mg/dL (ref 70–99)
Potassium: 3.9 mmol/L (ref 3.5–5.1)
Sodium: 135 mmol/L (ref 135–145)

## 2023-05-21 LAB — CBC
HCT: 35.7 % — ABNORMAL LOW (ref 39.0–52.0)
Hemoglobin: 11.8 g/dL — ABNORMAL LOW (ref 13.0–17.0)
MCH: 30.3 pg (ref 26.0–34.0)
MCHC: 33.1 g/dL (ref 30.0–36.0)
MCV: 91.8 fL (ref 80.0–100.0)
Platelets: 203 10*3/uL (ref 150–400)
RBC: 3.89 MIL/uL — ABNORMAL LOW (ref 4.22–5.81)
RDW: 13.2 % (ref 11.5–15.5)
WBC: 4.1 10*3/uL (ref 4.0–10.5)
nRBC: 0 % (ref 0.0–0.2)

## 2023-05-21 MED ORDER — DILTIAZEM HCL ER 60 MG PO CP12: 60.0000 mg | ORAL_CAPSULE | Freq: Two times a day (BID) | ORAL | 0 refills | Status: DC

## 2023-05-21 MED ORDER — APIXABAN 5 MG PO TABS: 5.0000 mg | ORAL_TABLET | Freq: Two times a day (BID) | ORAL | 1 refills | Status: DC

## 2023-05-21 MED ORDER — CEPHALEXIN 500 MG PO CAPS: 500.0000 mg | ORAL_CAPSULE | Freq: Four times a day (QID) | ORAL | 0 refills | Status: DC

## 2023-05-21 MED ORDER — LEVOFLOXACIN 750 MG PO TABS: 750.0000 mg | ORAL_TABLET | Freq: Every day | ORAL | 0 refills | Status: DC

## 2023-05-21 NOTE — Progress Notes (Signed)
Mobility Specialist - Progress Note   05/21/23 1000  Mobility  Activity Ambulated with assistance in hallway  Level of Assistance Modified independent, requires aide device or extra time  Assistive Device None  Distance Ambulated (ft) 500 ft  Range of Motion/Exercises Active  Activity Response Tolerated well  Mobility Referral Yes  $Mobility charge 1 Mobility  Mobility Specialist Start Time (ACUTE ONLY) 1000  Mobility Specialist Stop Time (ACUTE ONLY) 1010  Mobility Specialist Time Calculation (min) (ACUTE ONLY) 10 min   Pt received in bed and agreed to mobility. Had no issues, one standing rest break. Returned to chair with all needs met.  Marilynne Halsted Mobility Specialist

## 2023-05-21 NOTE — Discharge Summary (Signed)
Physician Discharge Summary  Randy [name]:[id number] DOB: 1961/02/25 DOA: 05/17/2023  PCP: Tresa Garter, MD  Admit date: 05/17/2023 Discharge date: 05/21/2023  Admitted From: Home Disposition: Home  Recommendations for Outpatient Follow-up:  Follow up with PCP in 1-2 weeks  Home Health: None Equipment/Devices: None  Discharge Condition: Stable CODE STATUS: Full Diet recommendation: Low-salt low-fat low-carb diet  Brief/Interim Summary: Randy [name] is a 62 y.o. male with medical history significant of morbid obesity, hypertension, hyperlipidemia, type 2 diabetes and hypothyroidism s/p partial thyroidectomy who presents with left lower extremity pain, patient noted to have profound blanching erythema concerning for cellulitis.  Patient reports recent anterior left leg wound [place] 10 days prior to admission.  Patient had not been on prior antibiotics, at intake patient's telemetry and EKG were consistent with A-fib which is a new diagnosis for the patient.  Patient admitted for further evaluation treatment of sepsis secondary to cellulitis with concurrent new onset A-fib with RVR.  Patient admitted as above with profound left lower extremity cellulitis pain needing criteria for sepsis.  Patient's pain improved drastically over the initial 24 hours however patient's erythema and posterior lower extremity edema continued to worsen.  Ultimately patient symptoms improved as did his clinical exam.  At this point we will transition patient over to cephalexin and Levaquin per guidelines, doxycycline was held given no clear exposure to vibrio.  Incidentally noted at intake patient had A-fib with RVR likely provoked secondary to above sepsis and pain, patient's heart rate remained somewhat elevated during hospitalization, improving with initiation of diltiazem.  At this time patient's symptoms are well-controlled, heart rate well-controlled even with exertion.   Stable for discharge.  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  Discharge Diagnoses:  Principal Problem:   Sepsis (HCC) Active Problems:   Paroxysmal atrial fibrillation with RVR (HCC)   Mixed hyperlipidemia   Hypothyroidism   OBESITY, MORBID   Essential hypertension   Type 2 diabetes mellitus (HCC)   Cellulitis   Hypokalemia    Discharge Instructions   Allergies as of 05/21/2023       Reactions   Penicillins Other (See Comments)   Family history of allergic reaction    Watermelon [citrullus Vulgaris] Itching, Other (See Comments)   Makes throat itch         Medication List     STOP taking these medications    amLODipine 5 MG tablet Commonly known as: NORVASC   aspirin EC 81 MG tablet   baclofen 20 MG tablet Commonly known as: LIORESAL   meclizine 25 MG tablet Commonly known as: ANTIVERT       TAKE these medications    apixaban 5 MG Tabs tablet Commonly known as: ELIQUIS Take 1 tablet (5 mg total) by mouth 2 (two) times daily.   B-12 2000 MCG Tabs Take 2,000 mcg by mouth every morning.   benazepril 20 MG tablet Commonly known as: LOTENSIN Take 1 tablet (20 mg total) by mouth daily.   cephALEXin 500 MG capsule Commonly known as: KEFLEX Take 1 capsule (500 mg total) by mouth 4 (four) times daily for 7 days.   CoQ10 100 MG Caps Take 100 mg by mouth every morning.   diltiazem 60 MG 12 hr capsule Commonly known as: CARDIZEM SR Take 1  capsule (60 mg total) by mouth every 12 (twelve) hours.   ibuprofen 200 MG tablet Commonly known as: ADVIL Take 400 mg by mouth 2 (two) times daily.   levofloxacin 750 MG tablet Commonly known as: Levaquin Take 1 tablet (750 mg total) by mouth daily for 7 days.   levothyroxine 125 MCG tablet Commonly known as: SYNTHROID Take 2  tablets (250 mcg total) by mouth daily before breakfast. TAKE 2 TABLETS BY MOUTH BEFORE BREAKFAST . What changed: additional instructions   lovastatin 40 MG tablet Commonly known as: MEVACOR Take 1 tablet (40 mg total) by mouth at bedtime.   multivitamin tablet Take 1 tablet by mouth daily with breakfast.   Rybelsus 14 MG Tabs Generic drug: Semaglutide Take 1 tablet (14 mg total) by mouth daily. What changed:  when to take this additional instructions   triamterene-hydrochlorothiazide 37.5-25 MG tablet Commonly known as: MAXZIDE-25 Take 1 tablet by mouth daily.   TURMERIC PO Take 1 capsule by mouth every evening.   vitamin C with rose hips 1000 MG tablet Take 5,000 mg by mouth daily in the afternoon.   Vitamin D3 50 MCG (2000 UT) Tabs Take 2,000 Units by mouth every evening.        Allergies  Allergen Reactions   Penicillins Other (See Comments)    Family history of allergic reaction    Watermelon [Citrullus Vulgaris] Itching and Other (See Comments)    Makes throat itch     Consultations: None   Procedures/Studies: ECHOCARDIOGRAM COMPLETE  Result Date: 05/20/2023    ECHOCARDIOGRAM REPORT   Patient Name:   Randy [name] Date of Exam: 05/20/2023 Medical Rec #:  [id number]     Height:       72.0 in Accession #:    [id number]    Weight:       322.2 lb Date of Birth:  09-11-1961      BSA:          2.609 m Patient Age:    62 years      BP:           102/58 mmHg Patient Gender: M             HR:           106 bpm. Exam Location:  Inpatient Procedure: 2D Echo, Color Doppler, Cardiac Doppler and Intracardiac            Opacification Agent Indications:    Atrial Fibrillation  History:        Patient has no prior history of Echocardiogram examinations.                 Arrythmias:Atrial Fibrillation; Risk Factors:Hypertension,                 Diabetes and Dyslipidemia.  Sonographer:    Milbert Coulter Referring Phys: [id number] CHING T TU  Sonographer Comments: Image acquisition  challenging due to patient body habitus. IMPRESSIONS  1. Left ventricular ejection fraction, by estimation, is 50 to 55%. The left ventricle has low normal function. The left ventricle has no regional wall motion abnormalities. There is mild asymmetric left ventricular hypertrophy of the basal and septal segments. Left ventricular diastolic parameters are indeterminate.  2. Right ventricular systolic function is normal. The right ventricular size is normal.  3. Left atrial size was severely dilated.  4. Right atrial size was moderately dilated.  5. The mitral valve is abnormal. Mild mitral valve regurgitation. No evidence of mitral stenosis.  6. The aortic valve is tricuspid. There is mild calcification of the aortic valve. There is mild thickening of the aortic valve. Aortic valve regurgitation is not visualized. Aortic valve sclerosis is present, with no evidence of aortic valve stenosis.  7. The inferior vena cava is normal in size with greater than 50% respiratory variability, suggesting right atrial pressure of 3 mmHg. FINDINGS  Left Ventricle: Left ventricular ejection fraction, by estimation, is 50 to 55%. The left ventricle has low normal function. The left ventricle has no regional wall motion abnormalities. Definity contrast agent was given IV to delineate the left ventricular endocardial borders. The left ventricular internal cavity size was normal in size. There is mild asymmetric left ventricular hypertrophy of the basal and septal segments. Left ventricular diastolic parameters are indeterminate. Right Ventricle: The right ventricular size is normal. No increase in right ventricular wall thickness. Right ventricular systolic function is normal. Left Atrium: Left atrial size was severely dilated. Right Atrium: Right atrial size was moderately dilated. Pericardium: There is no evidence of pericardial effusion. Mitral Valve: The mitral valve is abnormal. There is mild thickening of the mitral valve  leaflet(s). There is mild calcification of the mitral valve leaflet(s). Mild mitral valve regurgitation. No evidence of mitral valve stenosis. Tricuspid Valve: The tricuspid valve is normal in structure. Tricuspid valve regurgitation is mild . No evidence of tricuspid stenosis. Aortic Valve: The aortic valve is tricuspid. There is mild calcification of the aortic valve. There is mild thickening of the aortic valve. Aortic valve regurgitation is not visualized. Aortic valve sclerosis is present, with no evidence of aortic valve stenosis. Aortic valve mean gradient measures 4.0 mmHg. Aortic valve peak gradient measures 7.6 mmHg. Aortic valve area, by VTI measures 3.32 cm. Pulmonic Valve: The pulmonic valve was normal in structure. Pulmonic valve regurgitation is not visualized. No evidence of pulmonic stenosis. Aorta: The aortic root is normal in size and structure. Venous: The inferior vena cava is normal in size with greater than 50% respiratory variability, suggesting right atrial pressure of 3 mmHg. IAS/Shunts: No atrial level shunt detected by color flow Doppler.  LEFT VENTRICLE PLAX 2D LVIDd:         5.10 cm   Diastology LVIDs:         3.80 cm   LV e' medial:    10.40 cm/s LV PW:         1.30 cm   LV E/e' medial:  11.9 LV IVS:        1.40 cm   LV e' lateral:   14.60 cm/s LVOT diam:     2.30 cm   LV E/e' lateral: 8.5 LV SV:         81 LV SV Index:   31 LVOT Area:     4.15 cm  RIGHT VENTRICLE RV S prime:     11.20 cm/s TAPSE (M-mode): 1.6 cm LEFT ATRIUM              Index        RIGHT ATRIUM           Index LA diam:        5.80 cm  2.22 cm/m   RA Area:     25.50 cm LA Vol (A2C):   172.0 ml 65.92 ml/m  RA Volume:   83.30 ml  31.93 ml/m LA Vol (A4C):   139.0 ml 53.27 ml/m LA Biplane Vol: 154.0 ml 59.02 ml/m  AORTIC VALVE AV Area (Vmax):    3.22  cm AV Area (Vmean):   3.13 cm AV Area (VTI):     3.32 cm AV Vmax:           138.00 cm/s AV Vmean:          101.000 cm/s AV VTI:            0.244 m AV Peak Grad:       7.6 mmHg AV Mean Grad:      4.0 mmHg LVOT Vmax:         107.00 cm/s LVOT Vmean:        76.200 cm/s LVOT VTI:          0.195 m LVOT/AV VTI ratio: 0.80  AORTA Ao Root diam: 3.30 cm Ao Asc diam:  3.30 cm MITRAL VALVE                TRICUSPID VALVE MV Area (PHT): 4.06 cm     TR Peak grad:   25.0 mmHg MV Decel Time: 187 msec     TR Vmax:        250.00 cm/s MV E velocity: 124.00 cm/s                             SHUNTS                             Systemic VTI:  0.20 m                             Systemic Diam: 2.30 cm Charlton Haws MD Electronically signed by Charlton Haws MD Signature Date/Time: 05/20/2023/10:54:27 AM    Final    VAS [place] LOWER EXTREMITY VENOUS (DVT)  Result Date: 05/19/2023  Lower Venous DVT Study Patient Name:  ZAKARY GRAYS  Date of Exam:   05/18/2023 Medical Rec #: [id number]      Accession #:    [id number] Date of Birth: Jan 24, 1961       Patient Gender: M Patient Age:   76 years Exam Location:  [facility] Procedure:      VAS [place] LOWER EXTREMITY VENOUS (DVT) Referring Phys: CHING TU --------------------------------------------------------------------------------  Indications: Swelling.  Risk Factors: None identified. Limitations: Body habitus and poor ultrasound/tissue interface. Comparison Study: No prior studies. Performing Technologist: Chanda Busing RVT  Examination Guidelines: A complete evaluation includes B-mode imaging, spectral Doppler, color Doppler, and power Doppler as needed of all accessible portions of each vessel. Bilateral testing is considered an integral part of a complete examination. Limited examinations for reoccurring indications may be performed as noted. The reflux portion of the exam is performed with the patient in reverse Trendelenburg.  +-----+---------------+---------+-----------+----------+--------------+ RIGHTCompressibilityPhasicitySpontaneityPropertiesThrombus Aging +-----+---------------+---------+-----------+----------+--------------+ CFV  Full            Yes      Yes                                 +-----+---------------+---------+-----------+----------+--------------+   +---------+---------------+---------+-----------+----------+-------------------+ LEFT     CompressibilityPhasicitySpontaneityPropertiesThrombus Aging      +---------+---------------+---------+-----------+----------+-------------------+ CFV      Full           Yes      Yes                                      +---------+---------------+---------+-----------+----------+-------------------+  SFJ      Full                                                             +---------+---------------+---------+-----------+----------+-------------------+ FV Prox  Full                                                             +---------+---------------+---------+-----------+----------+-------------------+ FV Mid   Full                                                             +---------+---------------+---------+-----------+----------+-------------------+ FV DistalFull                                                             +---------+---------------+---------+-----------+----------+-------------------+ PFV      Full                                                             +---------+---------------+---------+-----------+----------+-------------------+ POP      Full           Yes      Yes                                      +---------+---------------+---------+-----------+----------+-------------------+ PTV      Full                                                             +---------+---------------+---------+-----------+----------+-------------------+ PERO                                                  Not well visualized +---------+---------------+---------+-----------+----------+-------------------+    Summary: RIGHT: - No evidence of common femoral vein obstruction.   LEFT: - There is no evidence of deep vein  thrombosis in the lower extremity. However, portions of this examination were limited- see technologist comments above.  - No cystic structure found in the popliteal fossa.  *See table(s) above for measurements and observations. Electronically signed by Gerarda Fraction on 05/19/2023 at 8:40:20 AM.    Final    CT TIBIA FIBULA LEFT W CONTRAST  Result Date: 05/18/2023  CLINICAL DATA:  Foot swelling, diabetic, osteomyelitis suspected, xray done rule out cellulitis, possible abscess EXAM: CT OF THE LOWER LEFT EXTREMITY WITH CONTRAST TECHNIQUE: Multidetector CT imaging of the lower left extremity was performed according to the standard protocol following intravenous contrast administration. RADIATION DOSE REDUCTION: This exam was performed according to the departmental dose-optimization program which includes automated exposure control, adjustment of the mA and/or kV according to patient size and/or use of iterative reconstruction technique. CONTRAST:  OMNIPAQUE IOHEXOL 300 MG/ML  SOLN COMPARISON:  X-ray 05/17/2023 FINDINGS: Bones/Joint/Cartilage Left tibia and fibula intact. No fracture. No dislocation. 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 Mild degenerative changes at the ankle and hindfoot. There is some ossification in the distal tibiofibular syndesmosis likely sequela of remote trauma. No focal erosion or aggressive periosteal elevation. No lytic or sclerotic bone lesion. Ligaments Suboptimally assessed by CT. Muscles and Tendons No acute musculotendinous abnormality by CT. Thickened patellar tendon. Soft tissues Circumferential subcutaneous edema of the lower leg, overall mild. No organized fluid collection. No soft tissue gas. IMPRESSION: 1. Circumferential subcutaneous edema of the lower leg. No organized fluid collection. No soft tissue gas. 2. No CT evidence of osteomyelitis. 3. 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 4. Thickened patellar tendon, likely related to  chronic tendinopathy. Electronically Signed   By: Duanne Guess D.O.   On: 05/18/2023 13:39   DG Tibia/Fibula Left  Result Date: 05/17/2023 CLINICAL DATA:  Infection to the left lower leg with dull pain and redness. Cellulitis. Query air. Query sepsis. EXAM: LEFT TIBIA AND FIBULA - 2 VIEW COMPARISON:  Left knee 05/07/2016 FINDINGS: Mild degenerative changes in the left knee and left ankle. No acute fracture or dislocation is demonstrated in the tibia or fibula. No focal bone lesion or bone destruction. No cortical erosion or sclerosis to suggest evidence of osteomyelitis. Old ununited ossicles anterior to the knee may represent loose bodies or synovial osteochondromatosis. Amorphous soft tissue calcifications posterior to the proximal tibia may represent vascular or dystrophic calcification. No radiopaque soft tissue foreign bodies. No soft tissue gas. IMPRESSION: 1. No acute bony abnormalities. 2. Degenerative changes in the left knee and ankle. 3. No radiopaque soft tissue foreign bodies or soft tissue gas demonstrated. Electronically Signed   By: Burman Nieves M.D.   On: 05/17/2023 18:22   DG Chest Port 1 View  Result Date: 05/17/2023 CLINICAL DATA:  Infection on left lower leg. Dull pain and redness. Sepsis. EXAM: PORTABLE CHEST 1 VIEW COMPARISON:  01/28/2005 FINDINGS: Cardiomegaly. Aortic atherosclerotic calcification. No focal consolidation, pleural effusion, or pneumothorax. No displaced rib fractures. IMPRESSION: No active disease. Electronically Signed   By: Minerva Fester M.D.   On: 05/17/2023 18:21     Subjective: No acute issues or events overnight, pain well-controlled, erythema appears to be retracting from prior outline   Discharge Exam: Vitals:   05/20/23 1954 05/21/23 0018  BP: (!) 150/88 104/62  Pulse: (!) 122 (!) 105  Resp: 20 20  Temp: 98.2 F (36.8 C) 98.6 F (37 C)  SpO2: 99% 97%   Vitals:   05/20/23 1427 05/20/23 1622 05/20/23 1954 05/21/23 0018  BP: 126/72 (!)  125/91 (!) 150/88 104/62  Pulse: (!) 113 94 (!) 122 (!) 105  Resp: 18 20 20 20   Temp: 97.9 F (36.6 C) 98.3 F (36.8 C) 98.2 F (36.8 C) 98.6 F (37 C)  TempSrc: Oral  Oral Oral  SpO2: 100% 98% 99% 97%  Weight:      Height:  General:  Pleasantly resting in bed, No acute distress. HEENT:  Normocephalic atraumatic.  Sclerae nonicteric, noninjected.  Extraocular movements intact bilaterally. Neck:  Without mass or deformity.  Trachea is midline. Lungs:  Clear to auscultate bilaterally without rhonchi, wheeze, or rales. Heart:  Regular rate and rhythm.  Without murmurs, rubs, or gallops. Abdomen:  Soft, nontender, nondistended.  Without guarding or rebound. Extremities: Circumferential erythema of the left lower extremity notably distal to the knee spreading distally to proximal side of ankle -moderate retraction from prior outline.  Left anterior tibia healing wound with black eschar surrounded by blanching erythema.  Posterior left lower extremity at the gastrocnemius head with ruptured bullae -bandage clean dry intact Skin: Erythema of the left lower extremity otherwise within normal limits   The results of significant diagnostics from this hospitalization (including imaging, microbiology, ancillary and laboratory) are listed below for reference.     Microbiology: Recent Results (from the past 240 hour(s))  Culture, blood (Routine x 2)     Status: None (Preliminary result)   Collection Time: 05/17/23  4:29 PM   Specimen: BLOOD LEFT HAND  Result Value Ref Range Status   Specimen Description   Final    BLOOD LEFT HAND Performed at [facility], [address], [place], [place] [address]    Special Requests   Final    BOTTLES DRAWN AEROBIC AND ANAEROBIC Blood Culture results may not be optimal due to an inadequate volume of blood received in culture bottles Performed at [facility], [address] [place], [place] [address]    Culture   Final    NO  GROWTH 4 DAYS Performed at [facility], [address] [place], [place] [address]    Report Status PENDING  Incomplete  Culture, blood (Routine x 2)     Status: None (Preliminary result)   Collection Time: 05/17/23  5:11 PM   Specimen: BLOOD  Result Value Ref Range Status   Specimen Description   Final    BLOOD RIGHT ANTECUBITAL Performed at [facility], [address] [place], [place] [address]    Special Requests   Final    BOTTLES DRAWN AEROBIC AND ANAEROBIC Blood Culture adequate volume Performed at [facility], [address] [place], [place] [address]    Culture   Final    NO GROWTH 4 DAYS Performed at [facility], [address] [place], [place] [address]    Report Status PENDING  Incomplete     Labs: BNP (last 3 results) No results for input(s): "BNP" in the last 8760 hours. Basic Metabolic Panel: Recent Labs  Lab 05/17/23 1629 05/18/23 0110 05/18/23 2305 05/19/23 0541 05/20/23 0528 05/21/23 0515  NA 133* 133* 132*  --  135 135  K 3.4* 3.2* 3.7  --  3.8 3.9  CL 98 99 100  --  104 103  CO2 24 24 23   --  21* 22  GLUCOSE 96 120* 108*  --  89 94  BUN 23 19 17   --  14 17  CREATININE 1.31* 1.23 1.27* 1.18 1.00 1.05  CALCIUM 8.7* 8.2* 7.9*  --  8.3* 8.4*  MG  --   --  2.1  --   --   --   PHOS  --   --  2.6  --   --   --    Liver Function Tests: Recent Labs  Lab 05/17/23 1629  AST  23  ALT 33  ALKPHOS 45  BILITOT 1.1  PROT 6.9  ALBUMIN 4.0   No results for input(s): "LIPASE", "AMYLASE" in the last 168 hours. No results for input(s): "AMMONIA" in the last 168 hours. CBC: Recent Labs  Lab 05/17/23 1629 05/18/23 0110 05/19/23 0541 05/20/23 0528 05/21/23 0515  WBC 6.4 4.5 3.7* 3.3* 4.1  NEUTROABS 5.0  --   --   --   --   HGB 12.9* 12.7* 11.7* 11.6* 11.8*  HCT 38.4* 38.5* 35.8* 36.5* 35.7*  MCV 90.4 92.5 93.0 93.4 91.8  PLT 181 159 153 174 203   Cardiac Enzymes: No results for input(s): "CKTOTAL", "CKMB",  "CKMBINDEX", "TROPONINI" in the last 168 hours. BNP: Invalid input(s): "POCBNP" CBG: No results for input(s): "GLUCAP" in the last 168 hours. D-Dimer No results for input(s): "DDIMER" in the last 72 hours. Hgb A1c No results for input(s): "HGBA1C" in the last 72 hours. Lipid Profile No results for input(s): "CHOL", "HDL", "LDLCALC", "TRIG", "CHOLHDL", "LDLDIRECT" in the last 72 hours. Thyroid function studies No results for input(s): "TSH", "T4TOTAL", "T3FREE", "THYROIDAB" in the last 72 hours.  Invalid input(s): "FREET3" Anemia work up No results for input(s): "VITAMINB12", "FOLATE", "FERRITIN", "TIBC", "IRON", "RETICCTPCT" in the last 72 hours. Urinalysis    Component Value Date/Time   COLORURINE YELLOW 02/08/2023 0744   APPEARANCEUR CLEAR 02/08/2023 0744   LABSPEC 1.020 02/08/2023 0744   PHURINE 6.5 02/08/2023 0744   GLUCOSEU NEGATIVE 02/08/2023 0744   HGBUR NEGATIVE 02/08/2023 0744   BILIRUBINUR NEGATIVE 02/08/2023 0744   BILIRUBINUR neg 07/02/2020 1655   KETONESUR TRACE (A) 02/08/2023 0744   PROTEINUR Negative 07/02/2020 1655   UROBILINOGEN 0.2 02/08/2023 0744   NITRITE NEGATIVE 02/08/2023 0744   LEUKOCYTESUR NEGATIVE 02/08/2023 0744   Sepsis Labs Recent Labs  Lab 05/18/23 0110 05/19/23 0541 05/20/23 0528 05/21/23 0515  WBC 4.5 3.7* 3.3* 4.1   Microbiology Recent Results (from the past 240 hour(s))  Culture, blood (Routine x 2)     Status: None (Preliminary result)   Collection Time: 05/17/23  4:29 PM   Specimen: BLOOD LEFT HAND  Result Value Ref Range Status   Specimen Description   Final    BLOOD LEFT HAND Performed at [facility], [address], [place], [place] [address]    Special Requests   Final    BOTTLES DRAWN AEROBIC AND ANAEROBIC Blood Culture results may not be optimal due to an inadequate volume of blood received in culture bottles Performed at [facility], [address] [place], [place] [address]    Culture   Final     NO GROWTH 4 DAYS Performed at [facility], [address], [place], [place] [address]    Report Status PENDING  Incomplete  Culture, blood (Routine x 2)     Status: None (Preliminary result)   Collection Time: 05/17/23  5:11 PM   Specimen: BLOOD  Result Value Ref Range Status   Specimen Description   Final    BLOOD RIGHT ANTECUBITAL Performed at [facility], [address] [place], [place] [address]    Special Requests   Final    BOTTLES DRAWN AEROBIC AND ANAEROBIC Blood Culture adequate volume Performed at [facility], [address] [place], [place] [address]    Culture   Final    NO GROWTH 4 DAYS Performed at [facility], [address] [place], [place] [address]  Report Status PENDING  Incomplete     Time coordinating discharge: Over 30 minutes  SIGNED:   Azucena Fallen, DO [facility] 05/21/2023, 4:33 PM Pager   If 7PM-7AM, please contact night-coverage [email or web address]

## 2023-05-22 ENCOUNTER — Telehealth: Payer: Self-pay

## 2023-05-22 LAB — CULTURE, BLOOD (ROUTINE X 2)
Culture: NO GROWTH
Culture: NO GROWTH
Special Requests: ADEQUATE

## 2023-05-22 NOTE — Transitions of Care (Post Inpatient/ED Visit) (Signed)
05/22/2023  Name: RIVAAN MCNELIS MRN: [id number] DOB: 26-Feb-1961  Today's TOC FU Call Status: Today's TOC FU Call Status:: Successful TOC FU Call Completed TOC FU Call Complete Date: 05/22/23 Patient's Name and Date of Birth confirmed.  Transition Care Management Follow-up Telephone Call Date of Discharge: 05/21/23 Discharge Facility: [facility]) Type of Discharge: Inpatient Admission Primary Inpatient Discharge Diagnosis:: Sepsis How have you been since you were released from the hospital?: Better Any questions or concerns?: Yes Patient Questions/Concerns:: Patient was told by pharmacy rep not to take Turmeric while taking the Eliquis, he wants PCP's recommendation on this. Patient also wants to know if it is ok to continue the OTC Testosterone booster and AG1 supplement? Patient Questions/Concerns Addressed: Notified Provider of Patient Questions/Concerns  Items Reviewed: Did you receive and understand the discharge instructions provided?: Yes Medications obtained,verified, and reconciled?: Yes (Medications Reviewed) Any new allergies since your discharge?: No Dietary orders reviewed?: NA Do you have support at home?: Yes People in Home: significant other  Medications Reviewed Today: Medications Reviewed Today     Reviewed by Leigh Aurora, CMA (Certified Medical Assistant) on 05/22/23 at 1114  Med List Status: <None>   Medication Order Taking? Sig Documenting Provider Last Dose Status Informant  apixaban (ELIQUIS) 5 MG TABS tablet [id number]  Take 1 tablet (5 mg total) by mouth 2 (two) times daily. Azucena Fallen, MD  Active   Ascorbic Acid (VITAMIN C WITH ROSE HIPS) 1000 MG tablet [id number] No Take 5,000 mg by mouth daily in the afternoon. [provider] 05/16/2023 Active Self  benazepril (LOTENSIN) 20 MG tablet [id number] No Take 1 tablet (20 mg total) by mouth daily. Plotnikov, Georgina Quint, MD 05/17/2023 am Active Self  cephALEXin (KEFLEX) 500 MG capsule  [id number]  Take 1 capsule (500 mg total) by mouth 4 (four) times daily for 7 days. Azucena Fallen, MD  Active   Cholecalciferol (VITAMIN D3) 50 MCG (2000 UT) TABS [id number] No Take 2,000 Units by mouth every evening. [provider] 05/16/2023 pm Active Self  Coenzyme Q10 (COQ10) 100 MG CAPS [id number] No Take 100 mg by mouth every morning. [provider] 05/17/2023 Active Self  Cyanocobalamin (B-12) 2000 MCG TABS [id number] No Take 2,000 mcg by mouth every morning. [provider] 05/17/2023 Active Self  diltiazem (CARDIZEM SR) 60 MG 12 hr capsule [id number]  Take 1 capsule (60 mg total) by mouth every 12 (twelve) hours. Azucena Fallen, MD  Active   ibuprofen (ADVIL) 200 MG tablet [id number] No Take 400 mg by mouth 2 (two) times daily. [provider] 05/17/2023 am Active Self           Med Note Antony Madura, Arn Medal   Wed May 17, 2023  8:10 PM)    levofloxacin (LEVAQUIN) 750 MG tablet [id number]  Take 1 tablet (750 mg total) by mouth daily for 7 days. Azucena Fallen, MD  Active   levothyroxine (SYNTHROID) 125 MCG tablet [id number] No Take 2 tablets (250 mcg total) by mouth daily before breakfast. TAKE 2 TABLETS BY MOUTH BEFORE BREAKFAST .  Patient taking differently: Take 250 mcg by mouth daily before breakfast.   Plotnikov, Georgina Quint, MD 05/17/2023 am Active Self  lovastatin (MEVACOR) 40 MG tablet [id number] No Take 1 tablet (40 mg total) by mouth at bedtime. Plotnikov, Georgina Quint, MD 05/16/2023 pm Active Self  Multiple Vitamin (MULTIVITAMIN) tablet [id number] No Take 1 tablet by mouth daily with breakfast. [provider] 05/17/2023 Active Self  Semaglutide (  RYBELSUS) 14 MG TABS [id number] No Take 1 tablet (14 mg total) by mouth daily.  Patient taking differently: Take 14 mg by mouth See admin instructions. Take 14 mg by mouth first thing in the morning, upon waking   Plotnikov, Georgina Quint, MD 05/17/2023 am Active Self  triamterene-hydrochlorothiazide (MAXZIDE-25)  37.5-25 MG tablet [id number] No Take 1 tablet by mouth daily. Plotnikov, Georgina Quint, MD 05/17/2023 am Active Self  TURMERIC PO [id number] No Take 1 capsule by mouth every evening. [provider] 05/16/2023 pm Active Self            Home Care and Equipment/Supplies: Were Home Health Services Ordered?: NA Any new equipment or medical supplies ordered?: NA  Functional Questionnaire: Do you need assistance with bathing/showering or dressing?: No Do you need assistance with meal preparation?: No Do you need assistance with eating?: No Do you have difficulty maintaining continence: No Do you need assistance with getting out of bed/getting out of a chair/moving?: No Do you have difficulty managing or taking your medications?: No  Follow up appointments reviewed: PCP Follow-up appointment confirmed?: No (pt needs a late appointment after 4:00 due to work, there is no availability so I have sent a message to PCP.) MD Provider Line Number:[phone] Given: Yes Specialist Hospital Follow-up appointment confirmed?: NA Do you need transportation to your follow-up appointment?: No Do you understand care options if your condition(s) worsen?: Yes-patient verbalized understanding    SIGNATURE Agnes Lawrence, CMA (AAMA)  [facility] [phone]

## 2023-05-23 ENCOUNTER — Emergency Department (HOSPITAL_COMMUNITY)
Admission: EM | Admit: 2023-05-23 | Discharge: 2023-05-23 | Disposition: A | Discharge status: Home / Self Care | Payer: BC Managed Care – PPO | Attending: Emergency Medicine | Admitting: Emergency Medicine

## 2023-05-23 ENCOUNTER — Telehealth: Payer: Self-pay | Admitting: Internal Medicine

## 2023-05-23 ENCOUNTER — Other Ambulatory Visit: Payer: Self-pay

## 2023-05-23 ENCOUNTER — Other Ambulatory Visit: Payer: BC Managed Care – PPO

## 2023-05-23 DIAGNOSIS — Z5189 Encounter for other specified aftercare: Secondary | ICD-10-CM

## 2023-05-23 DIAGNOSIS — M7989 Other specified soft tissue disorders: Secondary | ICD-10-CM | POA: Insufficient documentation

## 2023-05-23 DIAGNOSIS — Z4800 Encounter for change or removal of nonsurgical wound dressing: Secondary | ICD-10-CM | POA: Insufficient documentation

## 2023-05-23 DIAGNOSIS — Z7901 Long term (current) use of anticoagulants: Secondary | ICD-10-CM | POA: Diagnosis not present

## 2023-05-23 LAB — CBC WITH DIFFERENTIAL/PLATELET
Abs Immature Granulocytes: 0.09 10*3/uL — ABNORMAL HIGH (ref 0.00–0.07)
Basophils Absolute: 0.1 10*3/uL (ref 0.0–0.1)
Basophils Relative: 1 %
Eosinophils Absolute: 0.2 10*3/uL (ref 0.0–0.5)
Eosinophils Relative: 4 %
HCT: 42.6 % (ref 39.0–52.0)
Hemoglobin: 13.6 g/dL (ref 13.0–17.0)
Immature Granulocytes: 1 %
Lymphocytes Relative: 20 %
Lymphs Abs: 1.3 10*3/uL (ref 0.7–4.0)
MCH: 29.7 pg (ref 26.0–34.0)
MCHC: 31.9 g/dL (ref 30.0–36.0)
MCV: 93 fL (ref 80.0–100.0)
Monocytes Absolute: 0.6 10*3/uL (ref 0.1–1.0)
Monocytes Relative: 10 %
Neutro Abs: 4.1 10*3/uL (ref 1.7–7.7)
Neutrophils Relative %: 64 %
Platelets: 334 10*3/uL (ref 150–400)
RBC: 4.58 MIL/uL (ref 4.22–5.81)
RDW: 13 % (ref 11.5–15.5)
WBC: 6.4 10*3/uL (ref 4.0–10.5)
nRBC: 0 % (ref 0.0–0.2)

## 2023-05-23 LAB — COMPREHENSIVE METABOLIC PANEL
ALT: 43 U/L (ref 0–44)
AST: 25 U/L (ref 15–41)
Albumin: 3.7 g/dL (ref 3.5–5.0)
Alkaline Phosphatase: 52 U/L (ref 38–126)
Anion gap: 11 (ref 5–15)
BUN: 21 mg/dL (ref 8–23)
CO2: 21 mmol/L — ABNORMAL LOW (ref 22–32)
Calcium: 9 mg/dL (ref 8.9–10.3)
Chloride: 103 mmol/L (ref 98–111)
Creatinine, Ser: 1.29 mg/dL — ABNORMAL HIGH (ref 0.61–1.24)
GFR, Estimated: >60 mL/min (ref >60)
Glucose, Bld: 97 mg/dL (ref 70–99)
Potassium: 3.9 mmol/L (ref 3.5–5.1)
Sodium: 135 mmol/L (ref 135–145)
Total Bilirubin: 0.6 mg/dL (ref 0.3–1.2)
Total Protein: 7.2 g/dL (ref 6.5–8.1)

## 2023-05-23 NOTE — ED Provider Notes (Signed)
[facility] AT [facility] Provider Note   CSN: [id number] Arrival date & time: 05/23/23  1751     History  Chief Complaint  Patient presents with   Leg Swelling    ASTEN PELICAN is a 62 y.o. male.  This is a 62 year old male here today because of a blister on his left leg broke.  Patient was recently hospitalized, discharged with left leg cellulitis.  He says that the leg wound has been improving, redness, and pain has also been improving.  He sent a picture to a family member who is a PA, who thought that he should have his leg looked at.  He has not had fever or chills.  He has been taking his prescribed medications.  I reviewed the patient's hospital admission and discharge notes.        Home Medications Prior to Admission medications   Medication Sig Start Date End Date Taking? Authorizing Provider  apixaban (ELIQUIS) 5 MG TABS tablet Take 1 tablet (5 mg total) by mouth 2 (two) times daily. 05/21/23   Azucena Fallen, MD  Ascorbic Acid (VITAMIN C WITH ROSE HIPS) 1000 MG tablet Take 5,000 mg by mouth daily in the afternoon.    [provider]  benazepril (LOTENSIN) 20 MG tablet Take 1 tablet (20 mg total) by mouth daily. 03/14/23   Plotnikov, Georgina Quint, MD  cephALEXin (KEFLEX) 500 MG capsule Take 1 capsule (500 mg total) by mouth 4 (four) times daily for 7 days. 05/21/23 05/28/23  Azucena Fallen, MD  Cholecalciferol (VITAMIN D3) 50 MCG (2000 UT) TABS Take 2,000 Units by mouth every evening.    [provider]  Coenzyme Q10 (COQ10) 100 MG CAPS Take 100 mg by mouth every morning.    [provider]  Cyanocobalamin (B-12) 2000 MCG TABS Take 2,000 mcg by mouth every morning.    [provider]  diltiazem (CARDIZEM SR) 60 MG 12 hr capsule Take 1 capsule (60 mg total) by mouth every 12 (twelve) hours. 05/21/23   Azucena Fallen, MD  ibuprofen (ADVIL) 200 MG tablet Take 400 mg by mouth 2 (two) times daily.     [provider]  levofloxacin (LEVAQUIN) 750 MG tablet Take 1 tablet (750 mg total) by mouth daily for 7 days. 05/21/23 05/28/23  Azucena Fallen, MD  levothyroxine (SYNTHROID) 125 MCG tablet Take 2 tablets (250 mcg total) by mouth daily before breakfast. TAKE 2 TABLETS BY MOUTH BEFORE BREAKFAST . Patient taking differently: Take 250 mcg by mouth daily before breakfast. 03/14/23   Plotnikov, Georgina Quint, MD  lovastatin (MEVACOR) 40 MG tablet Take 1 tablet (40 mg total) by mouth at bedtime. 03/14/23   Plotnikov, Georgina Quint, MD  Multiple Vitamin (MULTIVITAMIN) tablet Take 1 tablet by mouth daily with breakfast.    [provider]  Semaglutide (RYBELSUS) 14 MG TABS Take 1 tablet (14 mg total) by mouth daily. Patient taking differently: Take 14 mg by mouth See admin instructions. Take 14 mg by mouth first thing in the morning, upon waking 09/13/22   Plotnikov, Georgina Quint, MD  triamterene-hydrochlorothiazide (MAXZIDE-25) 37.5-25 MG tablet Take 1 tablet by mouth daily. 03/14/23   Plotnikov, Georgina Quint, MD  TURMERIC PO Take 1 capsule by mouth every evening.    [provider]      Allergies    Penicillins and Watermelon [citrullus vulgaris]    Review of Systems   Review of Systems  Physical Exam Updated Vital Signs BP  116/65 (BP Location: Right Arm)   Pulse (!) 114   Temp 97.9 F (36.6 C) (Oral)   Resp 16   Ht 6' (1.829 m)   Wt (!) 146 kg   SpO2 99%   BMI 43.65 kg/m  Physical Exam Vitals reviewed.  Constitutional:      Appearance: He is not toxic-appearing.  Skin:    Comments: On the medial aspect of the left leg there is a ulceration of a recently popped Bouvier.  There is no surrounding crepitus.  There is mild erythema.  Slight swelling of the leg.     ED Results / Procedures / Treatments   Labs (all labs ordered are listed, but only abnormal results are displayed) Labs Reviewed  CBC WITH DIFFERENTIAL/PLATELET - Abnormal; Notable for the following components:       Result Value   Abs Immature Granulocytes 0.09 (*)    All other components within normal limits  COMPREHENSIVE METABOLIC PANEL - Abnormal; Notable for the following components:   CO2 21 (*)    Creatinine, Ser 1.29 (*)    All other components within normal limits    EKG None  Radiology No results found.  Procedures Procedures    Medications Ordered in ED Medications - No data to display  ED Course/ Medical Decision Making/ A&P                                 Medical Decision Making 62 year old male here today with left leg wound check.  Differential diagnoses included healing cellulitis, less likely necrotizing soft tissue infection.  Plan-looking at the patient's leg, I have low suspicion for necrotizing soft tissue infection.  Wound appears to be healing when compared with previous images in the patient's chart, as well as once that he shows me on his phone.  He has no systemic signs of infection.  Initial heart rate when patient was brought back was 111, did come down after being in the room.  He has been compliant with his antibiotics.  Patient says that it feels as though it is getting better to him, but he wanted the wound evaluated to make sure that things were not getting worse.  Discussed return precautions with the patient.  Will discharge.  Amount and/or Complexity of Data Reviewed Labs: ordered.           Final Clinical Impression(s) / ED Diagnoses Final diagnoses:  Visit for wound check    Rx / DC Orders ED Discharge Orders     None         Arletha Pili, DO 05/23/23 2118

## 2023-05-23 NOTE — ED Triage Notes (Signed)
Redness and swelling to left calf pt states was admitted for same last week. Pt states had a growing blister on calf and it popped today.

## 2023-05-23 NOTE — Telephone Encounter (Signed)
Noted! Thank you

## 2023-05-23 NOTE — Telephone Encounter (Signed)
Patient called and wanted to know if provider can squeeze him in today by any chance. Did express provider has no openings and did offer next available. Patient wanted to discuss new medications he was put on and now has a diagnosis of having heart problems. Please call

## 2023-05-23 NOTE — Discharge Instructions (Addendum)
You can wrap your leg with Xeroform, and Kerlix.  Take the dressing down each day and gently wash area soap and water.  Keep taking your medications as prescribed.  Like we discussed, come back to the emergency department if you develop fever, worsening pain, redness in your leg, or you notice pus coming from your leg.

## 2023-05-24 NOTE — Telephone Encounter (Signed)
Spoke with patient and tried to get him the next available slot 9/18 but wanted he wanted something sooner because he does not want to take off work. Suggested him to call first thing in the morning to get a [facility] appt pt stated he will do this. He had a blister that popped and the wrap that was used had a sticky adhesive that peeled the wound area raw. He went to the ED last night for his leg wound and stated he was in Afib.

## 2023-05-26 ENCOUNTER — Telehealth (INDEPENDENT_AMBULATORY_CARE_PROVIDER_SITE_OTHER): Payer: BC Managed Care – PPO | Admitting: Nurse Practitioner

## 2023-05-26 VITALS — HR 86

## 2023-05-26 DIAGNOSIS — L03116 Cellulitis of left lower limb: Secondary | ICD-10-CM | POA: Diagnosis not present

## 2023-05-26 DIAGNOSIS — I480 Paroxysmal atrial fibrillation: Secondary | ICD-10-CM

## 2023-05-26 MED ORDER — LEVOFLOXACIN 750 MG PO TABS
750.0000 mg | ORAL_TABLET | Freq: Every day | ORAL | 0 refills | Status: AC
Start: 2023-05-26 — End: 2023-06-02

## 2023-05-26 MED ORDER — CEPHALEXIN 500 MG PO CAPS: 500.0000 mg | ORAL_CAPSULE | Freq: Four times a day (QID) | ORAL | 0 refills | Status: AC

## 2023-05-26 NOTE — Progress Notes (Signed)
Established Patient Office Visit  An audio/visual tele-health visit was completed today for this patient. I connected with  [name] on 05/26/23 utilizing audio/visual technology and verified that I am speaking with the correct person using two identifiers. The patient was located at their home, and I was located at the office of [facility] at [facility] during the encounter. I discussed the limitations of evaluation and management by telemedicine. The patient expressed understanding and agreed to proceed.    Subjective   Patient ID: Randy [name], male    DOB: August 31, 1961  Age: 62 y.o. MRN: [id number]  Chief Complaint  Patient presents with   Post discharge follow-up   Estimated Creatinine Clearance: 87 mL/min (A) (by C-G formula based on SCr of 1.29 mg/dL (H)).   Patient arrives for visit for the above. He is 62 y/o with a past medical history significant for type 2 diabetes, hypertension, hypothyroidism, hyperlipidemia.   He was hospitalized from 05/17/23-05/21/23 for left leg cellulitis. 10 days prior to arrival to the ER he was tubing in a river and scraped his leg on a rock. His leg began to swell and had redness until he eventually presented to an outpatient office and was directed to the emergency department. He was ultimately admitted for sepsis secondary to cellulitis and was also noted to be in atrial fibrillation which was a new diagnosis for him. He was started on Eliquis and diltiazem.  He is seen today for follow-up.  He reports that his leg is improving but is still red, slightly swollen, and there is still some tenderness under the area of the wound that he had.  He is currently using gauze and changing dressing as needed.  He also reports that he is tolerating the Eliquis well and the diltiazem well.  He denies any chest pain or cardiac palpitations.  He does report some shortness of breath with exertion such as walking up stairs but describes it as mild.  He has  an upcoming appointment with his primary care provider for in person evaluation in about 2 weeks.    Review of Systems  Respiratory:  Negative for shortness of breath (with exertion).   Cardiovascular:  Positive for leg swelling. Negative for chest pain and palpitations.      Objective:     Pulse 86    Physical Exam Comprehensive physical exam not completed today as office visit was conducted remotely.  Patient appears well over video, no acute distress noted.  He does show me his leg with his video camera and I am able to see gauze wrap as well as some redness and possibly some edema to the lower part of his leg.  Dressing appears to be clean, dry, and intact.  Patient was alert and oriented, and appeared to have appropriate judgment.   No results found for any visits on 05/26/23.    The ASCVD Risk score (Arnett DK, et al., 2019) failed to calculate for the following reasons:   The valid total cholesterol range is 130 to 320 mg/dL    Assessment & Plan:   Problem List Items Addressed This Visit       Cardiovascular and Mediastinum   Paroxysmal atrial fibrillation with RVR (HCC)    Newly diagnosed during recent hospitalization Patient is tolerating Eliquis and diltiazem well Heart rate controlled at 86 from patient's at home device. Patient is hopeful atrial fibrillation may resolve as his infection resolves.  Would like referral to cardiology to  discuss possibly discontinuing medication if atrial fibrillation resolves. Referral made to cardiology today. Patient also encouraged to follow-up with his primary care provider in 2 weeks as currently scheduled, he reports he plans on doing so.      Relevant Orders   Ambulatory referral to Cardiology     Other   Left leg cellulitis - Primary    Acute, improving Because he still is experiencing redness, tenderness, and some mild swelling will extend his antibiotic course for an additional 7 days. Continue levofloxacin 75 mg  tablet daily and cephalexin 500 mg capsule 4 times a day. Patient denies excessive diarrhea but is having some loose stools.  Continue to monitor. Patient to follow-up with primary care provider as scheduled in 2 weeks, or sooner as needed. Patient to continue to monitor for worsening symptoms and seek care if this occurs.      Relevant Medications   cephALEXin (KEFLEX) 500 MG capsule   levofloxacin (LEVAQUIN) 750 MG tablet    Return With PCP in 2 weeks as scheduled.    Elenore Paddy, NP

## 2023-05-26 NOTE — Assessment & Plan Note (Signed)
Acute, improving Because he still is experiencing redness, tenderness, and some mild swelling will extend his antibiotic course for an additional 7 days. Continue levofloxacin 75 mg tablet daily and cephalexin 500 mg capsule 4 times a day. Patient denies excessive diarrhea but is having some loose stools.  Continue to monitor. Patient to follow-up with primary care provider as scheduled in 2 weeks, or sooner as needed. Patient to continue to monitor for worsening symptoms and seek care if this occurs.

## 2023-05-26 NOTE — Assessment & Plan Note (Signed)
Newly diagnosed during recent hospitalization Patient is tolerating Eliquis and diltiazem well Heart rate controlled at 86 from patient's at home device. Patient is hopeful atrial fibrillation may resolve as his infection resolves.  Would like referral to cardiology to discuss possibly discontinuing medication if atrial fibrillation resolves. Referral made to cardiology today. Patient also encouraged to follow-up with his primary care provider in 2 weeks as currently scheduled, he reports he plans on doing so.

## 2023-05-29 ENCOUNTER — Ambulatory Visit: Payer: BC Managed Care – PPO | Admitting: Internal Medicine

## 2023-06-13 ENCOUNTER — Encounter: Payer: Self-pay | Admitting: Internal Medicine

## 2023-06-13 ENCOUNTER — Ambulatory Visit: Payer: BC Managed Care – PPO | Admitting: Internal Medicine

## 2023-06-13 VITALS — BP 120/78 | HR 83 | Temp 98.4°F | Ht 72.0 in | Wt 307.0 lb

## 2023-06-13 DIAGNOSIS — M25562 Pain in left knee: Secondary | ICD-10-CM

## 2023-06-13 DIAGNOSIS — G8929 Other chronic pain: Secondary | ICD-10-CM

## 2023-06-13 DIAGNOSIS — E1169 Type 2 diabetes mellitus with other specified complication: Secondary | ICD-10-CM | POA: Diagnosis not present

## 2023-06-13 DIAGNOSIS — M25569 Pain in unspecified knee: Secondary | ICD-10-CM | POA: Insufficient documentation

## 2023-06-13 DIAGNOSIS — M5441 Lumbago with sciatica, right side: Secondary | ICD-10-CM

## 2023-06-13 DIAGNOSIS — Z7984 Long term (current) use of oral hypoglycemic drugs: Secondary | ICD-10-CM

## 2023-06-13 DIAGNOSIS — I480 Paroxysmal atrial fibrillation: Secondary | ICD-10-CM

## 2023-06-13 DIAGNOSIS — M25561 Pain in right knee: Secondary | ICD-10-CM

## 2023-06-13 DIAGNOSIS — E119 Type 2 diabetes mellitus without complications: Secondary | ICD-10-CM

## 2023-06-13 DIAGNOSIS — E669 Obesity, unspecified: Secondary | ICD-10-CM

## 2023-06-13 NOTE — Assessment & Plan Note (Signed)
On Rybelsus 

## 2023-06-13 NOTE — Assessment & Plan Note (Signed)
No LBP 

## 2023-06-13 NOTE — Assessment & Plan Note (Signed)
On Eliquis Rate is controlled on Cardizem

## 2023-06-13 NOTE — Assessment & Plan Note (Signed)
B knee OA Used to be on Advil 400 mg bid. Now is off due to anticoagulation Try Tylenol Blue-Emu cream was recommended to use 2-3 times a day

## 2023-06-13 NOTE — Patient Instructions (Signed)
  Use Tylenol Blue-Emu cream --use 2-3 times a day

## 2023-06-13 NOTE — Progress Notes (Unsigned)
Subjective:  Patient ID: Randy [name], male    DOB: Mar 31, 1961  Age: 62 y.o. MRN: [id number]  CC: Follow-up (4 MNTH F/U)   HPI Randy [name] presents for A fib, sepsis, LLE cellulitis f/u Per hx:   "Admit date: 05/17/2023 Discharge date: 05/21/2023   Admitted From: Home Disposition: Home   Recommendations for Outpatient Follow-up:  Follow up with PCP in 1-2 weeks   Home Health: None Equipment/Devices: None   Discharge Condition: Stable CODE STATUS: Full Diet recommendation: Low-salt low-fat low-carb diet   Brief/Interim Summary: Randy [name] is a 62 y.o. male with medical history significant of morbid obesity, hypertension, hyperlipidemia, type 2 diabetes and hypothyroidism s/p partial thyroidectomy who presents with left lower extremity pain, patient noted to have profound blanching erythema concerning for cellulitis.  Patient reports recent anterior left leg wound [place] approximately 10 days prior to admission.  Patient had not been on prior antibiotics, at intake patient's telemetry and EKG were consistent with A-fib which is a new diagnosis for the patient.  Patient admitted for further evaluation treatment of sepsis secondary to cellulitis with concurrent new onset A-fib with RVR.  Patient admitted as above with profound left lower extremity cellulitis pain needing criteria for sepsis.  Patient's pain improved drastically over the initial 24 hours however patient's erythema and posterior lower extremity edema continued to worsen.  Ultimately patient symptoms improved as did his clinical exam.  At this point we will transition patient over to cephalexin and Levaquin per guidelines, doxycycline was held given no clear exposure to vibrio.  Incidentally noted at intake patient had A-fib with RVR likely provoked secondary to above sepsis and pain, patient's heart rate remained somewhat elevated during hospitalization, improving with initiation of diltiazem.  At this  time patient's symptoms are well-controlled, heart rate well-controlled even with exertion.  Stable for discharge.  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   Discharge Diagnoses:  Principal Problem:   Sepsis (HCC) Active Problems:   Paroxysmal atrial fibrillation with RVR (HCC)   Mixed hyperlipidemia   Hypothyroidism   OBESITY, MORBID   Essential hypertension   Type 2 diabetes mellitus (HCC)   Cellulitis   Hypokalemia       Discharge Instructions     Allergies as of 05/21/2023         Reactions    Penicillins Other (See Comments)    Family history of allergic reaction     Watermelon [citrullus Vulgaris] Itching, Other (See Comments)    Makes throat itch             Medication List       STOP taking these medications     amLODipine 5 MG tablet Commonly known as: NORVASC    aspirin EC 81 MG tablet    baclofen 20 MG tablet Commonly known as: LIORESAL    meclizine 25 MG tablet Commonly known as: ANTIVERT           TAKE these medications     apixaban 5 MG Tabs tablet Commonly known as: ELIQUIS Take 1 tablet (5 mg total) by mouth 2 (two) times daily.    B-12 2000 MCG Tabs Take 2,000 mcg by mouth every morning.    benazepril 20 MG tablet Commonly known as: LOTENSIN Take 1 tablet (  20 mg total) by mouth daily.    cephALEXin 500 MG capsule Commonly known as: KEFLEX Take 1 capsule (500 mg total) by mouth 4 (four) times daily for 7 days.    CoQ10 100 MG Caps Take 100 mg by mouth every morning.    diltiazem 60 MG 12 hr capsule Commonly known as: CARDIZEM SR Take 1 capsule (60 mg total) by mouth every 12 (twelve) hours.    ibuprofen 200 MG tablet Commonly known as: ADVIL Take 400 mg by mouth 2 (two) times daily.    levofloxacin 750 MG tablet Commonly known as:  Levaquin Take 1 tablet (750 mg total) by mouth daily for 7 days.    levothyroxine 125 MCG tablet Commonly known as: SYNTHROID Take 2 tablets (250 mcg total) by mouth daily before breakfast. TAKE 2 TABLETS BY MOUTH BEFORE BREAKFAST . What changed: additional instructions    lovastatin 40 MG tablet Commonly known as: MEVACOR Take 1 tablet (40 mg total) by mouth at bedtime.    multivitamin tablet Take 1 tablet by mouth daily with breakfast.    Rybelsus 14 MG Tabs Generic drug: Semaglutide Take 1 tablet (14 mg total) by mouth daily. What changed:  when to take this additional instructions    triamterene-hydrochlorothiazide 37.5-25 MG tablet Commonly known as: MAXZIDE-25 Take 1 tablet by mouth daily.    TURMERIC PO Take 1 capsule by mouth every evening.    vitamin C with rose hips 1000 MG tablet Take 5,000 mg by mouth daily in the afternoon.    Vitamin D3 50 MCG (2000 UT) Tabs Take 2,000 Units by mouth every evening.               "  Outpatient Medications Prior to Visit  Medication Sig Dispense Refill  . acetaminophen (TYLENOL) 325 MG tablet Take 650 mg by mouth every 6 (six) hours as needed.    Marland Kitchen amLODipine (NORVASC) 5 MG tablet Take 5 mg by mouth daily.    Marland Kitchen apixaban (ELIQUIS) 5 MG TABS tablet Take 1 tablet (5 mg total) by mouth 2 (two) times daily. 60 tablet 1  . Ascorbic Acid (VITAMIN C WITH ROSE HIPS) 1000 MG tablet Take 5,000 mg by mouth daily in the afternoon.    . benazepril (LOTENSIN) 20 MG tablet Take 1 tablet (20 mg total) by mouth daily. 90 tablet 3  . Cholecalciferol (VITAMIN D3) 50 MCG (2000 UT) TABS Take 2,000 Units by mouth every evening.    . Coenzyme Q10 (COQ10) 100 MG CAPS Take 100 mg by mouth every morning.    . Cyanocobalamin (B-12) 2000 MCG TABS Take 2,000 mcg by mouth every morning.    . diltiazem (CARDIZEM SR) 60 MG 12 hr capsule Take 1 capsule (60 mg total) by mouth every 12 (twelve) hours. 60 capsule 0  . levothyroxine (SYNTHROID) 125 MCG  tablet Take 2 tablets (250 mcg total) by mouth daily before breakfast. TAKE 2 TABLETS BY MOUTH BEFORE BREAKFAST . (Patient taking differently: Take 250 mcg by mouth daily before breakfast.) 180 tablet 3  . lovastatin (MEVACOR) 40 MG tablet Take 1 tablet (40 mg total) by mouth at bedtime. 90 tablet 3  . Multiple Vitamin (MULTIVITAMIN) tablet Take 1 tablet by mouth daily with breakfast.    . OVER THE COUNTER MEDICATION 2 capsules in the morning and at bedtime. Testosterone booster - 6 star Has calcium, sodium, rodeola extract, gingko extract, and citrate    . Semaglutide (RYBELSUS) 14 MG TABS Take 1 tablet (14 mg  total) by mouth daily. (Patient taking differently: Take 14 mg by mouth See admin instructions. Take 14 mg by mouth first thing in the morning, upon waking) 30 tablet 11  . triamterene-hydrochlorothiazide (MAXZIDE-25) 37.5-25 MG tablet Take 1 tablet by mouth daily. 90 tablet 3  . TURMERIC PO Take 1 capsule by mouth every evening.     No facility-administered medications prior to visit.    ROS: Review of Systems  Constitutional:  Negative for appetite change, fatigue and unexpected weight change.  HENT:  Negative for congestion, nosebleeds, sneezing, sore throat and trouble swallowing.   Eyes:  Negative for itching and visual disturbance.  Respiratory:  Negative for cough.   Cardiovascular:  Negative for chest pain, palpitations and leg swelling.  Gastrointestinal:  Negative for abdominal distention, blood in stool, diarrhea and nausea.  Genitourinary:  Negative for frequency and hematuria.  Musculoskeletal:  Positive for back pain. Negative for gait problem, joint swelling and neck pain.  Skin:  Positive for color change. Negative for rash.  Neurological:  Negative for dizziness, tremors, speech difficulty and weakness.  Psychiatric/Behavioral:  Negative for agitation, dysphoric mood and sleep disturbance. The patient is not nervous/anxious.     Objective:  BP 120/78 (BP Location:  Right Arm, Patient Position: Sitting, Cuff Size: Normal)   Pulse 83   Temp 98.4 F (36.9 C) (Oral)   Ht 6' (1.829 m)   Wt (!) 307 lb (139.3 kg)   SpO2 99%   BMI 41.64 kg/m   BP Readings from Last 3 Encounters:  06/13/23 120/78  05/23/23 116/65  05/21/23 104/62    Wt Readings from Last 3 Encounters:  06/13/23 (!) 307 lb (139.3 kg)  05/23/23 (!) 321 lb 14 oz (146 kg)  05/17/23 (!) 322 lb 3.2 oz (146.1 kg)    Physical Exam Constitutional:      General: He is not in acute distress.    Appearance: He is well-developed. He is obese.     Comments: NAD  Eyes:     Conjunctiva/sclera: Conjunctivae normal.     Pupils: Pupils are equal, round, and reactive to light.  Neck:     Thyroid: No thyromegaly.     Vascular: No JVD.  Cardiovascular:     Rate and Rhythm: Normal rate. Rhythm irregular.     Heart sounds: Normal heart sounds. No murmur heard.    No friction rub. No gallop.  Pulmonary:     Effort: Pulmonary effort is normal. No respiratory distress.     Breath sounds: Normal breath sounds. No wheezing or rales.  Chest:     Chest wall: No tenderness.  Abdominal:     General: Bowel sounds are normal. There is no distension.     Palpations: Abdomen is soft. There is no mass.     Tenderness: There is no abdominal tenderness. There is no guarding or rebound.  Musculoskeletal:        General: No tenderness. Normal range of motion.     Cervical back: Normal range of motion.  Lymphadenopathy:     Cervical: No cervical adenopathy.  Skin:    General: Skin is warm and dry.     Findings: Bruising present. No rash.  Neurological:     Mental Status: He is alert and oriented to person, place, and time.     Cranial Nerves: No cranial nerve deficit.     Motor: No abnormal muscle tone.     Coordination: Coordination normal.     Gait: Gait normal.  Deep Tendon Reflexes: Reflexes are normal and symmetric.  Psychiatric:        Behavior: Behavior normal.        Thought Content:  Thought content normal.        Judgment: Judgment normal.  Purple discoloration - L posterior calf Trace  edema B    A total time of 45 minutes was spent preparing to see the patient, reviewing tests, x-rays, operative reports and other medical records.  Also, obtaining history and performing comprehensive physical exam.  Additionally, counseling the patient regarding the above listed issues - sepsis, A fib.   Finally, documenting clinical information in the health records, coordination of care, educating the patient. It is a complex case.    Lab Results  Component Value Date   WBC 6.4 05/23/2023   HGB 13.6 05/23/2023   HCT 42.6 05/23/2023   PLT 334 05/23/2023   GLUCOSE 97 05/23/2023   CHOL 110 02/08/2023   TRIG 67.0 02/08/2023   HDL 32.50 (L) 02/08/2023   LDLCALC 64 02/08/2023   ALT 43 05/23/2023   AST 25 05/23/2023   NA 135 05/23/2023   K 3.9 05/23/2023   CL 103 05/23/2023   CREATININE 1.29 (H) 05/23/2023   BUN 21 05/23/2023   CO2 21 (L) 05/23/2023   TSH 0.393 05/18/2023   PSA 1.09 02/08/2023   INR 1.2 05/17/2023   HGBA1C 5.3 02/08/2023   MICROALBUR 4.9 (H) 02/08/2023    No results found.  Assessment & Plan:   Problem List Items Addressed This Visit     Low back pain    No LBP      Relevant Orders   Comprehensive metabolic panel   TSH   T4, free   CBC with Differential/Platelet   Hemoglobin A1c   Type 2 diabetes mellitus (HCC)    On Rybelsus      Relevant Orders   Comprehensive metabolic panel   TSH   T4, free   CBC with Differential/Platelet   Hemoglobin A1c   Paroxysmal atrial fibrillation with RVR (HCC) - Primary    On Eliquis Rate is controlled on Cardizem      Relevant Medications   amLODipine (NORVASC) 5 MG tablet   Other Relevant Orders   Ambulatory referral to Cardiology   Comprehensive metabolic panel   TSH   T4, free   CBC with Differential/Platelet   Hemoglobin A1c   Knee pain    B knee OA Used to be on Advil 400 mg bid. Now  is off due to anticoagulation Try Tylenol Blue-Emu cream was recommended to use 2-3 times a day       Other Visit Diagnoses     Type 2 diabetes mellitus with obesity (HCC)       Relevant Orders   Hemoglobin A1c         No orders of the defined types were placed in this encounter.     Follow-up: Return in about 3 months (around 09/13/2023) for a follow-up visit.  Sonda Primes, MD

## 2023-06-14 ENCOUNTER — Telehealth: Payer: Self-pay | Admitting: Internal Medicine

## 2023-06-14 NOTE — Telephone Encounter (Signed)
Prescription Request  06/14/2023  LOV: 06/13/2023  What is the name of the medication or equipment? apixaban (ELIQUIS) 5 MG TABS tablet  diltiazem (CARDIZEM SR) 60 MG 12 hr capsule  Have you contacted your pharmacy to request a refill? Yes   Which pharmacy would you like this sent to?  [facility] [address] [place], [place] - [address] [place] [address] Phone: [phone] Fax: [phone]    Patient notified that their request is being sent to the clinical staff for review and that they should receive a response within 2 business days.   Please advise at Mobile [phone] (mobile)

## 2023-06-15 MED ORDER — APIXABAN 5 MG PO TABS: 5.0000 mg | ORAL_TABLET | Freq: Two times a day (BID) | ORAL | 5 refills | Status: DC

## 2023-06-15 MED ORDER — DILTIAZEM HCL ER 60 MG PO CP12: 60.0000 mg | ORAL_CAPSULE | Freq: Two times a day (BID) | ORAL | 5 refills | Status: DC

## 2023-06-15 NOTE — Telephone Encounter (Signed)
It is done.  Thanks 

## 2023-06-27 ENCOUNTER — Encounter: Payer: Self-pay | Admitting: Internal Medicine

## 2023-06-27 ENCOUNTER — Telehealth: Payer: Self-pay

## 2023-06-27 ENCOUNTER — Ambulatory Visit: Payer: BC Managed Care – PPO | Attending: Internal Medicine | Admitting: Internal Medicine

## 2023-06-27 ENCOUNTER — Telehealth: Payer: Self-pay | Admitting: *Deleted

## 2023-06-27 VITALS — BP 116/80 | HR 65 | Ht 72.0 in | Wt 308.6 lb

## 2023-06-27 DIAGNOSIS — Z01810 Encounter for preprocedural cardiovascular examination: Secondary | ICD-10-CM

## 2023-06-27 DIAGNOSIS — I480 Paroxysmal atrial fibrillation: Secondary | ICD-10-CM

## 2023-06-27 DIAGNOSIS — R0683 Snoring: Secondary | ICD-10-CM

## 2023-06-27 NOTE — Telephone Encounter (Signed)
DR. Tenny Craw ordered Itamar study.   Patient agreement reviewed and signed on 06/27/2023.  WatchPAT issued to patient on 06/27/2023 by Danielle Rankin, CMA. Patient aware to not open the WatchPAT box until contacted with the activation PIN. Patient profile initialized in CloudPAT on 06/27/2023 by Danielle Rankin, CMA. Device serial number: [id number]  Please list Reason for Call as Advice Only and type "WatchPAT issued to patient" in the comment box.

## 2023-06-27 NOTE — Telephone Encounter (Signed)
Pt needs cardioversion in November.

## 2023-06-27 NOTE — Patient Instructions (Signed)
Medication Instructions:  NO MISSED DOSES OF YOUR ELIQUIS  *If you need a refill on your cardiac medications before your next appointment, please call your pharmacy*   Lab Work:  If you have labs (blood work) drawn today and your tests are completely normal, you will receive your results only by: MyChart Message (if you have MyChart) OR A paper copy in the mail If you have any lab test that is abnormal or we need to change your treatment, we will call you to review the results.   Testing/Procedures:  Randy [name] SLEEP STUDY   Your physician has recommended that you have a Cardioversion (DCCV). Electrical Cardioversion uses a jolt of electricity to your heart either through paddles or wired patches attached to your chest. This is a controlled, usually prescheduled, procedure. Defibrillation is done under light anesthesia in the hospital, and you usually go home the day of the procedure. This is done to get your heart back into a normal rhythm. You are not awake for the procedure. Please see the instruction sheet given to you today.    Follow-Up: At [facility], you and your health needs are our priority.  As part of our continuing mission to provide you with exceptional heart care, we have created designated Provider Care Teams.  These Care Teams include your primary Cardiologist (physician) and Advanced Practice Providers (APPs -  Physician Assistants and Nurse Practitioners) who all work together to provide you with the care you need, when you need it.  We recommend signing up for the patient portal called "MyChart".  Sign up information is provided on this After Visit Summary.  MyChart is used to connect with patients for Virtual Visits (Telemedicine).  Patients are able to view lab/test results, encounter notes, upcoming appointments, etc.  Non-urgent messages can be sent to your provider as well.   To learn more about what you can do with MyChart, go to [email or web address].     Your next appointment:  2 WEEKS AFTER CARDIOVERSION

## 2023-06-27 NOTE — Progress Notes (Signed)
Cardiology Office Note   Date:  06/27/2023   ID:  Randy [name], DOB [birth year], MRN [id number]  PCP:  Randy Garter, MD  Cardiologist:   Randy Pates, MD    Pt presents for evaluation of atrial fibrillation    History of Present Illness: Randy [name] is a 62 y.o. male with a history of HTN, HL, T2DM, hypothyroidism, morbid obesity    He was recently admitted to [facility] with leg infection   Found to be in atrial fibrllation.  Felt to possibly be due to infection    Sent home on diltiazem and Eliquis  Since seen he denies palpitations  But he says he still gets winded with stairs.   He did not have that happen in the past   Says his overall energy is down    Denies CP   Scheduled for Ca score CT tomorrow         Current Meds  Medication Sig   acetaminophen (TYLENOL) 325 MG tablet Take 650 mg by mouth every 6 (six) hours as needed.   amLODipine (NORVASC) 5 MG tablet Take 5 mg by mouth daily.   apixaban (ELIQUIS) 5 MG TABS tablet Take 1 tablet (5 mg total) by mouth 2 (two) times daily.   Ascorbic Acid (VITAMIN C WITH ROSE HIPS) 1000 MG tablet Take 5,000 mg by mouth daily in the afternoon.   benazepril (LOTENSIN) 20 MG tablet Take 1 tablet (20 mg total) by mouth daily.   Cholecalciferol (VITAMIN D3) 50 MCG (2000 UT) TABS Take 2,000 Units by mouth every evening.   Coenzyme Q10 (COQ10) 100 MG CAPS Take 100 mg by mouth every morning.   Cyanocobalamin (B-12) 2000 MCG TABS Take 2,000 mcg by mouth every morning.   diltiazem (CARDIZEM SR) 60 MG 12 hr capsule Take 1 capsule (60 mg total) by mouth every 12 (twelve) hours.   levothyroxine (SYNTHROID) 125 MCG tablet Take 2 tablets (250 mcg total) by mouth daily before breakfast. TAKE 2 TABLETS BY MOUTH BEFORE BREAKFAST . (Patient taking differently: Take 250 mcg by mouth daily before breakfast.)   lovastatin (MEVACOR) 40 MG tablet Take 1 tablet (40 mg total) by mouth at bedtime.   Multiple Vitamin (MULTIVITAMIN) tablet Take 1 tablet  by mouth daily with breakfast.   OVER THE COUNTER MEDICATION 2 capsules in the morning and at bedtime. Testosterone booster - 6 star Has calcium, sodium, rodeola extract, gingko extract, and citrate   OVER THE COUNTER MEDICATION Athletic Greens Supplement taking in the morning.   Semaglutide (RYBELSUS) 14 MG TABS Take 1 tablet (14 mg total) by mouth daily. (Patient taking differently: Take 14 mg by mouth See admin instructions. Take 14 mg by mouth first thing in the morning, upon waking)   triamterene-hydrochlorothiazide (MAXZIDE-25) 37.5-25 MG tablet Take 1 tablet by mouth daily.   [DISCONTINUED] TURMERIC PO Take 1 capsule by mouth every evening.     Allergies:   Penicillins and Watermelon [citrullus vulgaris]   Past Medical History:  Diagnosis Date   At risk for sleep apnea    STOP-BANG= 6         SENT TO PCP 06-24-2016   Back pain    History of thyroid nodule    s/p  right thyroid lopectomy--  per path report:  follicular adenoma, chronic thyroiditis   Hyperlipidemia    Hypertension    Hypogonadism male    Hypothyroidism    Obesity    Other specified disorders of tendon, right knee  quad tendon tear   SOB (shortness of breath)    Vitamin D deficiency    Wears glasses     Past Surgical History:  Procedure Laterality Date   COLONOSCOPY  2006   EYE SURGERY Right age 26   removal glass   HYPOSPADIAS CORRECTION  age 46   IR CERVICAL/THORACIC DISC ASPIRATION W/IMAG GUIDE  03/31/2020       IR CERVICAL/THORACIC DISC ASPIRATION W/IMAG GUIDE  03/31/2020   QUADRICEPS TENDON REPAIR Left 06/28/2016   Procedure: LEFT KNEE REPAIR QUADRICEP TENDON;  Surgeon: Eugenia Mcalpine, MD;  Location: [facility];  Service: Orthopedics;  Laterality: Left;   THYROID LOBECTOMY Right 02/03/2005     Social History:  The patient  reports that he has never smoked. He has never used smokeless tobacco. He reports that he does not currently use alcohol after a past usage of about 1.0  standard drink of alcohol per week. He reports that he does not use drugs.   Family History:  The patient's family history includes Alcohol abuse in his father; Anxiety disorder in his mother; Arthritis in his father; COPD in his father; Depression in his mother; Heart disease in his father and mother; Hyperlipidemia in his father and mother; Hypertension in his father and mother; Liver disease in his father; Mental illness in his mother; Schizophrenia in his mother; Sleep apnea in his father; Thyroid disease in his father and mother.    ROS:  Please see the history of present illness. All other systems are reviewed and  Negative to the above problem except as noted.    PHYSICAL EXAM: VS:  BP 116/80   Pulse 65   Ht 6' (1.829 m)   Wt (!) 308 lb 9.6 oz (140 kg)   SpO2 95%   BMI 41.85 kg/m   GEN: Morbidly obese 62 yo  in no acute distress  HEENT: normal  Neck: no JVD, carotid bruits Cardiac: Irrreg irreg  No S3  No murmurs  Triv LE edema  Respiratory:  clear to auscultation GI: soft, nontender  No obvious masses No hepatomegaly     EKG:  EKG is not ordered today.  Rhythm strip shows atrial fibrillation with occasional PVC     Echo  Sept 2024   1. Left ventricular ejection fraction, by estimation, is 50 to 55%. The  left ventricle has low normal function. The left ventricle has no regional  wall motion abnormalities. There is mild asymmetric left ventricular  hypertrophy of the basal and septal  segments. Left ventricular diastolic parameters are indeterminate.   2. Right ventricular systolic function is normal. The right ventricular  size is normal.   3. Left atrial size was severely dilated.   4. Right atrial size was moderately dilated.   5. The mitral valve is abnormal. Mild mitral valve regurgitation. No  evidence of mitral stenosis.   6. The aortic valve is tricuspid. There is mild calcification of the  aortic valve. There is mild thickening of the aortic valve. Aortic  valve  regurgitation is not visualized. Aortic valve sclerosis is present, with  no evidence of aortic valve stenosis.   7. The inferior vena cava is normal in size with greater than 50%  respiratory variability, suggesting right atrial pressure of 3 mmHg.   Lipid Panel    Component Value Date/Time   CHOL 110 02/08/2023 0744   CHOL 142 02/20/2020 1136   TRIG 67.0 02/08/2023 0744   HDL 32.50 (L) 02/08/2023 0744  HDL 38 (L) 02/20/2020 1136   CHOLHDL 3 02/08/2023 0744   VLDL 13.4 02/08/2023 0744   LDLCALC 64 02/08/2023 0744   LDLCALC 89 02/20/2020 1136      Wt Readings from Last 3 Encounters:  06/27/23 (!) 308 lb 9.6 oz (140 kg)  06/13/23 (!) 307 lb (139.3 kg)  05/23/23 (!) 321 lb 14 oz (146 kg)      ASSESSMENT AND PLAN:  1  Atrial fibrillation   Pt recently found to be in atrial fibrillation in 05/2023 when admitted for leg infection (CHADSVASc score is 2)  Not clear how long he had been in it     He is still in afib    Echo shows severe LAE sugg that may have been more chronic The pt complains of fatigue, dysnea with stairs   Will set up for DC cardioversion Keep on same meds for now   2   HTN  Well controlled   3  Risk stratification   Pt set up for Ca score CT    4 HL  Lipids well controlled   5  ? Sleep apnea   Sleps alone   Has been told he snores  Will set up for sleep study     Body habitus is increased risk   6  Morbid obesity   Pt has lost about 80 lbs on current regimen       Plan for cardioversion in November    Follow up after    Current medicines are reviewed at length with the patient today.  The patient does not have concerns regarding medicines.  Signed, Randy Pates, MD  06/27/2023 1:38 PM    [facility] [address] [place], [place], [place]  [address] Phone: [phone]; Fax: [phone]

## 2023-06-27 NOTE — H&P (View-Only) (Signed)
Cardiology Office Note   Date:  06/27/2023   ID:  Randy [name], DOB [birth year], MRN [id number]  PCP:  Tresa Garter, MD  Cardiologist:   Dietrich Pates, MD    Pt presents for evaluation of atrial fibrillation    History of Present Illness: Randy [name] is a 62 y.o. male with a history of HTN, HL, T2DM, hypothyroidism, morbid obesity    He was recently admitted to [facility] with leg infection   Found to be in atrial fibrllation.  Felt to possibly be due to infection    Sent home on diltiazem and Eliquis  Since seen he denies palpitations  But he says he still gets winded with stairs.   He did not have that happen in the past   Says his overall energy is down    Denies CP   Scheduled for Ca score CT tomorrow         Current Meds  Medication Sig   acetaminophen (TYLENOL) 325 MG tablet Take 650 mg by mouth every 6 (six) hours as needed.   amLODipine (NORVASC) 5 MG tablet Take 5 mg by mouth daily.   apixaban (ELIQUIS) 5 MG TABS tablet Take 1 tablet (5 mg total) by mouth 2 (two) times daily.   Ascorbic Acid (VITAMIN C WITH ROSE HIPS) 1000 MG tablet Take 5,000 mg by mouth daily in the afternoon.   benazepril (LOTENSIN) 20 MG tablet Take 1 tablet (20 mg total) by mouth daily.   Cholecalciferol (VITAMIN D3) 50 MCG (2000 UT) TABS Take 2,000 Units by mouth every evening.   Coenzyme Q10 (COQ10) 100 MG CAPS Take 100 mg by mouth every morning.   Cyanocobalamin (B-12) 2000 MCG TABS Take 2,000 mcg by mouth every morning.   diltiazem (CARDIZEM SR) 60 MG 12 hr capsule Take 1 capsule (60 mg total) by mouth every 12 (twelve) hours.   levothyroxine (SYNTHROID) 125 MCG tablet Take 2 tablets (250 mcg total) by mouth daily before breakfast. TAKE 2 TABLETS BY MOUTH BEFORE BREAKFAST . (Patient taking differently: Take 250 mcg by mouth daily before breakfast.)   lovastatin (MEVACOR) 40 MG tablet Take 1 tablet (40 mg total) by mouth at bedtime.   Multiple Vitamin (MULTIVITAMIN) tablet Take 1 tablet  by mouth daily with breakfast.   OVER THE COUNTER MEDICATION 2 capsules in the morning and at bedtime. Testosterone booster - 6 star Has calcium, sodium, rodeola extract, gingko extract, and citrate   OVER THE COUNTER MEDICATION Athletic Greens Supplement taking in the morning.   Semaglutide (RYBELSUS) 14 MG TABS Take 1 tablet (14 mg total) by mouth daily. (Patient taking differently: Take 14 mg by mouth See admin instructions. Take 14 mg by mouth first thing in the morning, upon waking)   triamterene-hydrochlorothiazide (MAXZIDE-25) 37.5-25 MG tablet Take 1 tablet by mouth daily.   [DISCONTINUED] TURMERIC PO Take 1 capsule by mouth every evening.     Allergies:   Penicillins and Watermelon [citrullus vulgaris]   Past Medical History:  Diagnosis Date   At risk for sleep apnea    STOP-BANG= 6         SENT TO PCP 06-24-2016   Back pain    History of thyroid nodule    s/p  right thyroid lopectomy--  per path report:  follicular adenoma, chronic thyroiditis   Hyperlipidemia    Hypertension    Hypogonadism male    Hypothyroidism    Obesity    Other specified disorders of tendon, right knee  quad tendon tear   SOB (shortness of breath)    Vitamin D deficiency    Wears glasses     Past Surgical History:  Procedure Laterality Date   COLONOSCOPY  2006   EYE SURGERY Right age 26   removal glass   HYPOSPADIAS CORRECTION  age 46   IR CERVICAL/THORACIC DISC ASPIRATION W/IMAG GUIDE  03/31/2020       IR CERVICAL/THORACIC DISC ASPIRATION W/IMAG GUIDE  03/31/2020   QUADRICEPS TENDON REPAIR Left 06/28/2016   Procedure: LEFT KNEE REPAIR QUADRICEP TENDON;  Surgeon: Eugenia Mcalpine, MD;  Location: [facility];  Service: Orthopedics;  Laterality: Left;   THYROID LOBECTOMY Right 02/03/2005     Social History:  The patient  reports that he has never smoked. He has never used smokeless tobacco. He reports that he does not currently use alcohol after a past usage of about 1.0  standard drink of alcohol per week. He reports that he does not use drugs.   Family History:  The patient's family history includes Alcohol abuse in his father; Anxiety disorder in his mother; Arthritis in his father; COPD in his father; Depression in his mother; Heart disease in his father and mother; Hyperlipidemia in his father and mother; Hypertension in his father and mother; Liver disease in his father; Mental illness in his mother; Schizophrenia in his mother; Sleep apnea in his father; Thyroid disease in his father and mother.    ROS:  Please see the history of present illness. All other systems are reviewed and  Negative to the above problem except as noted.    PHYSICAL EXAM: VS:  BP 116/80   Pulse 65   Ht 6' (1.829 m)   Wt (!) 308 lb 9.6 oz (140 kg)   SpO2 95%   BMI 41.85 kg/m   GEN: Morbidly obese 62 yo  in no acute distress  HEENT: normal  Neck: no JVD, carotid bruits Cardiac: Irrreg irreg  No S3  No murmurs  Triv LE edema  Respiratory:  clear to auscultation GI: soft, nontender  No obvious masses No hepatomegaly     EKG:  EKG is not ordered today.  Rhythm strip shows atrial fibrillation with occasional PVC     Echo  Sept 2024   1. Left ventricular ejection fraction, by estimation, is 50 to 55%. The  left ventricle has low normal function. The left ventricle has no regional  wall motion abnormalities. There is mild asymmetric left ventricular  hypertrophy of the basal and septal  segments. Left ventricular diastolic parameters are indeterminate.   2. Right ventricular systolic function is normal. The right ventricular  size is normal.   3. Left atrial size was severely dilated.   4. Right atrial size was moderately dilated.   5. The mitral valve is abnormal. Mild mitral valve regurgitation. No  evidence of mitral stenosis.   6. The aortic valve is tricuspid. There is mild calcification of the  aortic valve. There is mild thickening of the aortic valve. Aortic  valve  regurgitation is not visualized. Aortic valve sclerosis is present, with  no evidence of aortic valve stenosis.   7. The inferior vena cava is normal in size with greater than 50%  respiratory variability, suggesting right atrial pressure of 3 mmHg.   Lipid Panel    Component Value Date/Time   CHOL 110 02/08/2023 0744   CHOL 142 02/20/2020 1136   TRIG 67.0 02/08/2023 0744   HDL 32.50 (L) 02/08/2023 0744  HDL 38 (L) 02/20/2020 1136   CHOLHDL 3 02/08/2023 0744   VLDL 13.4 02/08/2023 0744   LDLCALC 64 02/08/2023 0744   LDLCALC 89 02/20/2020 1136      Wt Readings from Last 3 Encounters:  06/27/23 (!) 308 lb 9.6 oz (140 kg)  06/13/23 (!) 307 lb (139.3 kg)  05/23/23 (!) 321 lb 14 oz (146 kg)      ASSESSMENT AND PLAN:  1  Atrial fibrillation   Pt recently found to be in atrial fibrillation in 05/2023 when admitted for leg infection (CHADSVASc score is 2)  Not clear how long he had been in it     He is still in afib    Echo shows severe LAE sugg that may have been more chronic The pt complains of fatigue, dysnea with stairs   Will set up for DC cardioversion Keep on same meds for now   2   HTN  Well controlled   3  Risk stratification   Pt set up for Ca score CT    4 HL  Lipids well controlled   5  ? Sleep apnea   Sleps alone   Has been told he snores  Will set up for sleep study     Body habitus is increased risk   6  Morbid obesity   Pt has lost about 80 lbs on current regimen       Plan for cardioversion in November    Follow up after    Current medicines are reviewed at length with the patient today.  The patient does not have concerns regarding medicines.  Signed, Dietrich Pates, MD  06/27/2023 1:38 PM    [facility] [address] [place], [place], [place]  [address] Phone: [phone]; Fax: [phone]

## 2023-06-28 ENCOUNTER — Ambulatory Visit
Admission: RE | Admit: 2023-06-28 | Discharge: 2023-06-28 | Disposition: A | Discharge status: Home / Self Care | Payer: No Typology Code available for payment source | Source: Ambulatory Visit | Attending: Internal Medicine | Admitting: Internal Medicine

## 2023-06-28 DIAGNOSIS — E785 Hyperlipidemia, unspecified: Secondary | ICD-10-CM

## 2023-06-29 ENCOUNTER — Other Ambulatory Visit: Payer: Self-pay

## 2023-06-29 DIAGNOSIS — I480 Paroxysmal atrial fibrillation: Secondary | ICD-10-CM

## 2023-06-29 DIAGNOSIS — Z01810 Encounter for preprocedural cardiovascular examination: Secondary | ICD-10-CM

## 2023-06-29 NOTE — Telephone Encounter (Signed)
Pt advised and sent to his My Chart.Marland KitchenMarland KitchenCardioversion for 07/27/23. See letter.

## 2023-07-10 ENCOUNTER — Telehealth: Payer: Self-pay | Admitting: Internal Medicine

## 2023-07-10 NOTE — Telephone Encounter (Signed)
Patient is calling stating he was unable to go through with the CT Cardiac Scoring on 10/16 due to being in Afib.   He wanted to make Dr. Tenny Craw aware of this due to her advising at his appt she would be interested in seeing the results of it.   Please advise.

## 2023-07-10 NOTE — Telephone Encounter (Signed)
**Note De-Identified Randy [name] Obfuscation** Ordering provider: Dr Tenny Craw Associated diagnoses: Snoring-R06.83 and Fatigue-R53.83 WatchPAT PA obtained on 07/10/2023 by Irva Loser, Lorelle Formosa, LPN. Authorization: Per [employer] Provider Portal: The following solutions for the service date entered do not require Pre-Authorization by [employer]. CPT Code: 95800-Itamar-HST Patient notified of PIN (1234) on 07/10/2023 Randy [name] Notification Method: phone. I did request that he do his HST within 2 weeks of today's date and he stated that he will.  Phone note routed to covering staff for follow-up.

## 2023-07-10 NOTE — Telephone Encounter (Signed)
Spoke with the patient and he is going to wait to reschedule his calcium score until after this cardioversion on 11/14.

## 2023-07-13 ENCOUNTER — Ambulatory Visit: Payer: BC Managed Care – PPO | Admitting: Internal Medicine

## 2023-07-16 NOTE — Pre-Procedure Instructions (Signed)
Patient scheduled for procedure on Nov 14 with anesthesia.  Anesthesia requires Rybelsus to be held for 24 hours before procedure.  Patient last dose will be Wednesday 11/12, he verbalized understanding.

## 2023-07-23 ENCOUNTER — Encounter (INDEPENDENT_AMBULATORY_CARE_PROVIDER_SITE_OTHER): Payer: BC Managed Care – PPO | Admitting: Cardiology

## 2023-07-23 DIAGNOSIS — G4733 Obstructive sleep apnea (adult) (pediatric): Secondary | ICD-10-CM | POA: Diagnosis not present

## 2023-07-26 LAB — CBC
Hematocrit: 40.7 % (ref 37.5–51.0)
Hemoglobin: 13.7 g/dL (ref 13.0–17.7)
MCH: 30.6 pg (ref 26.6–33.0)
MCHC: 33.7 g/dL (ref 31.5–35.7)
MCV: 91 fL (ref 79–97)
Platelets: 221 10*3/uL (ref 150–450)
RBC: 4.48 x10E6/uL (ref 4.14–5.80)
RDW: 13 % (ref 11.6–15.4)
WBC: 5 10*3/uL (ref 3.4–10.8)

## 2023-07-26 NOTE — Progress Notes (Signed)
Spoke to pt and instructed them to come at 0845 and to be NPO after 0000. Confirmed no missed doses of AC and instructed to take in AM with a small sip of water.   Confirmed that pt will have a ride home and someone to stay with them for 24 hours after the procedure.

## 2023-07-27 ENCOUNTER — Ambulatory Visit (HOSPITAL_COMMUNITY): Payer: BC Managed Care – PPO | Admitting: Anesthesiology

## 2023-07-27 ENCOUNTER — Other Ambulatory Visit: Payer: Self-pay

## 2023-07-27 ENCOUNTER — Encounter (HOSPITAL_COMMUNITY)
Admission: RE | Disposition: A | Discharge status: Home / Self Care | Payer: Self-pay | Source: Home / Self Care | Attending: Cardiovascular Disease

## 2023-07-27 ENCOUNTER — Ambulatory Visit (HOSPITAL_COMMUNITY)
Admission: RE | Admit: 2023-07-27 | Discharge: 2023-07-27 | Disposition: A | Discharge status: Home / Self Care | Payer: BC Managed Care – PPO | Attending: Cardiovascular Disease | Admitting: Cardiovascular Disease

## 2023-07-27 DIAGNOSIS — I10 Essential (primary) hypertension: Secondary | ICD-10-CM | POA: Diagnosis not present

## 2023-07-27 DIAGNOSIS — Z79899 Other long term (current) drug therapy: Secondary | ICD-10-CM | POA: Insufficient documentation

## 2023-07-27 DIAGNOSIS — I480 Paroxysmal atrial fibrillation: Secondary | ICD-10-CM

## 2023-07-27 DIAGNOSIS — I4891 Unspecified atrial fibrillation: Secondary | ICD-10-CM | POA: Diagnosis present

## 2023-07-27 DIAGNOSIS — R0600 Dyspnea, unspecified: Secondary | ICD-10-CM | POA: Diagnosis not present

## 2023-07-27 DIAGNOSIS — E785 Hyperlipidemia, unspecified: Secondary | ICD-10-CM | POA: Insufficient documentation

## 2023-07-27 DIAGNOSIS — R5383 Other fatigue: Secondary | ICD-10-CM | POA: Diagnosis not present

## 2023-07-27 DIAGNOSIS — R0683 Snoring: Secondary | ICD-10-CM | POA: Insufficient documentation

## 2023-07-27 DIAGNOSIS — Z7901 Long term (current) use of anticoagulants: Secondary | ICD-10-CM | POA: Diagnosis not present

## 2023-07-27 DIAGNOSIS — Z6841 Body Mass Index (BMI) 40.0 and over, adult: Secondary | ICD-10-CM | POA: Insufficient documentation

## 2023-07-27 HISTORY — PX: CARDIOVERSION: EP1203

## 2023-07-27 LAB — POCT I-STAT, CHEM 8
BUN: 18 mg/dL (ref 8–23)
Calcium, Ion: 1.16 mmol/L (ref 1.15–1.40)
Chloride: 101 mmol/L (ref 98–111)
Creatinine, Ser: 1.2 mg/dL (ref 0.61–1.24)
Glucose, Bld: 98 mg/dL (ref 70–99)
HCT: 40 % (ref 39.0–52.0)
Hemoglobin: 13.6 g/dL (ref 13.0–17.0)
Potassium: 3.7 mmol/L (ref 3.5–5.1)
Sodium: 139 mmol/L (ref 135–145)
TCO2: 24 mmol/L (ref 22–32)

## 2023-07-27 LAB — GLUCOSE, CAPILLARY: Glucose-Capillary: 95 mg/dL (ref 70–99)

## 2023-07-27 SURGERY — CARDIOVERSION (CATH LAB)
Anesthesia: General

## 2023-07-27 MED ORDER — LIDOCAINE HCL (CARDIAC) PF 100 MG/5ML IV SOSY
PREFILLED_SYRINGE | INTRAVENOUS | Status: DC | PRN
Start: 2023-07-27 — End: 2023-07-27
  Administered 2023-07-27: 40 mg via INTRAVENOUS

## 2023-07-27 MED ORDER — PROPOFOL 10 MG/ML IV BOLUS
INTRAVENOUS | Status: DC | PRN
Start: 2023-07-27 — End: 2023-07-27
  Administered 2023-07-27: 30 mg via INTRAVENOUS
  Administered 2023-07-27: 100 mg via INTRAVENOUS

## 2023-07-27 MED ORDER — SODIUM CHLORIDE 0.9% FLUSH: 10.0000 mL | Freq: Two times a day (BID) | INTRAVENOUS | Status: DC

## 2023-07-27 MED ORDER — PHENYLEPHRINE HCL (PRESSORS) 10 MG/ML IV SOLN
INTRAVENOUS | Status: DC | PRN
  Administered 2023-07-27: 240 ug via INTRAVENOUS

## 2023-07-27 SURGICAL SUPPLY — 1 items: PAD DEFIB RADIO PHYSIO CONN (PAD) ×1

## 2023-07-27 NOTE — Anesthesia Preprocedure Evaluation (Addendum)
Anesthesia Evaluation  Patient identified by MRN, date of birth, ID band Patient awake    Reviewed: Allergy & Precautions, NPO status , Patient's Chart, lab work & pertinent test results  History of Anesthesia Complications Negative for: history of anesthetic complications  Airway Mallampati: I  TM Distance: >3 FB Neck ROM: Full    Dental  (+) Dental Advisory Given   Pulmonary sleep apnea (probable, no CPAP yet)    breath sounds clear to auscultation       Cardiovascular hypertension, Pt. on medications (-) angina + dysrhythmias Atrial Fibrillation  Rhythm:Irregular Rate:Normal  05/2023 ECHO: EF 50-55%, low normal LVF, mild LVH, normal RVF, severe LA dilation, mild MR, aortic valve sclerosis without stenosis   Neuro/Psych negative neurological ROS     GI/Hepatic negative GI ROS, Neg liver ROS,,,  Endo/Other  diabetesHypothyroidism  Class 4 obesitySemaglutide BMI 41.8  Renal/GU negative Renal ROS     Musculoskeletal  (+) Arthritis ,    Abdominal   Peds  Hematology eliquis   Anesthesia Other Findings   Reproductive/Obstetrics                             Anesthesia Physical Anesthesia Plan  ASA: 3  Anesthesia Plan: General   Post-op Pain Management: Minimal or no pain anticipated   Induction: Intravenous  PONV Risk Score and Plan: 2 and Treatment may vary due to age or medical condition  Airway Management Planned: Natural Airway and Nasal Cannula  Additional Equipment: None  Intra-op Plan:   Post-operative Plan:   Informed Consent: I have reviewed the patients History and Physical, chart, labs and discussed the procedure including the risks, benefits and alternatives for the proposed anesthesia with the patient or authorized representative who has indicated his/her understanding and acceptance.     Dental advisory given  Plan Discussed with: CRNA and Surgeon  Anesthesia  Plan Comments:        Anesthesia Quick Evaluation

## 2023-07-27 NOTE — Anesthesia Postprocedure Evaluation (Signed)
Anesthesia Post Note  Patient: Randy [name]  Procedure(s) Performed: CARDIOVERSION (CATH LAB)     Patient location during evaluation: Cath Lab Anesthesia Type: General Level of consciousness: awake and alert, patient cooperative and oriented Pain management: pain level controlled Vital Signs Assessment: post-procedure vital signs reviewed and stable Respiratory status: spontaneous breathing, nonlabored ventilation and respiratory function stable Cardiovascular status: blood pressure returned to baseline and stable Postop Assessment: no apparent nausea or vomiting Anesthetic complications: no   No notable events documented.  Last Vitals:  Vitals:   07/27/23 1026 07/27/23 1035  BP:  (!) 97/47  Pulse:  72  Resp:  18  Temp: 36.7 C   SpO2:  97%    Last Pain:  Vitals:   07/27/23 1026  TempSrc: Temporal  PainSc: 0-No pain                 Kassandra Meriweather,E. Evah Rashid

## 2023-07-27 NOTE — Transfer of Care (Signed)
Immediate Anesthesia Transfer of Care Note  Patient: Randy [name]  Procedure(s) Performed: CARDIOVERSION (CATH LAB)  Patient Location: PACU  Anesthesia Type:General  Level of Consciousness: awake and patient cooperative  Airway & Oxygen Therapy: Patient Spontanous Breathing and Patient connected to [name] mask oxygen  Post-op Assessment: Report given to RN and Post -op Vital signs reviewed and stable  Post vital signs: Reviewed and stable  Last Vitals:  Vitals Value Taken Time  BP    Temp    Pulse    Resp    SpO2      Last Pain:  Vitals:   07/27/23 0908  TempSrc:   PainSc: 0-No pain         Complications: No notable events documented.

## 2023-07-27 NOTE — Interval H&P Note (Signed)
History and Physical Interval Note:  07/27/2023 9:40 AM  Randy [name]  has presented today for surgery, with the diagnosis of afib.  The various methods of treatment have been discussed with the patient and family. After consideration of risks, benefits and other options for treatment, the patient has consented to  Procedure(s): CARDIOVERSION (CATH LAB) (N/A) as a surgical intervention.  The patient's history has been reviewed, patient examined, no change in status, stable for surgery.  I have reviewed the patient's chart and labs.  Questions were answered to the patient's satisfaction.    NPO for DCCV. On eliquis >3 weeks.   [name], MD, [facility]  [facility]  [address], [address] [place], [place] [address] [phone]  9:40 AM

## 2023-07-27 NOTE — CV Procedure (Signed)
   DIRECT CURRENT CARDIOVERSION  NAME:  Randy [name]    MRN: [id number] DOB:  1961/03/01    ADMIT DATE: 07/27/2023  Indication:  Symptomatic atrial fibrillation   Procedure Note:  The patient signed informed consent.  They have had had therapeutic anticoagulation with eliquis greater than 3 weeks.  Anesthesia was administered by Dr. Jean Rosenthal.  Adequate airway was maintained throughout and vital followed per protocol.  They were cardioverted x 2 with 200J of biphasic synchronized energy.  They converted to NSR. However, after each attempt, the patient quickly returned to Afib. No further attempts were made. There were no apparent complications.  The patient had normal neuro status and respiratory status post procedure with vitals stable as recorded elsewhere.    Follow up: They will continue on current medical therapy and follow up with cardiology as scheduled.  [name], MD, [facility]  [facility]  [address], [address] [place], [place] [address] [phone]  10:19 AM

## 2023-07-27 NOTE — Discharge Instructions (Signed)

## 2023-07-28 ENCOUNTER — Telehealth: Payer: Self-pay | Admitting: Internal Medicine

## 2023-07-28 ENCOUNTER — Ambulatory Visit: Payer: BC Managed Care – PPO | Attending: Internal Medicine

## 2023-07-28 ENCOUNTER — Encounter (HOSPITAL_COMMUNITY): Payer: Self-pay | Admitting: Cardiovascular Disease

## 2023-07-28 DIAGNOSIS — R0683 Snoring: Secondary | ICD-10-CM

## 2023-07-28 NOTE — Procedures (Signed)
SLEEP STUDY REPORT Patient Information Study Date: 07/23/2023 Patient Name: Randy [name] Patient ID: [id number] Birth Date: August 14, 1961 Age: 62 Gender: Male BMI: 41.8 (W=308 lb, H=6' 0'') Stopbang: 7 Referring Physician: Dietrich Pates, MD  TEST DESCRIPTION: Home sleep apnea testing was completed using the WatchPat, a Type 1 device, utilizing  peripheral arterial tonometry (PAT), chest movement, actigraphy, pulse oximetry, pulse rate, body position and snore.  AHI was calculated with apnea and hypopnea using valid sleep time as the denominator. RDI includes apneas,  hypopneas, and RERAs. The data acquired and the scoring of sleep and all associated events were performed in  accordance with the recommended standards and specifications as outlined in the AASM Manual for the Scoring of  Sleep and Associated Events 2.2.0 (2015).  FINDINGS:  1. Moderate Obstructive Sleep Apnea with AHI 17.5/hr.   2. No Central Sleep Apnea with pAHIc 3/hr.  3. Oxygen desaturations as low as 78%.  4. Moderate snoring was present. O2 sats were < 88% for 0.2 min.  5. Total sleep time was 5 hrs and 50 min.  6. 15.8% of total sleep time was spent in REM sleep.   7. Shortened sleep onset latency at 6 min  8. Shortened REM sleep onset latency at 34 min.   9. Total awakenings were 15.  10. Arrhythmia detection: Suggestive of possible brief atrial fibrillation lasting 2 hours 40 minutes and 17seconds. This  is not diagnostic and further testing with outpatient telemetry monitoring is recommended.  DIAGNOSIS:  Moderate Obstructive Sleep Apnea (G47.33) Possible Atrial Fibrillation  RECOMMENDATIONS: 1. Clinical correlation of these findings is necessary. The decision to treat obstructive sleep apnea (OSA) is usually  based on the presence of apnea symptoms or the presence of associated medical conditions such as Hypertension,  Congestive Heart Failure, Atrial Fibrillation or Obesity. The most common symptoms of OSA  are snoring, gasping for  breath while sleeping, daytime sleepiness and fatigue.   2. Initiating apnea therapy is recommended given the presence of symptoms and/or associated conditions.  Recommend proceeding with one of the following:   a. Auto-CPAP therapy with a pressure range of 5-20cm H2O.   b. An oral appliance (OA) that can be obtained from certain dentists with expertise in sleep medicine. These are  primarily of use in non-obese patients with mild and moderate disease.   c. An ENT consultation which may be useful to look for specific causes of obstruction and possible treatment  options.   d. If patient is intolerant to PAP therapy, consider referral to ENT for evaluation for hypoglossal nerve stimulator.   3. Close follow-up is necessary to ensure success with CPAP or oral appliance therapy for maximum benefit .  4. A follow-up oximetry study on CPAP is recommended to assess the adequacy of therapy and determine the need  for supplemental oxygen or the potential need for Bi-level therapy. An arterial blood gas to determine the adequacy of  baseline ventilation and oxygenation should also be considered.  5. Healthy sleep recommendations include: adequate nightly sleep (normal 7-9 hrs/night), avoidance of caffeine after  noon and alcohol near bedtime, and maintaining a sleep environment that is cool, dark and quiet.  6. Weight loss for overweight patients is recommended. Even modest amounts of weight loss can significantly  improve the severity of sleep apnea.  7. Snoring recommendations include: weight loss where appropriate, side sleeping, and avoidance of alcohol before  bed.  8. Operation of motor vehicle should not be performed when sleepy.  Signature:  Armanda Magic, MD; [facility]; Diplomat, American Board of Sleep  Medicine Electronically Signed: 07/28/2023 11:01:23 AM

## 2023-07-28 NOTE — Telephone Encounter (Signed)
  Per MyChart scheduling message:  Patient is requesting to share the below information with Dr Tenny Craw  1st Cardioversion completed but failed. They said you may change or add meds and then consider another cardioversion in 1- 2 months. 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 Thank you.

## 2023-07-28 NOTE — Telephone Encounter (Signed)
Left voicemail to return call to office.

## 2023-08-01 NOTE — Telephone Encounter (Addendum)
Pt is asking of he can go ahead and schedule his next Cardioversion in the time frame he noted.. 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   I explained to him that he needs an updated  OV prior to scheduling... he needs only late in the day:   I made him an appt for: 08/09/23 his day off.   He is also asking about being told after his last Cardioversion if he wanted to possibly try antiarrhythmics... he can further discuss the best options/plan at his OV.

## 2023-08-01 NOTE — Telephone Encounter (Signed)
Pt is asking to talk with Dr Mayford Knife nurse about his recent sleep study... he is asking if he can try an an "OTC" oral appliance to see if it will even work and if so, then pursue a more costly option with a dentist.. I have advised him that I will reach you to Dr Mayford Knife and her nurse for advice and plan.

## 2023-08-02 NOTE — Telephone Encounter (Signed)
Patient was returning call. Please advise ?

## 2023-08-02 NOTE — Telephone Encounter (Signed)
Called to patient and he asked can we call him back

## 2023-08-02 NOTE — Telephone Encounter (Signed)
Call to patient to advise that Dr. Mayford Knife recommends he start cpap due to severity of OSA and presence of afib, no answer and no DPR on file. Left message with no identifiers asking recipient to call [place] at our office #.

## 2023-08-06 NOTE — Progress Notes (Unsigned)
Cardiology Office Note:  .   Date:  08/09/2023  ID:  Randy [name], DOB [birth year], MRN [id number] PCP: Tresa Garter, MD  [facility] Providers Cardiologist:  Dietrich Pates, MD History of Present Illness: .   Randy [name] is a 62 y.o. male with a history of Atrial fib, HTN, HL, T2DM, hypothyroidism, morbid obesity   he is status post DCCV by Dr. Stephenie Acres on 07/27/2023, however after each attempt the patient quickly returned to atrial fibrillation and therefore no further attempts were made.  He was continued on medical therapy to include Eliquis.   He comes today frustrated that the cardioversion did not work and he would like to have it repeated.  He is also stating that his heart rate has been more elevated and he has been more tired.  He states that climbing his stairs to his apartment on the third floor really causes him to have worsening shortness of breath as of late.  He states that he is being fitted for CPAP and he is waiting on a phone call from sleep medicine to have this appointment set up.  He has lost over 60 pounds.  He denies any bleeding, excessive bruising, or hemoptysis on Eliquis.  ROS: As above otherwise negative  Studies Reviewed: .   Echocardiogram 05/20/2023  1. Left ventricular ejection fraction, by estimation, is 50 to 55%. The  left ventricle has low normal function. The left ventricle has no regional  wall motion abnormalities. There is mild asymmetric left ventricular  hypertrophy of the basal and septal  segments. Left ventricular diastolic parameters are indeterminate.   2. Right ventricular systolic function is normal. The right ventricular  size is normal.   3. Left atrial size was severely dilated.   4. Right atrial size was moderately dilated.   5. The mitral valve is abnormal. Mild mitral valve regurgitation. No  evidence of mitral stenosis.   6. The aortic valve is tricuspid. There is mild calcification of the  aortic valve. There is  mild thickening of the aortic valve. Aortic valve  regurgitation is not visualized. Aortic valve sclerosis is present, with  no evidence of aortic valve stenosis.   7. The inferior vena cava is normal in size with greater than 50%  respiratory variability, suggesting right atrial pressure of 3 mmHg.    EKG Interpretation Date/Time:  Wednesday August 09 2023 14:23:38 EST Ventricular Rate:  102 PR Interval:    QRS Duration:  112 QT Interval:  356 QTC Calculation: 463 R Axis:   -14  Text Interpretation: Atrial fibrillation with rapid ventricular response Minimal voltage criteria for LVH, may be normal variant ( R in aVL ) Nonspecific T wave abnormality When compared with ECG of 17-May-2023 16:25, PREVIOUS ECG IS PRESENT Confirmed by Joni Reining [phone]) on 08/09/2023 4:23:06 PM    Physical Exam:   VS:  BP (!) 106/58 (BP Location: Left Arm, Patient Position: Sitting, Cuff Size: Large)   Pulse (!) 102   Ht 6' (1.829 m)   Wt (!) 303 lb 12.8 oz (137.8 kg)   SpO2 94%   BMI 41.20 kg/m     Wt Readings from Last 3 Encounters:  08/09/23 (!) 303 lb 12.8 oz (137.8 kg)  07/27/23 (!) 308 lb (139.7 kg)  06/27/23 (!) 308 lb 9.6 oz (140 kg)    GEN: Well nourished, well developed in no acute distress NECK: No JVD; No carotid bruits CARDIAC: IRRR, tachycardic, no murmurs, rubs, gallops RESPIRATORY:  Clear to auscultation without rales, wheezing or rhonchi  ABDOMEN: Soft, non-tender, non-distended EXTREMITIES: Dependent bilateral edema; No deformity   ASSESSMENT AND PLAN: .    Persistent atrial fibrillation: He did not have a successful DCCV despite several attempts to return him to normal sinus rhythm.  I have discussed this with him and reviewed his echocardiogram including discussion of the enlarged left atrium causing resistance to return to normal sinus rhythm with DCCV, and OSA precipitating A-fib.  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  He will need to be referred to electrophysiology for discussion of ablation and/or antiarrhythmic therapy or both.  The patient is frustrated that he will need to be seen by another specialist.  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   I discussed adding amiodarone loading and discontinuing diltiazem as blood pressure was soft, with Dr. Antoine Poche, who is DOD.  It was his recommendation that amiodarone loading would take too long to be have any relief sooner as the patient requires.  Therefore we will start him on metoprolol 25 tartrate 25 mg twice daily, continue diltiazem.  He is in agreement with referral to EP.  He will continue Eliquis as directed.  The patient will follow-up with Dr. Tenny Craw on previously scheduled appointment on September 04, 2023.       He is planned to see Dr. Nelly Laurence on September 14, 2023.  He is also on a wait list in case there are cancellations.  He is in agreement with this plan.  He is advised that if his heart rate increases to over 120 bpm sustained with associated worsening breathing he will need to be seen in ED.  He verbalizes understanding      2.  Hypertension: Blood pressure is soft today.  Likely due to atrial fibrillation and decreased cardiac output from high heart rate.  Slowing his heart rate down may be helpful in normalizing blood pressure and his symptoms of fatigue.  3.  OSA: Is being fitted for CPAP.  Awaiting sleep medicine to call him to move forward with this.   Signed, Bettey Mare. Liborio Nixon, ANP, AACC

## 2023-08-07 ENCOUNTER — Telehealth: Payer: Self-pay | Admitting: *Deleted

## 2023-08-07 DIAGNOSIS — G4733 Obstructive sleep apnea (adult) (pediatric): Secondary | ICD-10-CM

## 2023-08-07 DIAGNOSIS — R0683 Snoring: Secondary | ICD-10-CM

## 2023-08-07 NOTE — Telephone Encounter (Signed)
Call to patient to advise that Dr. Mayford Knife does not think an oral appliance will work to alleviate his OSA given how many events he was having on his sleep study, especially in the setting of afib. Patient agrees to initiate cpap, requests that orders are sent to insurance/DME as quickly as possible as he thinks he will pay less for cpap if it's initiated when he has already met his deductible for the year. Information sent to sleep studies pool.

## 2023-08-07 NOTE — Telephone Encounter (Signed)
-----   Message from Armanda Magic sent at 07/28/2023 11:03 AM EST ----- Please let patient know that they have sleep apnea and recommend treating with CPAP.  Please order an auto CPAP from 4-15cm H2O with heated humidity and mask of choice.  Order overnight pulse ox on CPAP.  Followup with me in 6 weeks.

## 2023-08-07 NOTE — Telephone Encounter (Signed)
The patient has been notified of the result and verbalized understanding.  All questions (if any) were answered. Randy [name], CMA 08/07/2023 2:42 PM    Upon patient request DME selection is [facility] Patient understands he will be contacted by [facility] to set up his cpap. Patient understands to call if [facility] does not contact him with new setup in a timely manner. Patient understands they will be called once confirmation has been received from [facility] that they have received their new machine to schedule 10 week follow up appointment.   [facility] notified of new cpap order  Please add to airview Patient was grateful for the call and thanked me.

## 2023-08-08 NOTE — Telephone Encounter (Signed)
CPAP orders submitted to [facility] on 08/07/23. See phone note from 08/08/23.

## 2023-08-09 ENCOUNTER — Encounter: Payer: Self-pay | Admitting: Adult Health

## 2023-08-09 ENCOUNTER — Ambulatory Visit: Payer: BC Managed Care – PPO | Attending: Adult Health | Admitting: Adult Health

## 2023-08-09 VITALS — BP 106/58 | HR 102 | Ht 72.0 in | Wt 303.8 lb

## 2023-08-09 DIAGNOSIS — I480 Paroxysmal atrial fibrillation: Secondary | ICD-10-CM

## 2023-08-09 DIAGNOSIS — I10 Essential (primary) hypertension: Secondary | ICD-10-CM

## 2023-08-09 MED ORDER — METOPROLOL TARTRATE 25 MG PO TABS
25.0000 mg | ORAL_TABLET | Freq: Two times a day (BID) | ORAL | 3 refills | Status: DC
Start: 2023-08-09 — End: 2024-07-30

## 2023-08-09 NOTE — Patient Instructions (Signed)
Medication Instructions:  Start Metoprolol Tartrate 25 mg ( Take 1 Tablet Daily). *If you need a refill on your cardiac medications before your next appointment, please call your pharmacy*   Lab Work: No Labs If you have labs (blood work) drawn today and your tests are completely normal, you will receive your results only by: MyChart Message (if you have MyChart) OR A paper copy in the mail If you have any lab test that is abnormal or we need to change your treatment, we will call you to review the results.   Testing/Procedures: No Testing   Follow-Up: At [facility], you and your health needs are our priority.  As part of our continuing mission to provide you with exceptional heart care, we have created designated Provider Care Teams.  These Care Teams include your primary Cardiologist (physician) and Advanced Practice Providers (APPs -  Physician Assistants and Nurse Practitioners) who all work together to provide you with the care you need, when you need it.  We recommend signing up for the patient portal called "MyChart".  Sign up information is provided on this After Visit Summary.  MyChart is used to connect with patients for Virtual Visits (Telemedicine).  Patients are able to view lab/test results, encounter notes, upcoming appointments, etc.  Non-urgent messages can be sent to your provider as well.   To learn more about what you can do with MyChart, go to [email or web address].    Your next appointment:   Keep Scheduled Appointment  Provider:   Dietrich Pates, MD

## 2023-08-14 ENCOUNTER — Encounter: Payer: Self-pay | Admitting: Internal Medicine

## 2023-08-15 ENCOUNTER — Encounter: Payer: Self-pay | Admitting: *Deleted

## 2023-08-15 ENCOUNTER — Encounter: Payer: Self-pay | Admitting: Cardiology

## 2023-08-15 ENCOUNTER — Ambulatory Visit: Payer: BC Managed Care – PPO | Attending: Cardiology | Admitting: Cardiology

## 2023-08-15 VITALS — BP 110/66 | HR 56 | Ht 72.0 in | Wt 305.8 lb

## 2023-08-15 DIAGNOSIS — Z01812 Encounter for preprocedural laboratory examination: Secondary | ICD-10-CM | POA: Diagnosis not present

## 2023-08-15 DIAGNOSIS — I480 Paroxysmal atrial fibrillation: Secondary | ICD-10-CM

## 2023-08-15 NOTE — H&P (View-Only) (Signed)
 Electrophysiology Office Note:   Date:  08/15/2023  ID:  Randy [name], DOB 06-15-1961, MRN [id number]  Primary Cardiologist: Dietrich Pates, MD Electrophysiologist: Regan Lemming, MD      History of Present Illness:   Randy [name] is a 62 y.o. male with h/o atrial fibrillation, hypertension, hyperlipidemia, diabetes, hypothyroidism, morbid obesity seen today for  for Electrophysiology evaluation of atrial fibrillation at the request of Joni Reining.    He is post cardioversion 07/27/2023.  After each attempt, he quickly went back into atrial fibrillation.  He is currently on Eliquis.  He has noted elevations in his heart rate as well as fatigue.  He gets fatigued when climbing stairs to his third floor apartment which is also associated with shortness of breath.  He has sleep apnea and is being fitted for CPAP.  He is lost over 60 pounds.  Review of systems complete and found to be negative unless listed in HPI.   EP Information / Studies Reviewed:    EKG is not ordered today. EKG from 08/09/23 reviewed which showed atrial fibrillation, rate 102        Risk Assessment/Calculations:    CHA2DS2-VASc Score = 2   This indicates a 2.2% annual risk of stroke. The patient's score is based upon: CHF History: 0 HTN History: 1 Diabetes History: 1 Stroke History: 0 Vascular Disease History: 0 Age Score: 0 Gender Score: 0        STOP-Bang Score:  7       Physical Exam:   VS:  BP 110/66 (BP Location: Right Arm, Patient Position: Sitting, Cuff Size: Large)   Pulse (!) 56   Ht 6' (1.829 m)   Wt (!) 305 lb 12.8 oz (138.7 kg)   SpO2 96%   BMI 41.47 kg/m    Wt Readings from Last 3 Encounters:  08/15/23 (!) 305 lb 12.8 oz (138.7 kg)  08/09/23 (!) 303 lb 12.8 oz (137.8 kg)  07/27/23 (!) 308 lb (139.7 kg)     GEN: Well nourished, well developed in no acute distress NECK: No JVD; No carotid bruits CARDIAC: Irregularly irregular rate and rhythm, no murmurs, rubs,  gallops RESPIRATORY:  Clear to auscultation without rales, wheezing or rhonchi  ABDOMEN: Soft, non-tender, non-distended EXTREMITIES:  No edema; No deformity   ASSESSMENT AND PLAN:    1.  Persistent atrial fibrillation: Post cardioversion with several attempts and quickly went back into atrial fibrillation.  He would like get back into normal rhythm.  He would prefer ablation over medication therapy due to his age and other medical issues.  Risk and benefits have been discussed.  He understands the risks and is agreed to the procedure.  Risk, benefits, and alternatives to EP study and radiofrequency/pulse field ablation for afib were also discussed in detail today. These risks include but are not limited to stroke, bleeding, vascular damage, tamponade, perforation, damage to the esophagus, lungs, and other structures, pulmonary vein stenosis, worsening renal function, and death. The patient understands these risk and wishes to proceed.  We Randy [name] therefore proceed with catheter ablation at the next available time.  Carto, ICE, anesthesia are requested for the procedure.  Randy [name] also obtain CT PV protocol prior to the procedure to exclude LAA thrombus and further evaluate atrial anatomy.  2.  Hypertension: Currently well-controlled  3.  Obstructive sleep apnea: Being fitted for CPAP.  Compliance encouraged.  4.  Secondary hypercoagulable state: Currently on Eliquis for atrial fibrillation  Follow up with Dr. Elberta Fortis as  usual post procedure  Signed, Randy [name] Randy Loa, MD

## 2023-08-15 NOTE — Patient Instructions (Addendum)
Medication Instructions:  Your physician recommends that you continue on your current medications as directed. Please refer to the Current Medication list given to you today.  *If you need a refill on your cardiac medications before your next appointment, please call your pharmacy*   Lab Work: Pre procedure labs today:  BMP & CBC  If you have a lab test that is abnormal and we need to change your treatment, we will call you to review the results -- otherwise no news is good news.    Testing/Procedures: Your physician has requested that you have cardiac CT 1 month PRIOR to your ablation. Cardiac computed tomography (CT) is a painless test that uses an x-ray machine to take clear, detailed pictures of your heart.  Please see instruction letter given to you today.  You CT is tomorrow  Your physician has recommended that you have an ablation. Catheter ablation is a medical procedure used to treat some cardiac arrhythmias (irregular heartbeats). During catheter ablation, a long, thin, flexible tube is put into a blood vessel in your groin (upper thigh), or neck. This tube is called an ablation catheter. It is then guided to your heart through the blood vessel. Radio frequency waves destroy small areas of heart tissue where abnormal heartbeats may cause an arrhythmia to start.   Your ablation is scheduled for 09/07/2023. Please arrive at [facility] at 10:30 am.  We will call you for further instructions.   Follow-Up: At [facility], you and your health needs are our priority.  As part of our continuing mission to provide you with exceptional heart care, we have created designated Provider Care Teams.  These Care Teams include your primary Cardiologist (physician) and Advanced Practice Providers (APPs -  Physician Assistants and Nurse Practitioners) who all work together to provide you with the care you need, when you need it.  Your next appointment:   1 month(s) after your  ablation  The format for your next appointment:   In Person  Provider:   AFib clinic   Thank you for choosing [facility]!!   Dory Horn, RN [phone]    Other Instructions   Cardiac Ablation Cardiac ablation is a procedure to destroy (ablate) some heart tissue that is sending bad signals. These bad signals cause problems in heart rhythm. The heart has many areas that make these signals. If there are problems in these areas, they can make the heart beat in a way that is not normal. Destroying some tissues can help make the heart rhythm normal. Tell your doctor about: Any allergies you have. All medicines you are taking. These include vitamins, herbs, eye drops, creams, and over-the-counter medicines. Any problems you or family members have had with medicines that make you fall asleep (anesthetics). Any blood disorders you have. Any surgeries you have had. Any medical conditions you have, such as kidney failure. Whether you are pregnant or may be pregnant. What are the risks? This is a safe procedure. But problems may occur, including: Infection. Bruising and bleeding. Bleeding into the chest. Stroke or blood clots. Damage to nearby areas of your body. Allergies to medicines or dyes. The need for a pacemaker if the normal system is damaged. Failure of the procedure to treat the problem. What happens before the procedure? Medicines Ask your doctor about: Changing or stopping your normal medicines. This is important. Taking aspirin and ibuprofen. Do not take these medicines unless your doctor tells you to take them. Taking other medicines, vitamins, herbs,  and supplements. General instructions Follow instructions from your doctor about what you cannot eat or drink. Plan to have someone take you home from the hospital or clinic. If you will be going home right after the procedure, plan to have someone with you for 24 hours. Ask your doctor what steps will be  taken to prevent infection. What happens during the procedure?  An IV tube will be put into one of your veins. You will be given a medicine to help you relax. The skin on your neck or groin will be numbed. A cut (incision) will be made in your neck or groin. A needle will be put through your cut and into a large vein. A tube (catheter) will be put into the needle. The tube will be moved to your heart. Dye may be put through the tube. This helps your doctor see your heart. Small devices (electrodes) on the tube will send out signals. A type of energy will be used to destroy some heart tissue. The tube will be taken out. Pressure will be held on your cut. This helps stop bleeding. A bandage will be put over your cut. The exact procedure may vary among doctors and hospitals. What happens after the procedure? You will be watched until you leave the hospital or clinic. This includes checking your heart rate, breathing rate, oxygen, and blood pressure. Your cut will be watched for bleeding. You will need to lie still for a few hours. Do not drive for 24 hours or as long as your doctor tells you. Summary Cardiac ablation is a procedure to destroy some heart tissue. This is done to treat heart rhythm problems. Tell your doctor about any medical conditions you may have. Tell him or her about all medicines you are taking to treat them. This is a safe procedure. But problems may occur. These include infection, bruising, bleeding, and damage to nearby areas of your body. Follow what your doctor tells you about food and drink. You may also be told to change or stop some of your medicines. After the procedure, do not drive for 24 hours or as long as your doctor tells you. This information is not intended to replace advice given to you by your health care provider. Make sure you discuss any questions you have with your health care provider. Document Revised: 11/19/2021 Document Reviewed:  08/01/2019 Elsevier Patient Education  2023 Elsevier Inc.   Cardiac Ablation, Care After  This sheet gives you information about how to care for yourself after your procedure. Your health care provider may also give you more specific instructions. If you have problems or questions, contact your health care provider. What can I expect after the procedure? After the procedure, it is common to have: Bruising around your puncture site. Tenderness around your puncture site. Skipped heartbeats. If you had an atrial fibrillation ablation, you may have atrial fibrillation during the first several months after your procedure.  Tiredness (fatigue).  Follow these instructions at home: Puncture site care  Follow instructions from your health care provider about how to take care of your puncture site. Make sure you: If present, leave stitches (sutures), skin glue, or adhesive strips in place. These skin closures may need to stay in place for up to 2 weeks. If adhesive strip edges start to loosen and curl up, you may trim the loose edges. Do not remove adhesive strips completely unless your health care provider tells you to do that. If a large square bandage is present,  this may be removed 24 hours after surgery.  Check your puncture site every day for signs of infection. Check for: Redness, swelling, or pain. Fluid or blood. If your puncture site starts to bleed, lie down on your back, apply firm pressure to the area, and contact your health care provider. Warmth. Pus or a bad smell. A pea or small marble sized lump at the site is normal and can take up to three months to resolve.  Driving Do not drive for at least 4 days after your procedure or however long your health care provider recommends. (Do not resume driving if you have previously been instructed not to drive for other health reasons.) Do not drive or use heavy machinery while taking prescription pain medicine. Activity Avoid activities  that take a lot of effort for at least 7 days after your procedure. Do not lift anything that is heavier than 5 lb (4.5 kg) for one week.  No sexual activity for 1 week.  Return to your normal activities as told by your health care provider. Ask your health care provider what activities are safe for you. General instructions Take over-the-counter and prescription medicines only as told by your health care provider. Do not use any products that contain nicotine or tobacco, such as cigarettes and e-cigarettes. If you need help quitting, ask your health care provider. You may shower after 24 hours, but Do not take baths, swim, or use a hot tub for 1 week.  Do not drink alcohol for 24 hours after your procedure. Keep all follow-up visits as told by your health care provider. This is important. Contact a health care provider if: You have redness, mild swelling, or pain around your puncture site. You have fluid or blood coming from your puncture site that stops after applying firm pressure to the area. Your puncture site feels warm to the touch. You have pus or a bad smell coming from your puncture site. You have a fever. You have chest pain or discomfort that spreads to your neck, jaw, or arm. You have chest pain that is worse with lying on your back or taking a deep breath. You are sweating a lot. You feel nauseous. You have a fast or irregular heartbeat. You have shortness of breath. You are dizzy or light-headed and feel the need to lie down. You have pain or numbness in the arm or leg closest to your puncture site. Get help right away if: Your puncture site suddenly swells. Your puncture site is bleeding and the bleeding does not stop after applying firm pressure to the area. These symptoms may represent a serious problem that is an emergency. Do not wait to see if the symptoms will go away. Get medical help right away. Call your local emergency services (911 in the [place] Do not drive  yourself to the hospital. Summary After the procedure, it is normal to have bruising and tenderness at the puncture site in your groin, neck, or forearm. Check your puncture site every day for signs of infection. Get help right away if your puncture site is bleeding and the bleeding does not stop after applying firm pressure to the area. This is a medical emergency. This information is not intended to replace advice given to you by your health care provider. Make sure you discuss any questions you have with your health care provider.

## 2023-08-15 NOTE — Progress Notes (Signed)
Electrophysiology Office Note:   Date:  08/15/2023  ID:  Randy [name], DOB 06-15-1961, MRN [id number]  Primary Cardiologist: Dietrich Pates, MD Electrophysiologist: Regan Lemming, MD      History of Present Illness:   Randy [name] is a 62 y.o. male with h/o atrial fibrillation, hypertension, hyperlipidemia, diabetes, hypothyroidism, morbid obesity seen today for  for Electrophysiology evaluation of atrial fibrillation at the request of Joni Reining.    He is post cardioversion 07/27/2023.  After each attempt, he quickly went back into atrial fibrillation.  He is currently on Eliquis.  He has noted elevations in his heart rate as well as fatigue.  He gets fatigued when climbing stairs to his third floor apartment which is also associated with shortness of breath.  He has sleep apnea and is being fitted for CPAP.  He is lost over 60 pounds.  Review of systems complete and found to be negative unless listed in HPI.   EP Information / Studies Reviewed:    EKG is not ordered today. EKG from 08/09/23 reviewed which showed atrial fibrillation, rate 102        Risk Assessment/Calculations:    CHA2DS2-VASc Score = 2   This indicates a 2.2% annual risk of stroke. The patient's score is based upon: CHF History: 0 HTN History: 1 Diabetes History: 1 Stroke History: 0 Vascular Disease History: 0 Age Score: 0 Gender Score: 0        STOP-Bang Score:  7       Physical Exam:   VS:  BP 110/66 (BP Location: Right Arm, Patient Position: Sitting, Cuff Size: Large)   Pulse (!) 56   Ht 6' (1.829 m)   Wt (!) 305 lb 12.8 oz (138.7 kg)   SpO2 96%   BMI 41.47 kg/m    Wt Readings from Last 3 Encounters:  08/15/23 (!) 305 lb 12.8 oz (138.7 kg)  08/09/23 (!) 303 lb 12.8 oz (137.8 kg)  07/27/23 (!) 308 lb (139.7 kg)     GEN: Well nourished, well developed in no acute distress NECK: No JVD; No carotid bruits CARDIAC: Irregularly irregular rate and rhythm, no murmurs, rubs,  gallops RESPIRATORY:  Clear to auscultation without rales, wheezing or rhonchi  ABDOMEN: Soft, non-tender, non-distended EXTREMITIES:  No edema; No deformity   ASSESSMENT AND PLAN:    1.  Persistent atrial fibrillation: Post cardioversion with several attempts and quickly went back into atrial fibrillation.  He would like get back into normal rhythm.  He would prefer ablation over medication therapy due to his age and other medical issues.  Risk and benefits have been discussed.  He understands the risks and is agreed to the procedure.  Risk, benefits, and alternatives to EP study and radiofrequency/pulse field ablation for afib were also discussed in detail today. These risks include but are not limited to stroke, bleeding, vascular damage, tamponade, perforation, damage to the esophagus, lungs, and other structures, pulmonary vein stenosis, worsening renal function, and death. The patient understands these risk and wishes to proceed.  We Randy [name] therefore proceed with catheter ablation at the next available time.  Carto, ICE, anesthesia are requested for the procedure.  Randy [name] also obtain CT PV protocol prior to the procedure to exclude LAA thrombus and further evaluate atrial anatomy.  2.  Hypertension: Currently well-controlled  3.  Obstructive sleep apnea: Being fitted for CPAP.  Compliance encouraged.  4.  Secondary hypercoagulable state: Currently on Eliquis for atrial fibrillation  Follow up with Dr. Elberta Fortis as  usual post procedure  Signed, Randy Rishel [name], MD

## 2023-08-16 ENCOUNTER — Ambulatory Visit (HOSPITAL_COMMUNITY)
Admission: RE | Admit: 2023-08-16 | Discharge: 2023-08-16 | Disposition: A | Discharge status: Home / Self Care | Payer: BC Managed Care – PPO | Source: Ambulatory Visit | Attending: Internal Medicine | Admitting: Internal Medicine

## 2023-08-16 DIAGNOSIS — I480 Paroxysmal atrial fibrillation: Secondary | ICD-10-CM

## 2023-08-16 LAB — CBC
Hematocrit: 42.7 % (ref 37.5–51.0)
Hemoglobin: 14 g/dL (ref 13.0–17.7)
MCH: 30.1 pg (ref 26.6–33.0)
MCHC: 32.8 g/dL (ref 31.5–35.7)
MCV: 92 fL (ref 79–97)
Platelets: 246 10*3/uL (ref 150–450)
RBC: 4.65 x10E6/uL (ref 4.14–5.80)
RDW: 13 % (ref 11.6–15.4)
WBC: 5.7 10*3/uL (ref 3.4–10.8)

## 2023-08-16 LAB — BASIC METABOLIC PANEL
BUN/Creatinine Ratio: 15 (ref 10–24)
BUN: 20 mg/dL (ref 8–27)
CO2: 23 mmol/L (ref 20–29)
Calcium: 9.4 mg/dL (ref 8.6–10.2)
Chloride: 102 mmol/L (ref 96–106)
Creatinine, Ser: 1.3 mg/dL — ABNORMAL HIGH (ref 0.76–1.27)
Glucose: 86 mg/dL (ref 70–99)
Potassium: 4.2 mmol/L (ref 3.5–5.2)
Sodium: 141 mmol/L (ref 134–144)
eGFR: 62 mL/min/{1.73_m2} (ref >59)

## 2023-08-16 MED ORDER — IOHEXOL 350 MG/ML SOLN
95.0000 mL | Freq: Once | INTRAVENOUS | Status: AC | PRN
  Administered 2023-08-16: 95 mL via INTRAVENOUS

## 2023-08-23 NOTE — Telephone Encounter (Signed)
Fax came today from [facility] that the patient could not be reached for cpap set up. I reached out to him and he will return their call to get set up.

## 2023-09-04 ENCOUNTER — Encounter: Payer: Self-pay | Admitting: General Practice

## 2023-09-04 ENCOUNTER — Encounter: Payer: Self-pay | Admitting: Internal Medicine

## 2023-09-04 ENCOUNTER — Ambulatory Visit: Payer: BC Managed Care – PPO | Attending: Internal Medicine | Admitting: Internal Medicine

## 2023-09-04 VITALS — BP 122/68 | HR 95 | Ht 72.0 in | Wt 307.8 lb

## 2023-09-04 DIAGNOSIS — I10 Essential (primary) hypertension: Secondary | ICD-10-CM | POA: Diagnosis not present

## 2023-09-04 DIAGNOSIS — I4891 Unspecified atrial fibrillation: Secondary | ICD-10-CM

## 2023-09-04 NOTE — Pre-Procedure Instructions (Signed)
Instructed patient on the following items: Arrival time 1000 Nothing to eat or drink after midnight No meds AM of procedure Responsible person to drive you home and stay with you for 24 hrs  Have you missed any doses of anti-coagulant Eliquis- takes twice a day, hasn't missed any doses.  Don't take dose on am of procedure.

## 2023-09-04 NOTE — Progress Notes (Unsigned)
Electrophysiology Office Note:   Date:  09/04/2023  ID:  Randy [name], DOB Feb 15, 1961, MRN [id number]  Primary Cardiologist: Dietrich Pates, MD Electrophysiologist: Regan Lemming, MD      History of Present Illness:   Randy [name] is a 62 y.o. male with h/o atrial fibrillation, hypertension, hyperlipidemia, diabetes, hypothyroidism, morbid obesity seen today for  for Electrophysiology evaluation of atrial fibrillation at the request of Joni Reining.    He is post cardioversion 07/27/2023.  After each attempt, he quickly went back into atrial fibrillation.  He is currently on Eliquis.  He has noted elevations in his heart rate as well as fatigue.  He gets fatigued when climbing stairs to his third floor apartment which is also associated with shortness of breath.  He has sleep apnea and is being fitted for CPAP.  He is lost over 60 pounds.  Review of systems complete and found to be negative unless listed in HPI.   EP Information / Studies Reviewed:    EKG is not ordered today. EKG from 08/09/23 reviewed which showed atrial fibrillation, rate 102        Risk Assessment/Calculations:    CHA2DS2-VASc Score = 2   This indicates a 2.2% annual risk of stroke. The patient's score is based upon: CHF History: 0 HTN History: 1 Diabetes History: 1 Stroke History: 0 Vascular Disease History: 0 Age Score: 0 Gender Score: 0   {This patient has a significant risk of stroke if diagnosed with atrial fibrillation.  Please consider VKA or DOAC agent for anticoagulation if the bleeding risk is acceptable.   You can also use the SmartPhrase .[email or web address] for documentation.   :[id number]} No BP recorded.  {Refresh Note OR Click here to enter BP  :1}***   STOP-Bang Score:  7  { Consider Dx Sleep Disordered Breathing or Sleep Apnea  ICD G47.33          :1}     Physical Exam:   VS:  There were no vitals taken for this visit.   Wt Readings from Last 3 Encounters:  08/15/23 (!) 305 lb 12.8  oz (138.7 kg)  08/09/23 (!) 303 lb 12.8 oz (137.8 kg)  07/27/23 (!) 308 lb (139.7 kg)     GEN: Well nourished, well developed in no acute distress NECK: No JVD; No carotid bruits CARDIAC: Irregularly irregular rate and rhythm, no murmurs, rubs, gallops RESPIRATORY:  Clear to auscultation without rales, wheezing or rhonchi  ABDOMEN: Soft, non-tender, non-distended EXTREMITIES:  No edema; No deformity   ASSESSMENT AND PLAN:    1.  Persistent atrial fibrillation: Post cardioversion with several attempts and quickly went back into atrial fibrillation.  He would like get back into normal rhythm.  He would prefer ablation over medication therapy due to his age and other medical issues.  Risk and benefits have been discussed.  He understands the risks and is agreed to the procedure.  Risk, benefits, and alternatives to EP study and radiofrequency/pulse field ablation for afib were also discussed in detail today. These risks include but are not limited to stroke, bleeding, vascular damage, tamponade, perforation, damage to the esophagus, lungs, and other structures, pulmonary vein stenosis, worsening renal function, and death. The patient understands these risk and wishes to proceed.  We will therefore proceed with catheter ablation at the next available time.  Carto, ICE, anesthesia are requested for the procedure.  Will also obtain CT PV protocol prior to the procedure to exclude LAA thrombus and further evaluate atrial  anatomy.  2.  Hypertension: Currently well-controlled  3.  Obstructive sleep apnea: Being fitted for CPAP.  Compliance encouraged.  4.  Secondary hypercoagulable state: Currently on Eliquis for atrial fibrillation  Follow up with Dr. Elberta Fortis as usual post procedure  Signed, Dietrich Pates, MD

## 2023-09-04 NOTE — Patient Instructions (Signed)
Medication Instructions:  Your physician recommends that you continue on your current medications as directed. Please refer to the Current Medication list given to you today.  *If you need a refill on your cardiac medications before your next appointment, please call your pharmacy*  Lab Work: TODAY: Lipomed, LP(a), Apo B, BMET If you have labs (blood work) drawn today and your tests are completely normal, you will receive your results only by: MyChart Message (if you have MyChart) OR A paper copy in the mail If you have any lab test that is abnormal or we need to change your treatment, we will call you to review the results.  Testing/Procedures: Your physician has requested that you have a cardiac PET CT scan performed. This is performed at [facility]. A staff member will call  you to schedule an appointment to complete this test. Please see detailed instructions below in Other Instructions.  Follow-Up: At [facility], you and your health needs are our priority.  As part of our continuing mission to provide you with exceptional heart care, we have created designated Provider Care Teams.  These Care Teams include your primary Cardiologist (physician) and Advanced Practice Providers (APPs -  Physician Assistants and Nurse Practitioners) who all work together to provide you with the care you need, when you need it.  Your next appointment:   Will be determined pending cardiac PET CT  The format for your next appointment:   In Person  Provider:   Dietrich Pates, MD {  Other Instructions    Please report to Radiology at the [facility] Main Entrance 30 minutes early for your test.  [address] [place], [place] [address]                         OR   Please report to Radiology at [facility] Main Entrance, medical mall, 30 mins prior to your test.  [address]  [place], [place]  [phone]  How to Prepare for Your Cardiac PET/CT  Stress Test:  Nothing to eat or drink, except water, 3 hours prior to arrival time.  NO caffeine/decaffeinated products, or chocolate 12 hours prior to arrival. (Please note decaffeinated beverages (teas/coffees) still contain caffeine).  If you have caffeine within 12 hours prior, the test will need to be rescheduled.  Medication instructions: Do not take erectile dysfunction medications for 72 hours prior to test (sildenafil, tadalafil) Do not take nitrates (isosorbide mononitrate, Ranexa) the day before or day of test Do not take tamsulosin the day before or morning of test Hold theophylline containing medications for 12 hours. Hold Dipyridamole 48 hours prior to the test.  Diabetic Preparation: If able to eat breakfast prior to 3 hour fasting, you may take all medications, including your insulin. Do not worry if you miss your breakfast dose of insulin - start at your next meal. If you do not eat prior to 3 hour fast-Hold all diabetes (oral and insulin) medications. Patients who wear a continuous glucose monitor MUST remove the device prior to scanning.  You may take your remaining medications with water.  NO perfume, cologne or lotion on chest or abdomen area. FEMALES - Please avoid wearing dresses to this appointment.  Total time is 1 to 2 hours; you may want to bring reading material for the waiting time.  IF YOU THINK YOU MAY BE PREGNANT, OR ARE NURSING PLEASE INFORM THE TECHNOLOGIST.  In preparation for your  appointment, medication and supplies will be purchased.  Appointment availability is limited, so if you need to cancel or reschedule, please call the Radiology Department at [phone] [facility]) OR [phone] [facility]) 24 hours in advance to avoid a cancellation fee of $100.00  What to Expect When you Arrive:  Once you arrive and check in for your appointment, you will be taken to a preparation room within the Radiology Department.  A technologist or Nurse will obtain  your medical history, verify that you are correctly prepped for the exam, and explain the procedure.  Afterwards, an IV will be started in your arm and electrodes will be placed on your skin for EKG monitoring during the stress portion of the exam. Then you will be escorted to the PET/CT scanner.  There, staff will get you positioned on the scanner and obtain a blood pressure and EKG.  During the exam, you will continue to be connected to the EKG and blood pressure machines.  A small, safe amount of a radioactive tracer will be injected in your IV to obtain a series of pictures of your heart along with an injection of a stress agent.    After your Exam:  It is recommended that you eat a meal and drink a caffeinated beverage to counter act any effects of the stress agent.  Drink plenty of fluids for the remainder of the day and urinate frequently for the first couple of hours after the exam.  Your doctor will inform you of your test results within 7-10 business days.  For more information and frequently asked questions, please visit our website: [email or web address]  For questions about your test or how to prepare for your test, please call: Cardiac Imaging Nurse Navigators Office: [phone]

## 2023-09-07 ENCOUNTER — Ambulatory Visit (HOSPITAL_COMMUNITY): Payer: BC Managed Care – PPO | Admitting: Registered Nurse

## 2023-09-07 ENCOUNTER — Other Ambulatory Visit: Payer: Self-pay

## 2023-09-07 ENCOUNTER — Ambulatory Visit (HOSPITAL_COMMUNITY)
Admission: RE | Admit: 2023-09-07 | Discharge: 2023-09-07 | Disposition: A | Discharge status: Home / Self Care | Payer: BC Managed Care – PPO | Attending: Cardiology | Admitting: Cardiology

## 2023-09-07 ENCOUNTER — Encounter (HOSPITAL_COMMUNITY)
Admission: RE | Disposition: A | Discharge status: Home / Self Care | Payer: Self-pay | Source: Home / Self Care | Attending: Cardiology

## 2023-09-07 DIAGNOSIS — D6869 Other thrombophilia: Secondary | ICD-10-CM | POA: Insufficient documentation

## 2023-09-07 DIAGNOSIS — G4733 Obstructive sleep apnea (adult) (pediatric): Secondary | ICD-10-CM | POA: Insufficient documentation

## 2023-09-07 DIAGNOSIS — I10 Essential (primary) hypertension: Secondary | ICD-10-CM | POA: Diagnosis not present

## 2023-09-07 DIAGNOSIS — Z6841 Body Mass Index (BMI) 40.0 and over, adult: Secondary | ICD-10-CM | POA: Diagnosis not present

## 2023-09-07 DIAGNOSIS — Z7984 Long term (current) use of oral hypoglycemic drugs: Secondary | ICD-10-CM | POA: Insufficient documentation

## 2023-09-07 DIAGNOSIS — Z7901 Long term (current) use of anticoagulants: Secondary | ICD-10-CM | POA: Insufficient documentation

## 2023-09-07 DIAGNOSIS — E785 Hyperlipidemia, unspecified: Secondary | ICD-10-CM | POA: Insufficient documentation

## 2023-09-07 DIAGNOSIS — I4891 Unspecified atrial fibrillation: Secondary | ICD-10-CM

## 2023-09-07 DIAGNOSIS — E039 Hypothyroidism, unspecified: Secondary | ICD-10-CM | POA: Diagnosis not present

## 2023-09-07 DIAGNOSIS — I4892 Unspecified atrial flutter: Secondary | ICD-10-CM

## 2023-09-07 DIAGNOSIS — Z79899 Other long term (current) drug therapy: Secondary | ICD-10-CM | POA: Insufficient documentation

## 2023-09-07 DIAGNOSIS — E119 Type 2 diabetes mellitus without complications: Secondary | ICD-10-CM | POA: Insufficient documentation

## 2023-09-07 DIAGNOSIS — I4819 Other persistent atrial fibrillation: Secondary | ICD-10-CM | POA: Insufficient documentation

## 2023-09-07 HISTORY — PX: ATRIAL FIBRILLATION ABLATION: EP1191

## 2023-09-07 LAB — NMR, LIPOPROFILE
Cholesterol, Total: 102 mg/dL (ref 100–199)
HDL Particle Number: 28.1 umol/L — ABNORMAL LOW (ref >=30.5)
HDL-C: 35 mg/dL — ABNORMAL LOW (ref >39)
LDL Particle Number: 715 nmol/L (ref <1000)
LDL Size: 20 nmol — ABNORMAL LOW (ref >20.5)
LDL-C (NIH Calc): 52 mg/dL (ref 0–99)
LP-IR Score: 52 — ABNORMAL HIGH (ref <=45)
Small LDL Particle Number: 478 nmol/L (ref <=527)
Triglycerides: 70 mg/dL (ref 0–149)

## 2023-09-07 LAB — BASIC METABOLIC PANEL
BUN/Creatinine Ratio: 16 (ref 10–24)
BUN: 19 mg/dL (ref 8–27)
CO2: 22 mmol/L (ref 20–29)
Calcium: 9.2 mg/dL (ref 8.6–10.2)
Chloride: 103 mmol/L (ref 96–106)
Creatinine, Ser: 1.16 mg/dL (ref 0.76–1.27)
Glucose: 95 mg/dL (ref 70–99)
Potassium: 4.1 mmol/L (ref 3.5–5.2)
Sodium: 140 mmol/L (ref 134–144)
eGFR: 71 mL/min/{1.73_m2} (ref >59)

## 2023-09-07 LAB — POCT ACTIVATED CLOTTING TIME: Activated Clotting Time: 262 s

## 2023-09-07 LAB — LIPOPROTEIN A (LPA): Lipoprotein (a): 102.6 nmol/L — ABNORMAL HIGH (ref <75.0)

## 2023-09-07 LAB — APOLIPOPROTEIN B: Apolipoprotein B: 56 mg/dL (ref <90)

## 2023-09-07 SURGERY — ATRIAL FIBRILLATION ABLATION
Anesthesia: General

## 2023-09-07 MED ORDER — ONDANSETRON HCL 4 MG/2ML IJ SOLN
INTRAMUSCULAR | Status: DC | PRN
  Administered 2023-09-07: 4 mg via INTRAVENOUS

## 2023-09-07 MED ORDER — HEPARIN SODIUM (PORCINE) 1000 UNIT/ML IJ SOLN
INTRAMUSCULAR | Status: AC
  Filled 2023-09-07: qty 10

## 2023-09-07 MED ORDER — HEPARIN SODIUM (PORCINE) 1000 UNIT/ML IJ SOLN
INTRAMUSCULAR | Status: DC | PRN
  Administered 2023-09-07: 15000 [IU] via INTRAVENOUS
  Administered 2023-09-07: 8000 [IU] via INTRAVENOUS

## 2023-09-07 MED ORDER — ACETAMINOPHEN 325 MG PO TABS: 650.0000 mg | ORAL_TABLET | ORAL | Status: DC | PRN

## 2023-09-07 MED ORDER — DEXAMETHASONE SODIUM PHOSPHATE 10 MG/ML IJ SOLN
INTRAMUSCULAR | Status: DC | PRN
  Administered 2023-09-07: 10 mg via INTRAVENOUS

## 2023-09-07 MED ORDER — HEPARIN (PORCINE) IN NACL 1000-0.9 UT/500ML-% IV SOLN
INTRAVENOUS | Status: DC | PRN
  Administered 2023-09-07 (×3): 500 mL

## 2023-09-07 MED ORDER — SUGAMMADEX SODIUM 200 MG/2ML IV SOLN
INTRAVENOUS | Status: DC | PRN
  Administered 2023-09-07: 400 mg via INTRAVENOUS

## 2023-09-07 MED ORDER — ATROPINE SULFATE 1 MG/ML IV SOLN
INTRAVENOUS | Status: DC | PRN
  Administered 2023-09-07: 1 mg via INTRAVENOUS

## 2023-09-07 MED ORDER — PROTAMINE SULFATE 10 MG/ML IV SOLN
INTRAVENOUS | Status: DC | PRN
  Administered 2023-09-07: 40 mg via INTRAVENOUS

## 2023-09-07 MED ORDER — SODIUM CHLORIDE 0.9 % IV SOLN: INTRAVENOUS | Status: DC

## 2023-09-07 MED ORDER — ROCURONIUM BROMIDE 10 MG/ML (PF) SYRINGE
PREFILLED_SYRINGE | INTRAVENOUS | Status: DC | PRN
  Administered 2023-09-07: 10 mg via INTRAVENOUS
  Administered 2023-09-07: 70 mg via INTRAVENOUS
  Administered 2023-09-07: 10 mg via INTRAVENOUS

## 2023-09-07 MED ORDER — LIDOCAINE 2% (20 MG/ML) 5 ML SYRINGE
INTRAMUSCULAR | Status: DC | PRN
  Administered 2023-09-07: 60 mg via INTRAVENOUS

## 2023-09-07 MED ORDER — SODIUM CHLORIDE 0.9 % IV SOLN: 250.0000 mL | INTRAVENOUS | Status: DC | PRN

## 2023-09-07 MED ORDER — FENTANYL CITRATE (PF) 100 MCG/2ML IJ SOLN
INTRAMUSCULAR | Status: AC
  Filled 2023-09-07: qty 2

## 2023-09-07 MED ORDER — PHENYLEPHRINE HCL-NACL 20-0.9 MG/250ML-% IV SOLN
INTRAVENOUS | Status: DC | PRN
  Administered 2023-09-07: 40 ug/min via INTRAVENOUS

## 2023-09-07 MED ORDER — SODIUM CHLORIDE 0.9% FLUSH: 3.0000 mL | INTRAVENOUS | Status: DC | PRN

## 2023-09-07 MED ORDER — PHENYLEPHRINE 80 MCG/ML (10ML) SYRINGE FOR IV PUSH (FOR BLOOD PRESSURE SUPPORT)
PREFILLED_SYRINGE | INTRAVENOUS | Status: DC | PRN
  Administered 2023-09-07: 160 ug via INTRAVENOUS

## 2023-09-07 MED ORDER — FENTANYL CITRATE (PF) 250 MCG/5ML IJ SOLN
INTRAMUSCULAR | Status: DC | PRN
  Administered 2023-09-07: 100 ug via INTRAVENOUS

## 2023-09-07 MED ORDER — ONDANSETRON HCL 4 MG/2ML IJ SOLN: 4.0000 mg | Freq: Four times a day (QID) | INTRAMUSCULAR | Status: DC | PRN

## 2023-09-07 MED ORDER — PROPOFOL 10 MG/ML IV BOLUS
INTRAVENOUS | Status: DC | PRN
  Administered 2023-09-07: 200 mg via INTRAVENOUS

## 2023-09-07 SURGICAL SUPPLY — 22 items
BAG SNAP BAND KOVER 36X36 (MISCELLANEOUS) IMPLANT
BLANKET WARM UNDERBOD FULL ACC (MISCELLANEOUS) ×1 IMPLANT
CABLE PFA RX CATH CONN (CABLE) IMPLANT
CATH EZ STEER NAV 8MM D-F CUR (ABLATOR) IMPLANT
CATH FARAWAVE ABLATION 35 (CATHETERS) IMPLANT
CATH OCTARAY 2.0 F 3-3-3-3-3 (CATHETERS) IMPLANT
CATH SOUNDSTAR ECO 8FR (CATHETERS) IMPLANT
CATH WEBSTER BI DIR CS D-F CRV (CATHETERS) IMPLANT
CLOSURE MYNX CONTROL 6F/7F (Vascular Products) IMPLANT
CLOSURE PERCLOSE PROSTYLE (VASCULAR PRODUCTS) IMPLANT
COVER SWIFTLINK CONNECTOR (BAG) ×1 IMPLANT
DILATOR VESSEL 38 20CM 16FR (INTRODUCER) IMPLANT
GUIDEWIRE INQWIRE 1.5J.035X260 (WIRE) IMPLANT
INQWIRE 1.5J .035X260CM (WIRE) ×1
KIT VERSACROSS CNCT FARADRIVE (KITS) IMPLANT
PACK EP LF (CUSTOM PROCEDURE TRAY) ×1 IMPLANT
PAD DEFIB RADIO PHYSIO CONN (PAD) ×1 IMPLANT
PATCH CARTO3 (PAD) IMPLANT
SHEATH FARADRIVE STEERABLE (SHEATH) IMPLANT
SHEATH PINNACLE 8F 10CM (SHEATH) IMPLANT
SHEATH PINNACLE 9F 10CM (SHEATH) IMPLANT
SHEATH PROBE COVER 6X72 (BAG) IMPLANT

## 2023-09-07 NOTE — Transfer of Care (Signed)
Immediate Anesthesia Transfer of Care Note  Patient: Randy [name]  Procedure(s) Performed: ATRIAL FIBRILLATION ABLATION  Patient Location: PACU  Anesthesia Type:General  Level of Consciousness: awake, alert , and oriented  Airway & Oxygen Therapy: Patient connected to nasal cannula oxygen  Post-op Assessment: Report given to RN and Post -op Vital signs reviewed and stable  Post vital signs: Reviewed and stable  Last Vitals:  Vitals Value Taken Time  BP    Temp    Pulse 91 09/07/23 1402  Resp 19 09/07/23 1402  SpO2 92 % 09/07/23 1402  Vitals shown include unfiled device data.  Last Pain:  Vitals:   09/07/23 1027  TempSrc:   PainSc: 0-No pain         Complications: There were no known notable events for this encounter.

## 2023-09-07 NOTE — Anesthesia Procedure Notes (Signed)
Procedure Name: Intubation Date/Time: 09/07/2023 12:11 PM  Performed by: Sharyn Dross, CRNAPre-anesthesia Checklist: Patient identified, Emergency Drugs available, Suction available and Patient being monitored Patient Re-evaluated:Patient Re-evaluated prior to induction Oxygen Delivery Method: Circle system utilized Preoxygenation: Pre-oxygenation with 100% oxygen Induction Type: IV induction Ventilation: Mask ventilation without difficulty and Oral airway inserted - appropriate to patient size Laryngoscope Size: Mac and 4 Grade View: Grade I Tube type: Oral Tube size: 7.5 mm Number of attempts: 1 Airway Equipment and Method: Stylet and Oral airway Placement Confirmation: ETT inserted through vocal cords under direct vision, positive ETCO2 and breath sounds checked- equal and bilateral Secured at: 23 cm Tube secured with: Tape Dental Injury: Teeth and Oropharynx as per pre-operative assessment

## 2023-09-07 NOTE — Anesthesia Postprocedure Evaluation (Signed)
Anesthesia Post Note  Patient: Randy [name]  Procedure(s) Performed: ATRIAL FIBRILLATION ABLATION     Patient location during evaluation: PACU Anesthesia Type: General Level of consciousness: awake and alert Pain management: pain level controlled Vital Signs Assessment: post-procedure vital signs reviewed and stable Respiratory status: spontaneous breathing, nonlabored ventilation and respiratory function stable Cardiovascular status: blood pressure returned to baseline and stable Postop Assessment: no apparent nausea or vomiting Anesthetic complications: no   There were no known notable events for this encounter.  Last Vitals:  Vitals:   09/07/23 1600 09/07/23 1700  BP: 119/69 124/79  Pulse: (!) 102 (!) 105  Resp: (!) 23 (!) 22  Temp:    SpO2: 96% 95%    Last Pain:  Vitals:   09/07/23 1440  TempSrc:   PainSc: 0-No pain                 Kyna Blahnik,W. EDMOND

## 2023-09-07 NOTE — Interval H&P Note (Signed)
History and Physical Interval Note:  09/07/2023 10:49 AM  Randy [name]  has presented today for surgery, with the diagnosis of afib.  The various methods of treatment have been discussed with the patient and family. After consideration of risks, benefits and other options for treatment, the patient has consented to  Procedure(s): ATRIAL FIBRILLATION ABLATION (N/A) as a surgical intervention.  The patient's history has been reviewed, patient examined, no change in status, stable for surgery.  I have reviewed the patient's chart and labs.  Questions were answered to the patient's satisfaction.     Nhyira Leano [employer]

## 2023-09-07 NOTE — Anesthesia Preprocedure Evaluation (Addendum)
Anesthesia Evaluation  Patient identified by MRN, date of birth, ID band Patient awake    Reviewed: Allergy & Precautions, H&P , NPO status , Patient's Chart, lab work & pertinent test results  Airway Mallampati: III  TM Distance: >3 FB Neck ROM: Full    Dental no notable dental hx. (+) Teeth Intact, Dental Advisory Given   Pulmonary neg pulmonary ROS   Pulmonary exam normal breath sounds clear to auscultation       Cardiovascular hypertension, Pt. on medications and Pt. on home beta blockers + dysrhythmias Atrial Fibrillation + Valvular Problems/Murmurs MR  Rhythm:Irregular Rate:Normal     Neuro/Psych negative neurological ROS  negative psych ROS   GI/Hepatic negative GI ROS, Neg liver ROS,,,  Endo/Other  diabetes, Type 2, Oral Hypoglycemic AgentsHypothyroidism  Class 3 obesity  Renal/GU negative Renal ROS  negative genitourinary   Musculoskeletal  (+) Arthritis ,    Abdominal   Peds  Hematology negative hematology ROS (+)   Anesthesia Other Findings   Reproductive/Obstetrics negative OB ROS                             Anesthesia Physical Anesthesia Plan  ASA: 3  Anesthesia Plan: General   Post-op Pain Management: Tylenol PO (pre-op)*   Induction: Intravenous  PONV Risk Score and Plan: 3 and Ondansetron, Dexamethasone and Midazolam  Airway Management Planned: Oral ETT  Additional Equipment:   Intra-op Plan:   Post-operative Plan: Extubation in OR  Informed Consent: I have reviewed the patients History and Physical, chart, labs and discussed the procedure including the risks, benefits and alternatives for the proposed anesthesia with the patient or authorized representative who has indicated his/her understanding and acceptance.     Dental advisory given  Plan Discussed with: CRNA  Anesthesia Plan Comments:        Anesthesia Quick Evaluation

## 2023-09-07 NOTE — Discharge Instructions (Signed)

## 2023-09-07 NOTE — Progress Notes (Signed)
Patient walked to the bathroom without difficulties. Bilateral groins level 0, clean, dry, and intact.

## 2023-09-08 ENCOUNTER — Encounter (HOSPITAL_COMMUNITY): Payer: Self-pay | Admitting: Cardiology

## 2023-09-08 MED FILL — Fentanyl Citrate Preservative Free (PF) Inj 100 MCG/2ML: INTRAMUSCULAR | Qty: 2 | Status: AC

## 2023-09-11 ENCOUNTER — Other Ambulatory Visit: Payer: Self-pay | Admitting: Internal Medicine

## 2023-09-14 ENCOUNTER — Ambulatory Visit: Payer: BC Managed Care – PPO | Admitting: Cardiovascular Disease

## 2023-10-02 ENCOUNTER — Encounter (HOSPITAL_COMMUNITY): Payer: Self-pay | Admitting: Physician Assistant

## 2023-10-02 ENCOUNTER — Encounter: Payer: Self-pay | Admitting: Cardiology

## 2023-10-02 ENCOUNTER — Ambulatory Visit (HOSPITAL_COMMUNITY)
Admission: RE | Admit: 2023-10-02 | Discharge: 2023-10-02 | Discharge status: Home / Self Care | Payer: 59 | Source: Ambulatory Visit | Attending: Physician Assistant | Admitting: Physician Assistant

## 2023-10-02 VITALS — BP 114/60 | HR 80 | Ht 72.0 in | Wt 301.8 lb

## 2023-10-02 DIAGNOSIS — I2510 Atherosclerotic heart disease of native coronary artery without angina pectoris: Secondary | ICD-10-CM | POA: Diagnosis not present

## 2023-10-02 DIAGNOSIS — I4819 Other persistent atrial fibrillation: Secondary | ICD-10-CM | POA: Diagnosis present

## 2023-10-02 DIAGNOSIS — E669 Obesity, unspecified: Secondary | ICD-10-CM | POA: Diagnosis not present

## 2023-10-02 DIAGNOSIS — I4892 Unspecified atrial flutter: Secondary | ICD-10-CM | POA: Insufficient documentation

## 2023-10-02 DIAGNOSIS — Z79899 Other long term (current) drug therapy: Secondary | ICD-10-CM | POA: Insufficient documentation

## 2023-10-02 DIAGNOSIS — E785 Hyperlipidemia, unspecified: Secondary | ICD-10-CM | POA: Diagnosis not present

## 2023-10-02 DIAGNOSIS — I10 Essential (primary) hypertension: Secondary | ICD-10-CM | POA: Insufficient documentation

## 2023-10-02 DIAGNOSIS — D6869 Other thrombophilia: Secondary | ICD-10-CM | POA: Insufficient documentation

## 2023-10-02 DIAGNOSIS — Z6841 Body Mass Index (BMI) 40.0 and over, adult: Secondary | ICD-10-CM | POA: Insufficient documentation

## 2023-10-02 DIAGNOSIS — G4733 Obstructive sleep apnea (adult) (pediatric): Secondary | ICD-10-CM | POA: Diagnosis not present

## 2023-10-02 DIAGNOSIS — Z7901 Long term (current) use of anticoagulants: Secondary | ICD-10-CM | POA: Insufficient documentation

## 2023-10-02 NOTE — Progress Notes (Signed)
Primary Care Physician: Tresa Garter, MD Primary Cardiologist: Dietrich Pates, MD Electrophysiologist: [name], MD  Referring Physician: Dr Randy [name] is a 63 y.o. male with a history of HTN, HLD, DM, hypothyroidism, CAD, OSA, atrial flutter, atrial fibrillation who presents for follow up in the [name]. He is post cardioversion 07/27/2023. After each attempt, he quickly went back into atrial fibrillation. Patient is on Eliquis for stroke prevention. He is s/p afib and flutter ablation with [name] on 09/07/23.  Patient presents today for follow up for atrial fibrillation. He reports that he has done very well since the procedure. He denies any interim symptoms of afib. He is able to climb the stairs to his apartment without becoming SOB. He is using his CPAP consistently. He denies chest pain or groin issues. No bleeding issues on anticoagulation.   Today, he denies symptoms of palpitations, chest pain, shortness of breath, orthopnea, PND, lower extremity edema, dizziness, presyncope, syncope, bleeding, or neurologic sequela. The patient is tolerating medications without difficulties and is otherwise without complaint today.    Atrial Fibrillation Risk Factors:  he does have symptoms or diagnosis of sleep apnea. he does not have a history of rheumatic fever.   Atrial Fibrillation Management history:  Previous antiarrhythmic drugs: none Previous cardioversions: 07/27/23 Previous ablations: 09/07/23 Anticoagulation history: Eliquis  ROS- All systems are reviewed and negative except as per the HPI above.  Past Medical History:  Diagnosis Date   At risk for sleep apnea    STOP-BANG= 6         SENT TO PCP 06-24-2016   Back pain    History of thyroid nodule    s/p  right thyroid lopectomy--  per path report:  follicular adenoma, chronic thyroiditis   Hyperlipidemia    Hypertension    Hypogonadism male     Hypothyroidism    Obesity    Other specified disorders of tendon, right knee    quad tendon tear   SOB (shortness of breath)    Vitamin D deficiency    Wears glasses     Current Outpatient Medications  Medication Sig Dispense Refill   acetaminophen (TYLENOL) 325 MG tablet Take 650 mg by mouth in the morning and at bedtime.     apixaban (ELIQUIS) 5 MG TABS tablet Take 1 tablet (5 mg total) by mouth 2 (two) times daily. 60 tablet 5   Ascorbic Acid (VITAMIN C WITH ROSE HIPS) 1000 MG tablet Take 1,000 mg by mouth daily in the afternoon.     benazepril (LOTENSIN) 20 MG tablet Take 1 tablet (20 mg total) by mouth daily. 90 tablet 3   Cholecalciferol (VITAMIN D3) 50 MCG (2000 UT) TABS Take 2,000 Units by mouth every evening.     Coenzyme Q10 (COQ10) 100 MG CAPS Take 100 mg by mouth every morning.     Cyanocobalamin (B-12) 2000 MCG TABS Take 2,000 mcg by mouth every morning.     diltiazem (CARDIZEM SR) 60 MG 12 hr capsule Take 1 capsule (60 mg total) by mouth every 12 (twelve) hours. 60 capsule 5   levothyroxine (SYNTHROID) 125 MCG tablet Take 2 tablets (250 mcg total) by mouth daily before breakfast. TAKE 2 TABLETS BY MOUTH BEFORE BREAKFAST . 180 tablet 3   lovastatin (MEVACOR) 40 MG tablet Take 1 tablet (40 mg total) by mouth at bedtime. 90 tablet 3   metoprolol tartrate (LOPRESSOR) 25 MG tablet Take 1 tablet (25  mg total) by mouth 2 (two) times daily. 180 tablet 3   Multiple Vitamin (MULTIVITAMIN) tablet Take 1 tablet by mouth daily with breakfast.     OVER THE COUNTER MEDICATION Take 2 capsules by mouth in the morning and at bedtime. Six Star Testosterone Booster Has calcium, sodium, rodeola extract, gingko extract, and citrate     OVER THE COUNTER MEDICATION Take 1 Scoop by mouth in the morning. AG1 (athletic greens) Supplement in liquid     RYBELSUS 14 MG TABS Take 1 tablet by mouth once daily 30 tablet 0   triamterene-hydrochlorothiazide (MAXZIDE-25) 37.5-25 MG tablet Take 1 tablet by mouth  daily. 90 tablet 3   No current facility-administered medications for this encounter.    Physical Exam: BP 114/60   Pulse 80   Ht 6' (1.829 m)   Wt (!) 136.9 kg   BMI 40.93 kg/m   GEN: Well nourished, well developed in no acute distress CARDIAC: Regular rate and rhythm, no murmurs, rubs, gallops RESPIRATORY:  Clear to auscultation without rales, wheezing or rhonchi  ABDOMEN: Soft, non-tender, non-distended EXTREMITIES:  No edema; No deformity   Wt Readings from Last 3 Encounters:  10/02/23 (!) 136.9 kg  09/04/23 (!) 139.6 kg  08/15/23 (!) 138.7 kg     EKG today demonstrates  SR Vent. rate 80 BPM PR interval 168 ms QRS duration 110 ms QT/QTcB 384/442 ms   Echo 05/20/23 demonstrated   1. Left ventricular ejection fraction, by estimation, is 50 to 55%. The  left ventricle has low normal function. The left ventricle has no regional  wall motion abnormalities. There is mild asymmetric left ventricular  hypertrophy of the basal and septal segments. Left ventricular diastolic parameters are indeterminate.   2. Right ventricular systolic function is normal. The right ventricular  size is normal.   3. Left atrial size was severely dilated.   4. Right atrial size was moderately dilated.   5. The mitral valve is abnormal. Mild mitral valve regurgitation. No  evidence of mitral stenosis.   6. The aortic valve is tricuspid. There is mild calcification of the  aortic valve. There is mild thickening of the aortic valve. Aortic valve  regurgitation is not visualized. Aortic valve sclerosis is present, with  no evidence of aortic valve stenosis.   7. The inferior vena cava is normal in size with greater than 50%  respiratory variability, suggesting right atrial pressure of 3 mmHg.    CHA2DS2-VASc Score = 3  The patient's score is based upon: CHF History: 0 HTN History: 1 Diabetes History: 1 Stroke History: 0 Vascular Disease History: 1 Age Score: 0 Gender Score: 0        ASSESSMENT AND PLAN: Persistent Atrial Fibrillation/atrial flutter The patient's CHA2DS2-VASc score is 3, indicating a 3.2% annual risk of stroke.   S/p afib and flutter ablation 09/07/23 Patient appears to be maintaining SR Continue Eliquis 5 mg BID with no missed doses for 3 months post ablation. Continue diltiazem 60 mg BID Continue Lopressor 25 mg BID  Secondary Hypercoagulable State (ICD10:  D68.69) The patient is at significant risk for stroke/thromboembolism based upon his CHA2DS2-VASc Score of 3.  Continue Apixaban (Eliquis). No bleeding issues.  HTN Stable on current regimen  Obesity Body mass index is 40.93 kg/m.  Encouraged lifestyle modification He is on Rybelsus and has lost ~70 lbs  OSA  Encouraged nightly CPAP The importance of adequate treatment of sleep apnea was discussed today in order to improve our ability to maintain sinus  rhythm long term. Followed by Dr Mayford Knife  CAD Very high calcium score on CT, 2312 No anginal symptoms Scheduled for PET stress test 1/30 Followed by Dr Tenny Craw    Follow up with [name] as scheduled.       [name] PA-C [facility] [address] [place], [place] [address] [phone]

## 2023-10-05 ENCOUNTER — Ambulatory Visit (HOSPITAL_COMMUNITY): Payer: BC Managed Care – PPO | Admitting: Physician Assistant

## 2023-10-06 ENCOUNTER — Other Ambulatory Visit: Payer: Self-pay | Admitting: Internal Medicine

## 2023-10-10 ENCOUNTER — Encounter (HOSPITAL_COMMUNITY): Payer: Self-pay

## 2023-10-12 ENCOUNTER — Ambulatory Visit
Admission: RE | Admit: 2023-10-12 | Discharge: 2023-10-12 | Disposition: A | Discharge status: Home / Self Care | Payer: 59 | Source: Ambulatory Visit | Attending: Internal Medicine | Admitting: Internal Medicine

## 2023-10-12 DIAGNOSIS — I4891 Unspecified atrial fibrillation: Secondary | ICD-10-CM | POA: Diagnosis not present

## 2023-10-12 DIAGNOSIS — I700 Atherosclerosis of aorta: Secondary | ICD-10-CM | POA: Diagnosis not present

## 2023-10-12 DIAGNOSIS — I10 Essential (primary) hypertension: Secondary | ICD-10-CM

## 2023-10-12 DIAGNOSIS — I2510 Atherosclerotic heart disease of native coronary artery without angina pectoris: Secondary | ICD-10-CM | POA: Insufficient documentation

## 2023-10-12 MED ORDER — REGADENOSON 0.4 MG/5ML IV SOLN
INTRAVENOUS | Status: AC
  Filled 2023-10-12: qty 5

## 2023-10-12 MED ORDER — RUBIDIUM RB82 GENERATOR (RUBYFILL)
25.0000 | PACK | Freq: Once | INTRAVENOUS | Status: AC
  Administered 2023-10-12: 24.96 via INTRAVENOUS

## 2023-10-12 MED ORDER — REGADENOSON 0.4 MG/5ML IV SOLN
0.4000 mg | Freq: Once | INTRAVENOUS | Status: AC
Start: 2023-10-12 — End: 2023-10-12
  Administered 2023-10-12: 0.4 mg via INTRAVENOUS
  Filled 2023-10-12: qty 5

## 2023-10-12 MED ORDER — RUBIDIUM RB82 GENERATOR (RUBYFILL)
25.0000 | PACK | Freq: Once | INTRAVENOUS | Status: AC
  Administered 2023-10-12: 24.95 via INTRAVENOUS

## 2023-10-12 NOTE — Progress Notes (Signed)
Patient presents for a cardiac PET stress test and tolerated procedure without incident. Patient maintained acceptable vital signs throughout the test and was offered caffeine after test.  Patient ambulated out of department with a steady gait.

## 2023-10-13 LAB — NM PET CT CARDIAC PERFUSION MULTI W/ABSOLUTE BLOODFLOW
LV dias vol: 182 mL (ref 62–150)
MBFR: 1.83
Nuc Rest EF: 48 %
Nuc Stress EF: 53 %
Peak HR: 85 {beats}/min
Rest HR: 65 {beats}/min
Rest MBF: 0.78 ml/g/min
Rest Nuclear Isotope Dose: 25 mCi
Rest perfusion cavity size (mL): 182 mL
SRS: 8
SSS: 15
ST Depression (mm): 0 mm
Stress MBF: 1.43 ml/g/min
Stress Nuclear Isotope Dose: 25 mCi
Stress perfusion cavity size (mL): 187 mL
TID: 1

## 2023-10-16 ENCOUNTER — Encounter: Payer: Self-pay | Admitting: Cardiology

## 2023-10-16 ENCOUNTER — Encounter: Payer: Self-pay | Admitting: Internal Medicine

## 2023-10-16 ENCOUNTER — Ambulatory Visit: Payer: 59 | Attending: Cardiology | Admitting: Cardiology

## 2023-10-16 VITALS — HR 70 | Ht 72.0 in | Wt 301.0 lb

## 2023-10-16 DIAGNOSIS — I10 Essential (primary) hypertension: Secondary | ICD-10-CM | POA: Diagnosis not present

## 2023-10-16 DIAGNOSIS — G4733 Obstructive sleep apnea (adult) (pediatric): Secondary | ICD-10-CM

## 2023-10-16 NOTE — Progress Notes (Signed)
SLEEP MEDICINE VIRTUAL CONSULT NOTE via Video Note   Because of Keaston S Huhta's co-morbid illnesses, he is at least at moderate risk for complications without adequate follow up.  This format is felt to be most appropriate for this patient at this time.  All issues noted in this document were discussed and addressed.  A limited physical exam was performed with this format.  Please refer to the patient's chart for his consent to telehealth for [facility].      Date:  10/16/2023   ID:  [name], DOB 1961-04-22, MRN [id number] The patient was identified using 2 identifiers.  Patient Location: Home Provider Location: Home Office   PCP:  Plotnikov, Georgina Quint, MD   [facility] Providers Cardiologist:  Dietrich Pates, MD Electrophysiologist:  [name], MD     Evaluation Performed:  New Patient Evaluation  Chief Complaint:  OSA  History of Present Illness:    EWARD RUTIGLIANO is a 63 y.o. male who is being seen today for the evaluation of OSA at the request of Dietrich Pates, MD.  JEDI CATALFAMO is a 63 y.o. male with a hx of HTN, HLD, PAF and SOB.  He has been told he snores in the past.  Due to recent new PAF and obesity, he was set up for a HST.  He underwent HST 07/23/23 which showed moderate OSA with an AHI of 17.5/hr and was started on auto CPAP from 4 to 15cm H2O.  He is not referred for sleep medicine consultation to establish sleep care and treatment of OSA.  He is doing well with his PAP device and thinks that he has gotten used to it.  He tolerates the nasal pillow mask with chin strap and feels the pressure is adequate.  Since going on PAP he feels rested in the am and has no significant daytime sleepiness.  He denies any significant mouth or nasal dryness or nasal congestion.  He does not think that he snores.     Past Medical History:  Diagnosis Date   At risk for sleep apnea    STOP-BANG= 6         SENT TO PCP 06-24-2016   Back pain     History of thyroid nodule    s/p  right thyroid lopectomy--  per path report:  follicular adenoma, chronic thyroiditis   Hyperlipidemia    Hypertension    Hypogonadism male    Hypothyroidism    Obesity    OSA on CPAP    moderate OSA with an AHI of 17.5/h on auto CPAP   Other specified disorders of tendon, right knee    quad tendon tear   PAF (paroxysmal atrial fibrillation) (HCC)    SOB (shortness of breath)    Vitamin D deficiency    Wears glasses    Past Surgical History:  Procedure Laterality Date   ATRIAL FIBRILLATION ABLATION N/A 09/07/2023   Procedure: ATRIAL FIBRILLATION ABLATION;  Surgeon: Regan Lemming, MD;  Location: [facility] INVASIVE CV LAB;  Service: Cardiovascular;  Laterality: N/A;   CARDIOVERSION N/A 07/27/2023   Procedure: CARDIOVERSION (CATH LAB);  Surgeon: Sande Rives, MD;  Location: [facility] INVASIVE CV LAB;  Service: Cardiovascular;  Laterality: N/A;   COLONOSCOPY  2006   EYE SURGERY Right age 22   removal glass   HYPOSPADIAS CORRECTION  age 63   IR CERVICAL/THORACIC DISC ASPIRATION W/IMAG GUIDE  03/31/2020  IR CERVICAL/THORACIC DISC ASPIRATION W/IMAG GUIDE  03/31/2020   QUADRICEPS TENDON REPAIR Left 06/28/2016   Procedure: LEFT KNEE REPAIR QUADRICEP TENDON;  Surgeon: Eugenia Mcalpine, MD;  Location: [facility];  Service: Orthopedics;  Laterality: Left;   THYROID LOBECTOMY Right 02/03/2005     No outpatient medications have been marked as taking for the 10/16/23 encounter (Video Visit) with Quintella Reichert, MD.     Allergies:   Penicillins and Watermelon [citrullus vulgaris]   Social History   Tobacco Use   Smoking status: Never   Smokeless tobacco: Never   Tobacco comments:    Never smoked 10/02/23  Vaping Use   Vaping status: Never Used  Substance Use Topics   Alcohol use: Not Currently    Alcohol/week: 1.0 standard drink of alcohol    Types: 1 Cans of beer per week    Comment: occasional   Drug use: No     Family  Hx: The patient's family history includes Alcohol abuse in his father; Anxiety disorder in his mother; Arthritis in his father; COPD in his father; Depression in his mother; Heart disease in his father and mother; Hyperlipidemia in his father and mother; Hypertension in his father and mother; Liver disease in his father; Mental illness in his mother; Schizophrenia in his mother; Sleep apnea in his father; Thyroid disease in his father and mother.  ROS:   Please see the history of present illness.     All other systems reviewed and are negative.   Prior Sleep studies:   The following studies were reviewed today:  HST< PAP compliance download  Labs/Other Tests and Data Reviewed:     Recent Labs: 05/18/2023: Magnesium 2.1; TSH 0.393 05/23/2023: ALT 43 08/15/2023: Hemoglobin 14.0; Platelets 246 09/04/2023: BUN 19; Creatinine, Ser 1.16; Potassium 4.1; Sodium 140    Wt Readings from Last 3 Encounters:  10/02/23 (!) 301 lb 12.8 oz (136.9 kg)  09/04/23 (!) 307 lb 12.8 oz (139.6 kg)  08/15/23 (!) 305 lb 12.8 oz (138.7 kg)     Risk Assessment/Calculations:    CHA2DS2-VASc Score = 3   This indicates a 3.2% annual risk of stroke. The patient's score is based upon: CHF History: 0 HTN History: 1 Diabetes History: 1 Stroke History: 0 Vascular Disease History: 1 Age Score: 0 Gender Score: 0     STOP-Bang Score:  7      Objective:    Vital Signs:  There were no vitals taken for this visit.   VITAL SIGNS:  reviewed GEN:  no acute distress EYES:  sclerae anicteric, EOMI - Extraocular Movements Intact RESPIRATORY:  normal respiratory effort, symmetric expansion CARDIOVASCULAR:  no peripheral edema SKIN:  no rash, lesions or ulcers. MUSCULOSKELETAL:  no obvious deformities. NEURO:  alert and oriented x 3, no obvious focal deficit PSYCH:  normal affect  ASSESSMENT & PLAN:    OSA - The patient is tolerating PAP therapy well without any problems. The PAP download performed by his  DME was personally reviewed and interpreted by me today and showed an AHI of 1.5/hr on auto CPAP from 4 to 15 cm H2O with 97% compliance in using more than 4 hours nightly.  The patient has been using and benefiting from PAP use and will continue to benefit from therapy.   HTN -BP controlled at home and usually runs  -continue prescription drug management with Benazepril 20mg  daily and Lopressor 25mg  BID and Maxzide 37.5-25mg  daily with PRN refills   Time:   Today, I  have spent 20 minutes with the patient with telehealth technology discussing the above problems.     Medication Adjustments/Labs and Tests Ordered: Current medicines are reviewed at length with the patient today.  Concerns regarding medicines are outlined above.   Tests Ordered: No orders of the defined types were placed in this encounter.   Medication Changes: No orders of the defined types were placed in this encounter.   Follow Up:  In Person in 1 year(s)  Signed, Armanda Magic, MD  10/16/2023 8:29 AM    [facility]

## 2023-10-17 ENCOUNTER — Other Ambulatory Visit: Payer: Self-pay

## 2023-10-17 ENCOUNTER — Ambulatory Visit: Payer: 59 | Admitting: Internal Medicine

## 2023-10-17 ENCOUNTER — Ambulatory Visit: Payer: 59 | Attending: Internal Medicine | Admitting: Internal Medicine

## 2023-10-17 VITALS — BP 142/84 | HR 64 | Wt 301.0 lb

## 2023-10-17 DIAGNOSIS — I4891 Unspecified atrial fibrillation: Secondary | ICD-10-CM

## 2023-10-17 DIAGNOSIS — I480 Paroxysmal atrial fibrillation: Secondary | ICD-10-CM

## 2023-10-17 DIAGNOSIS — I4819 Other persistent atrial fibrillation: Secondary | ICD-10-CM

## 2023-10-17 DIAGNOSIS — G4733 Obstructive sleep apnea (adult) (pediatric): Secondary | ICD-10-CM

## 2023-10-17 DIAGNOSIS — I2510 Atherosclerotic heart disease of native coronary artery without angina pectoris: Secondary | ICD-10-CM

## 2023-10-17 DIAGNOSIS — Z01812 Encounter for preprocedural laboratory examination: Secondary | ICD-10-CM

## 2023-10-17 DIAGNOSIS — I10 Essential (primary) hypertension: Secondary | ICD-10-CM

## 2023-10-17 DIAGNOSIS — Z01810 Encounter for preprocedural cardiovascular examination: Secondary | ICD-10-CM

## 2023-10-17 NOTE — Patient Instructions (Signed)
 Medication Instructions:   *If you need a refill on your cardiac medications before your next appointment, please call your pharmacy*   Lab Work:  If you have labs (blood work) drawn today and your tests are completely normal, you will receive your results only by: MyChart Message (if you have MyChart) OR A paper copy in the mail If you have any lab test that is abnormal or we need to change your treatment, we will call you to review the results.   Testing/Procedures:    Follow-Up: At [facility], you and your health needs are our priority.  As part of our continuing mission to provide you with exceptional heart care, we have created designated Provider Care Teams.  These Care Teams include your primary Cardiologist (physician) and Advanced Practice Providers (APPs -  Physician Assistants and Nurse Practitioners) who all work together to provide you with the care you need, when you need it.  We recommend signing up for the patient portal called "MyChart".  Sign up information is provided on this After Visit Summary.  MyChart is used to connect with patients for Virtual Visits (Telemedicine).  Patients are able to view lab/test results, encounter notes, upcoming appointments, etc.  Non-urgent messages can be sent to your provider as well.   To learn more about what you can do with MyChart, go to [email or web address].

## 2023-10-18 ENCOUNTER — Telehealth: Payer: Self-pay | Admitting: *Deleted

## 2023-10-18 LAB — BASIC METABOLIC PANEL
BUN/Creatinine Ratio: 16 (ref 10–24)
BUN: 17 mg/dL (ref 8–27)
CO2: 23 mmol/L (ref 20–29)
Calcium: 9.2 mg/dL (ref 8.6–10.2)
Chloride: 100 mmol/L (ref 96–106)
Creatinine, Ser: 1.06 mg/dL (ref 0.76–1.27)
Glucose: 86 mg/dL (ref 70–99)
Potassium: 4 mmol/L (ref 3.5–5.2)
Sodium: 138 mmol/L (ref 134–144)
eGFR: 79 mL/min/{1.73_m2} (ref >59)

## 2023-10-18 LAB — CBC
Hematocrit: 42.9 % (ref 37.5–51.0)
Hemoglobin: 14.7 g/dL (ref 13.0–17.7)
MCH: 31.3 pg (ref 26.6–33.0)
MCHC: 34.3 g/dL (ref 31.5–35.7)
MCV: 92 fL (ref 79–97)
Platelets: 243 10*3/uL (ref 150–450)
RBC: 4.69 x10E6/uL (ref 4.14–5.80)
RDW: 13.2 % (ref 11.6–15.4)
WBC: 4.9 10*3/uL (ref 3.4–10.8)

## 2023-10-18 NOTE — Telephone Encounter (Addendum)
 Cardiac Catheterization scheduled at [facility] for: Thursday October 19, 2023 10:30 AM Arrival time [facility] at: 8:30 AM  Nothing to eat after midnight prior to procedure, clear liquids until 5 AM day of procedure.  Medication instructions: -Hold:  Eliquis -none 10/17/23 until post procedure  Rybelsus -AM of procedure Triamterene -hydrochlorothiazide -AM of procedure   -Other usual morning medications can be taken with sips of water including aspirin  81 mg.  Plan to go home the same day, you will only stay overnight if medically necessary.  You must have responsible adult to drive you home.  Someone must be with you the first 24 hours after you arrive home.  Reviewed procedure instructions with patient.

## 2023-10-18 NOTE — H&P (View-Only) (Signed)
 Cardiology Office Note   Date:  10/18/2023   ID:  Randy [name], DOB 11-07-1960, MRN [id number]  PCP:  Garald Karlynn GAILS, MD  Cardiologist:   Vina Gull, MD   Pt presents for follow up of afib and CAD    History of Present Illness: Randy [name] is a 63 y.o. male with a history of HTN, HL, T2DM, OSA (using CPAP), atrial fibrillation and CAD by CT scan.    I saw the pt for the first time in October 2024  Had been admitted to [facility] this fall for a leg infection   Found to be in atrial fib   Placed on diltiazem  and Eliquis    Pt noted that his energy was down   Got winded with stairs He was set up for DCCV   He converted x 2 to SR but quickly reverted to afib each time  Pt set up for afib ablation   Pre-ablation cardiac CT showed Ca score of 2312. Pt underwent afib ablation on 09/07/23  SInce ablation the pt says he has been feeling better   Breathing is better    He has more energy      Pt set up for Lexiscan  PET / CT   This was done on 10/12/23  This showed a medium sized severe reversible defect in the mid septum to apex   LVEF was mildly reduced with a wall motion abnormality in this region   Global MBFR was reduced at 1.83         The pt denies CP   Again, energy improved since ablation.     No outpatient medications have been marked as taking for the 10/17/23 encounter (Office Visit) with Gull Vina GAILS, MD.     Allergies:   Penicillins and Watermelon [citrullus vulgaris]   Past Medical History:  Diagnosis Date   At risk for sleep apnea    STOP-BANG= 6         SENT TO PCP 06-24-2016   Back pain    History of thyroid  nodule    s/p  right thyroid  lopectomy--  per path report:  follicular adenoma, chronic thyroiditis   Hyperlipidemia    Hypertension    Hypogonadism male    Hypothyroidism    Obesity    OSA on CPAP    moderate OSA with an AHI of 17.5/h on auto CPAP   Other specified disorders of tendon, right knee    quad tendon tear   PAF (paroxysmal atrial  fibrillation) (HCC)    SOB (shortness of breath)    Vitamin D  deficiency    Wears glasses     Past Surgical History:  Procedure Laterality Date   ATRIAL FIBRILLATION ABLATION N/A 09/07/2023   Procedure: ATRIAL FIBRILLATION ABLATION;  Surgeon: Inocencio Soyla Lunger, MD;  Location: [facility] INVASIVE CV LAB;  Service: Cardiovascular;  Laterality: N/A;   CARDIOVERSION N/A 07/27/2023   Procedure: CARDIOVERSION (CATH LAB);  Surgeon: Barbaraann Darryle Ned, MD;  Location: [facility] INVASIVE CV LAB;  Service: Cardiovascular;  Laterality: N/A;   COLONOSCOPY  2006   EYE SURGERY Right age 22   removal glass   HYPOSPADIAS CORRECTION  age 39   IR CERVICAL/THORACIC DISC ASPIRATION W/IMAG GUIDE  03/31/2020       IR CERVICAL/THORACIC DISC ASPIRATION W/IMAG GUIDE  03/31/2020   QUADRICEPS TENDON REPAIR Left 06/28/2016   Procedure: LEFT KNEE REPAIR QUADRICEP TENDON;  Surgeon: Lamar Collet, MD;  Location: [facility];  Service: Orthopedics;  Laterality: Left;   THYROID  LOBECTOMY Right 02/03/2005     Social History:  The patient  reports that he has never smoked. He has never used smokeless tobacco. He reports that he does not currently use alcohol after a past usage of about 1.0 standard drink of alcohol per week. He reports that he does not use drugs.   Family History:  The patient's family history includes Alcohol abuse in his father; Anxiety disorder in his mother; Arthritis in his father; COPD in his father; Depression in his mother; Heart disease in his father and mother; Hyperlipidemia in his father and mother; Hypertension in his father and mother; Liver disease in his father; Mental illness in his mother; Schizophrenia in his mother; Sleep apnea in his father; Thyroid  disease in his father and mother.    ROS:  Please see the history of present illness. All other systems are reviewed and  Negative to the above problem except as noted.    PHYSICAL EXAM: VS:  BP (!) 142/84   Pulse 64   Wt (!) 301  lb (136.5 kg)   SpO2 97%   BMI 40.82 kg/m   GEN: Obese 63 yo in no acute distress  HEENT: normal  Neck: JVP is normal  No bruits  Cardiac: Irreg irreg  No murmurs   Tr LE edema  Respiratory:  clear to auscultation Abd  Benign  NO hepatomegaly    EKG:  EKG is not ordered today.  NM PET CT  10/12/23     Medium size, severe, reversible defect in the mid septum into apex consistent with LAD ischemia. WMA in this region is present with stress also consistent with ischemia. LVEF is mildly reduced, but normal response to stress. MBFR is severely reduced in this defect (1.08). Global MBFR is reduced at 1.83, raising concerns for possible multi-vessel CAD. Ovearll, high risk study with concerns for LAD ischemia and possibly multi-vessel CAD.   LV perfusion is abnormal. There is evidence of ischemia. There is no evidence of infarction. Defect 1: There is a medium defect with severe reduction in uptake present in the apical to mid septal and apex location(s) that is reversible. There is abnormal wall motion in the defect area. Consistent with ischemia.   Rest left ventricular function is abnormal. Rest global function is mildly reduced. There were no regional wall motion abnormalities. Rest EF: 48%. Stress left ventricular function is normal. Stress EF: 53%. End diastolic cavity size is moderately enlarged.   Myocardial blood flow was computed to be 0.16ml/g/min at rest and 1.36ml/g/min at stress. Global myocardial blood flow reserve was 1.83 and was mildly abnormal.   Coronary calcium  was present on the attenuation correction CT images. Severe coronary calcifications were present. Coronary calcifications were present in the left anterior descending artery, left circumflex artery and right coronary artery distribution(s).   Findings are consistent with ischemia. The study is high risk.   Electronically signed by Darryle Decent, MD  Cardiac CT   08/16/23 1. There is normal pulmonary vein drainage into the  left atrium with ostial measurements above.   2. There is no thrombus in the left atrial appendage.   3. The esophagus runs in the left atrial midline and is not in proximity to any of the pulmonary vein ostia.   4. No PFO/ASD.   5. Normal coronary origin. Right dominance.   6. CAC score of 2312 Agatston units which is 98th percentile for age-, race-, and sex-matched controls. This suggests high risk for  future cardiac events.   Echo  Sept 2024    1. Left ventricular ejection fraction, by estimation, is 50 to 55%. The  left ventricle has low normal function. The left ventricle has no regional  wall motion abnormalities. There is mild asymmetric left ventricular  hypertrophy of the basal and septal  segments. Left ventricular diastolic parameters are indeterminate.   2. Right ventricular systolic function is normal. The right ventricular  size is normal.   3. Left atrial size was severely dilated.   4. Right atrial size was moderately dilated.   5. The mitral valve is abnormal. Mild mitral valve regurgitation. No  evidence of mitral stenosis.   6. The aortic valve is tricuspid. There is mild calcification of the  aortic valve. There is mild thickening of the aortic valve. Aortic valve  regurgitation is not visualized. Aortic valve sclerosis is present, with  no evidence of aortic valve stenosis.   7. The inferior vena cava is normal in size with greater than 50%  respiratory variability, suggesting right atrial pressure of 3 mmH   Lipid Panel    Component Value Date/Time   CHOL 110 02/08/2023 0744   CHOL 142 02/20/2020 1136   TRIG 67.0 02/08/2023 0744   HDL 32.50 (L) 02/08/2023 0744   HDL 38 (L) 02/20/2020 1136   CHOLHDL 3 02/08/2023 0744   VLDL 13.4 02/08/2023 0744   LDLCALC 64 02/08/2023 0744   LDLCALC 89 02/20/2020 1136      Wt Readings from Last 3 Encounters:  10/17/23 (!) 301 lb (136.5 kg)  10/16/23 (!) 301 lb (136.5 kg)  10/02/23 (!) 301 lb 12.8 oz (136.9  kg)      ASSESSMENT AND PLAN:  1  CAD  Pt with severe coronary calcifications  Recent NM PET / CT showed ischemia in distribution of LAD   Also noted reduced MBFR   Concern for possible multivessel CAD  Overall high risk scan The pt denies CP and does feel better since ablation  But given high risk findings of scan would recomm L heart cath to define anatomy, possibly intervene  Risks/benefits of procedure explained   Pt understands and agrees to proceed. PRecath labs today         2  Atrial fibrillation  Diagnosed Fall 2024   He is s/p ablation by LELON Norton in Dec 2024   He remains in SR    Feeling better   Holding Eliquis  precath    3 OSA  Continue CPAP  4  HL  Pt taking Crestor   LDL 52  HDL 35     5  HTN  Good control        Current medicines are reviewed at length with the patient today.  The patient does not have concerns regarding medicines.  Signed, Vina Gull, MD  10/18/2023 10:49 PM    [facility] [address] [place], [place], [place]  [address] Phone: [phone]; Fax: [phone]

## 2023-10-18 NOTE — Progress Notes (Signed)
 Cardiology Office Note   Date:  10/18/2023   ID:  Randy [name], DOB 11-07-1960, MRN [id number]  PCP:  Garald Karlynn GAILS, MD  Cardiologist:   Vina Gull, MD   Pt presents for follow up of afib and CAD    History of Present Illness: Randy [name] is a 63 y.o. male with a history of HTN, HL, T2DM, OSA (using CPAP), atrial fibrillation and CAD by CT scan.    I saw the pt for the first time in October 2024  Had been admitted to [facility] this fall for a leg infection   Found to be in atrial fib   Placed on diltiazem  and Eliquis    Pt noted that his energy was down   Got winded with stairs He was set up for DCCV   He converted x 2 to SR but quickly reverted to afib each time  Pt set up for afib ablation   Pre-ablation cardiac CT showed Ca score of 2312. Pt underwent afib ablation on 09/07/23  SInce ablation the pt says he has been feeling better   Breathing is better    He has more energy      Pt set up for Lexiscan  PET / CT   This was done on 10/12/23  This showed a medium sized severe reversible defect in the mid septum to apex   LVEF was mildly reduced with a wall motion abnormality in this region   Global MBFR was reduced at 1.83         The pt denies CP   Again, energy improved since ablation.     No outpatient medications have been marked as taking for the 10/17/23 encounter (Office Visit) with Gull Vina GAILS, MD.     Allergies:   Penicillins and Watermelon [citrullus vulgaris]   Past Medical History:  Diagnosis Date   At risk for sleep apnea    STOP-BANG= 6         SENT TO PCP 06-24-2016   Back pain    History of thyroid  nodule    s/p  right thyroid  lopectomy--  per path report:  follicular adenoma, chronic thyroiditis   Hyperlipidemia    Hypertension    Hypogonadism male    Hypothyroidism    Obesity    OSA on CPAP    moderate OSA with an AHI of 17.5/h on auto CPAP   Other specified disorders of tendon, right knee    quad tendon tear   PAF (paroxysmal atrial  fibrillation) (HCC)    SOB (shortness of breath)    Vitamin D  deficiency    Wears glasses     Past Surgical History:  Procedure Laterality Date   ATRIAL FIBRILLATION ABLATION N/A 09/07/2023   Procedure: ATRIAL FIBRILLATION ABLATION;  Surgeon: Inocencio Soyla Lunger, MD;  Location: [facility] INVASIVE CV LAB;  Service: Cardiovascular;  Laterality: N/A;   CARDIOVERSION N/A 07/27/2023   Procedure: CARDIOVERSION (CATH LAB);  Surgeon: Barbaraann Darryle Ned, MD;  Location: [facility] INVASIVE CV LAB;  Service: Cardiovascular;  Laterality: N/A;   COLONOSCOPY  2006   EYE SURGERY Right age 22   removal glass   HYPOSPADIAS CORRECTION  age 39   IR CERVICAL/THORACIC DISC ASPIRATION W/IMAG GUIDE  03/31/2020       IR CERVICAL/THORACIC DISC ASPIRATION W/IMAG GUIDE  03/31/2020   QUADRICEPS TENDON REPAIR Left 06/28/2016   Procedure: LEFT KNEE REPAIR QUADRICEP TENDON;  Surgeon: Lamar Collet, MD;  Location: [facility];  Service: Orthopedics;  Laterality: Left;   THYROID  LOBECTOMY Right 02/03/2005     Social History:  The patient  reports that he has never smoked. He has never used smokeless tobacco. He reports that he does not currently use alcohol after a past usage of about 1.0 standard drink of alcohol per week. He reports that he does not use drugs.   Family History:  The patient's family history includes Alcohol abuse in his father; Anxiety disorder in his mother; Arthritis in his father; COPD in his father; Depression in his mother; Heart disease in his father and mother; Hyperlipidemia in his father and mother; Hypertension in his father and mother; Liver disease in his father; Mental illness in his mother; Schizophrenia in his mother; Sleep apnea in his father; Thyroid  disease in his father and mother.    ROS:  Please see the history of present illness. All other systems are reviewed and  Negative to the above problem except as noted.    PHYSICAL EXAM: VS:  BP (!) 142/84   Pulse 64   Wt (!) 301  lb (136.5 kg)   SpO2 97%   BMI 40.82 kg/m   GEN: Obese 63 yo in no acute distress  HEENT: normal  Neck: JVP is normal  No bruits  Cardiac: Irreg irreg  No murmurs   Tr LE edema  Respiratory:  clear to auscultation Abd  Benign  NO hepatomegaly    EKG:  EKG is not ordered today.  NM PET CT  10/12/23     Medium size, severe, reversible defect in the mid septum into apex consistent with LAD ischemia. WMA in this region is present with stress also consistent with ischemia. LVEF is mildly reduced, but normal response to stress. MBFR is severely reduced in this defect (1.08). Global MBFR is reduced at 1.83, raising concerns for possible multi-vessel CAD. Ovearll, high risk study with concerns for LAD ischemia and possibly multi-vessel CAD.   LV perfusion is abnormal. There is evidence of ischemia. There is no evidence of infarction. Defect 1: There is a medium defect with severe reduction in uptake present in the apical to mid septal and apex location(s) that is reversible. There is abnormal wall motion in the defect area. Consistent with ischemia.   Rest left ventricular function is abnormal. Rest global function is mildly reduced. There were no regional wall motion abnormalities. Rest EF: 48%. Stress left ventricular function is normal. Stress EF: 53%. End diastolic cavity size is moderately enlarged.   Myocardial blood flow was computed to be 0.16ml/g/min at rest and 1.36ml/g/min at stress. Global myocardial blood flow reserve was 1.83 and was mildly abnormal.   Coronary calcium  was present on the attenuation correction CT images. Severe coronary calcifications were present. Coronary calcifications were present in the left anterior descending artery, left circumflex artery and right coronary artery distribution(s).   Findings are consistent with ischemia. The study is high risk.   Electronically signed by Darryle Decent, MD  Cardiac CT   08/16/23 1. There is normal pulmonary vein drainage into the  left atrium with ostial measurements above.   2. There is no thrombus in the left atrial appendage.   3. The esophagus runs in the left atrial midline and is not in proximity to any of the pulmonary vein ostia.   4. No PFO/ASD.   5. Normal coronary origin. Right dominance.   6. CAC score of 2312 Agatston units which is 98th percentile for age-, race-, and sex-matched controls. This suggests high risk for  future cardiac events.   Echo  Sept 2024    1. Left ventricular ejection fraction, by estimation, is 50 to 55%. The  left ventricle has low normal function. The left ventricle has no regional  wall motion abnormalities. There is mild asymmetric left ventricular  hypertrophy of the basal and septal  segments. Left ventricular diastolic parameters are indeterminate.   2. Right ventricular systolic function is normal. The right ventricular  size is normal.   3. Left atrial size was severely dilated.   4. Right atrial size was moderately dilated.   5. The mitral valve is abnormal. Mild mitral valve regurgitation. No  evidence of mitral stenosis.   6. The aortic valve is tricuspid. There is mild calcification of the  aortic valve. There is mild thickening of the aortic valve. Aortic valve  regurgitation is not visualized. Aortic valve sclerosis is present, with  no evidence of aortic valve stenosis.   7. The inferior vena cava is normal in size with greater than 50%  respiratory variability, suggesting right atrial pressure of 3 mmH   Lipid Panel    Component Value Date/Time   CHOL 110 02/08/2023 0744   CHOL 142 02/20/2020 1136   TRIG 67.0 02/08/2023 0744   HDL 32.50 (L) 02/08/2023 0744   HDL 38 (L) 02/20/2020 1136   CHOLHDL 3 02/08/2023 0744   VLDL 13.4 02/08/2023 0744   LDLCALC 64 02/08/2023 0744   LDLCALC 89 02/20/2020 1136      Wt Readings from Last 3 Encounters:  10/17/23 (!) 301 lb (136.5 kg)  10/16/23 (!) 301 lb (136.5 kg)  10/02/23 (!) 301 lb 12.8 oz (136.9  kg)      ASSESSMENT AND PLAN:  1  CAD  Pt with severe coronary calcifications  Recent NM PET / CT showed ischemia in distribution of LAD   Also noted reduced MBFR   Concern for possible multivessel CAD  Overall high risk scan The pt denies CP and does feel better since ablation  But given high risk findings of scan would recomm L heart cath to define anatomy, possibly intervene  Risks/benefits of procedure explained   Pt understands and agrees to proceed. PRecath labs today         2  Atrial fibrillation  Diagnosed Fall 2024   He is s/p ablation by LELON Norton in Dec 2024   He remains in SR    Feeling better   Holding Eliquis  precath    3 OSA  Continue CPAP  4  HL  Pt taking Crestor   LDL 52  HDL 35     5  HTN  Good control        Current medicines are reviewed at length with the patient today.  The patient does not have concerns regarding medicines.  Signed, Vina Gull, MD  10/18/2023 10:49 PM    [facility] [address] [place], [place], [place]  [address] Phone: [phone]; Fax: [phone]

## 2023-10-19 ENCOUNTER — Other Ambulatory Visit: Payer: Self-pay

## 2023-10-19 ENCOUNTER — Ambulatory Visit (HOSPITAL_COMMUNITY)
Admission: RE | Admit: 2023-10-19 | Discharge: 2023-10-19 | Disposition: A | Discharge status: Home / Self Care | Payer: 59 | Attending: Cardiology | Admitting: Cardiology

## 2023-10-19 ENCOUNTER — Encounter (HOSPITAL_COMMUNITY)
Admission: RE | Disposition: A | Discharge status: Home / Self Care | Payer: Self-pay | Source: Home / Self Care | Attending: Cardiology

## 2023-10-19 ENCOUNTER — Other Ambulatory Visit (HOSPITAL_COMMUNITY): Payer: Self-pay

## 2023-10-19 DIAGNOSIS — Z79899 Other long term (current) drug therapy: Secondary | ICD-10-CM | POA: Diagnosis not present

## 2023-10-19 DIAGNOSIS — E785 Hyperlipidemia, unspecified: Secondary | ICD-10-CM | POA: Diagnosis not present

## 2023-10-19 DIAGNOSIS — E782 Mixed hyperlipidemia: Secondary | ICD-10-CM | POA: Diagnosis present

## 2023-10-19 DIAGNOSIS — Z7982 Long term (current) use of aspirin: Secondary | ICD-10-CM | POA: Diagnosis not present

## 2023-10-19 DIAGNOSIS — I10 Essential (primary) hypertension: Secondary | ICD-10-CM | POA: Diagnosis not present

## 2023-10-19 DIAGNOSIS — I25118 Atherosclerotic heart disease of native coronary artery with other forms of angina pectoris: Secondary | ICD-10-CM | POA: Insufficient documentation

## 2023-10-19 DIAGNOSIS — Z955 Presence of coronary angioplasty implant and graft: Secondary | ICD-10-CM | POA: Insufficient documentation

## 2023-10-19 DIAGNOSIS — E119 Type 2 diabetes mellitus without complications: Secondary | ICD-10-CM

## 2023-10-19 DIAGNOSIS — I2510 Atherosclerotic heart disease of native coronary artery without angina pectoris: Secondary | ICD-10-CM

## 2023-10-19 DIAGNOSIS — I4819 Other persistent atrial fibrillation: Secondary | ICD-10-CM | POA: Diagnosis present

## 2023-10-19 DIAGNOSIS — Z7901 Long term (current) use of anticoagulants: Secondary | ICD-10-CM | POA: Insufficient documentation

## 2023-10-19 DIAGNOSIS — Z7902 Long term (current) use of antithrombotics/antiplatelets: Secondary | ICD-10-CM | POA: Diagnosis not present

## 2023-10-19 DIAGNOSIS — G4733 Obstructive sleep apnea (adult) (pediatric): Secondary | ICD-10-CM | POA: Insufficient documentation

## 2023-10-19 DIAGNOSIS — D6869 Other thrombophilia: Secondary | ICD-10-CM | POA: Diagnosis not present

## 2023-10-19 DIAGNOSIS — I480 Paroxysmal atrial fibrillation: Secondary | ICD-10-CM | POA: Diagnosis present

## 2023-10-19 DIAGNOSIS — I2584 Coronary atherosclerosis due to calcified coronary lesion: Secondary | ICD-10-CM | POA: Insufficient documentation

## 2023-10-19 HISTORY — PX: LEFT HEART CATH AND CORONARY ANGIOGRAPHY: CATH118249

## 2023-10-19 HISTORY — PX: CORONARY STENT INTERVENTION: CATH118234

## 2023-10-19 HISTORY — PX: CORONARY LITHOTRIPSY: CATH118330

## 2023-10-19 LAB — POCT ACTIVATED CLOTTING TIME
Activated Clotting Time: 308 s
Activated Clotting Time: 268 s

## 2023-10-19 SURGERY — LEFT HEART CATH AND CORONARY ANGIOGRAPHY
Anesthesia: LOCAL

## 2023-10-19 MED ORDER — METOPROLOL TARTRATE 25 MG PO TABS: 25.0000 mg | ORAL_TABLET | Freq: Two times a day (BID) | ORAL | Status: DC

## 2023-10-19 MED ORDER — NITROGLYCERIN 0.4 MG SL SUBL
0.4000 mg | SUBLINGUAL_TABLET | SUBLINGUAL | 3 refills | Status: AC | PRN
  Filled 2023-10-19: qty 25, 5d supply, fill #0

## 2023-10-19 MED ORDER — LIDOCAINE HCL (PF) 1 % IJ SOLN
INTRAMUSCULAR | Status: AC
  Filled 2023-10-19: qty 30

## 2023-10-19 MED ORDER — SODIUM CHLORIDE 0.9 % IV SOLN: 250.0000 mL | INTRAVENOUS | Status: DC | PRN

## 2023-10-19 MED ORDER — ONDANSETRON HCL 4 MG/2ML IJ SOLN: 4.0000 mg | Freq: Four times a day (QID) | INTRAMUSCULAR | Status: DC | PRN

## 2023-10-19 MED ORDER — SODIUM CHLORIDE 0.9% FLUSH: 3.0000 mL | INTRAVENOUS | Status: DC | PRN

## 2023-10-19 MED ORDER — CLOPIDOGREL BISULFATE 300 MG PO TABS
ORAL_TABLET | ORAL | Status: AC
  Filled 2023-10-19: qty 2

## 2023-10-19 MED ORDER — ASPIRIN 81 MG PO TBEC
81.0000 mg | DELAYED_RELEASE_TABLET | Freq: Every day | ORAL | 0 refills | Status: AC
Start: 2023-10-19 — End: 2023-11-18
  Filled 2023-10-19: qty 30, 30d supply, fill #0

## 2023-10-19 MED ORDER — LEVOTHYROXINE SODIUM 125 MCG PO TABS: 250.0000 ug | ORAL_TABLET | Freq: Every day | ORAL | Status: DC

## 2023-10-19 MED ORDER — LIDOCAINE HCL (PF) 1 % IJ SOLN
INTRAMUSCULAR | Status: DC | PRN
  Administered 2023-10-19: 2 mL

## 2023-10-19 MED ORDER — CLOPIDOGREL BISULFATE 300 MG PO TABS
ORAL_TABLET | ORAL | Status: DC | PRN
  Administered 2023-10-19: 600 mg via ORAL

## 2023-10-19 MED ORDER — APIXABAN 5 MG PO TABS: 5.0000 mg | ORAL_TABLET | Freq: Two times a day (BID) | ORAL | Status: DC

## 2023-10-19 MED ORDER — VERAPAMIL HCL 2.5 MG/ML IV SOLN
INTRAVENOUS | Status: AC
  Filled 2023-10-19: qty 2

## 2023-10-19 MED ORDER — ROSUVASTATIN CALCIUM 20 MG PO TABS
20.0000 mg | ORAL_TABLET | Freq: Every day | ORAL | 3 refills | Status: DC
  Filled 2023-10-19: qty 90, 90d supply, fill #0

## 2023-10-19 MED ORDER — SODIUM CHLORIDE 0.9 % WEIGHT BASED INFUSION: 1.0000 mL/kg/h | INTRAVENOUS | Status: DC

## 2023-10-19 MED ORDER — DILTIAZEM HCL ER 60 MG PO CP12: 60.0000 mg | ORAL_CAPSULE | Freq: Two times a day (BID) | ORAL | Status: DC

## 2023-10-19 MED ORDER — HEPARIN SODIUM (PORCINE) 1000 UNIT/ML IJ SOLN
INTRAMUSCULAR | Status: DC | PRN
  Administered 2023-10-19: 6000 [IU] via INTRAVENOUS
  Administered 2023-10-19: 2000 [IU] via INTRAVENOUS
  Administered 2023-10-19: 6000 [IU] via INTRAVENOUS

## 2023-10-19 MED ORDER — FENTANYL CITRATE (PF) 100 MCG/2ML IJ SOLN
INTRAMUSCULAR | Status: AC
  Filled 2023-10-19: qty 2

## 2023-10-19 MED ORDER — HEPARIN SODIUM (PORCINE) 1000 UNIT/ML IJ SOLN
INTRAMUSCULAR | Status: AC
  Filled 2023-10-19: qty 10

## 2023-10-19 MED ORDER — MIDAZOLAM HCL 2 MG/2ML IJ SOLN
INTRAMUSCULAR | Status: DC | PRN
  Administered 2023-10-19: 1 mg via INTRAVENOUS
  Administered 2023-10-19: 2 mg via INTRAVENOUS

## 2023-10-19 MED ORDER — PRAVASTATIN SODIUM 40 MG PO TABS: 40.0000 mg | ORAL_TABLET | Freq: Every day | ORAL | Status: DC

## 2023-10-19 MED ORDER — ACETAMINOPHEN 325 MG PO TABS: 650.0000 mg | ORAL_TABLET | ORAL | Status: DC | PRN

## 2023-10-19 MED ORDER — SODIUM CHLORIDE 0.9 % WEIGHT BASED INFUSION: 3.0000 mL/kg/h | INTRAVENOUS | Status: DC

## 2023-10-19 MED ORDER — BENAZEPRIL HCL 20 MG PO TABS: 20.0000 mg | ORAL_TABLET | Freq: Every day | ORAL | Status: DC

## 2023-10-19 MED ORDER — HEPARIN (PORCINE) IN NACL 1000-0.9 UT/500ML-% IV SOLN
INTRAVENOUS | Status: DC | PRN
  Administered 2023-10-19 (×3): 500 mL

## 2023-10-19 MED ORDER — IOHEXOL 350 MG/ML SOLN
INTRAVENOUS | Status: DC | PRN
  Administered 2023-10-19: 210 mL

## 2023-10-19 MED ORDER — MIDAZOLAM HCL 2 MG/2ML IJ SOLN
INTRAMUSCULAR | Status: AC
  Filled 2023-10-19: qty 2

## 2023-10-19 MED ORDER — SODIUM CHLORIDE 0.9% FLUSH: 3.0000 mL | Freq: Two times a day (BID) | INTRAVENOUS | Status: DC

## 2023-10-19 MED ORDER — ASPIRIN 81 MG PO CHEW: 81.0000 mg | CHEWABLE_TABLET | ORAL | Status: DC

## 2023-10-19 MED ORDER — VERAPAMIL HCL 2.5 MG/ML IV SOLN
INTRAVENOUS | Status: DC | PRN
  Administered 2023-10-19: 10 mL via INTRA_ARTERIAL

## 2023-10-19 MED ORDER — FENTANYL CITRATE (PF) 100 MCG/2ML IJ SOLN
INTRAMUSCULAR | Status: DC | PRN
  Administered 2023-10-19 (×2): 25 ug via INTRAVENOUS

## 2023-10-19 MED ORDER — CLOPIDOGREL BISULFATE 75 MG PO TABS: 75.0000 mg | ORAL_TABLET | Freq: Every day | ORAL | Status: DC

## 2023-10-19 MED ORDER — CLOPIDOGREL BISULFATE 75 MG PO TABS
75.0000 mg | ORAL_TABLET | Freq: Every day | ORAL | 3 refills | Status: DC
  Filled 2023-10-19: qty 90, 90d supply, fill #0

## 2023-10-19 MED ORDER — ASPIRIN 81 MG PO CHEW: 81.0000 mg | CHEWABLE_TABLET | Freq: Every day | ORAL | Status: DC

## 2023-10-19 SURGICAL SUPPLY — 22 items
BALL SAPPHIRE NC24 2.75X10 (BALLOONS) ×1
BALLN EMERGE MR 2.5X12 (BALLOONS) ×1
BALLOON EMERGE MR 2.5X12 (BALLOONS) IMPLANT
BALLOON SAPPHIRE NC24 2.75X10 (BALLOONS) IMPLANT
CATH 5FR JL3.5 JR4 ANG PIG MP (CATHETERS) IMPLANT
CATH SHOCKWAVE C2 2.5X12 (CATHETERS) IMPLANT
CATH VISTA GUIDE 6FR XBLD 3.5 (CATHETERS) IMPLANT
DEVICE RAD COMP TR BAND LRG (VASCULAR PRODUCTS) IMPLANT
ELECT DEFIB PAD ADLT CADENCE (PAD) IMPLANT
GLIDESHEATH SLEND SS 6F .021 (SHEATH) IMPLANT
GUIDEWIRE INQWIRE 1.5J.035X260 (WIRE) IMPLANT
INQWIRE 1.5J .035X260CM (WIRE) ×1
KIT ENCORE 26 ADVANTAGE (KITS) IMPLANT
PACK CARDIAC CATHETERIZATION (CUSTOM PROCEDURE TRAY) ×1 IMPLANT
SET ATX-X65L (MISCELLANEOUS) IMPLANT
SHEATH PROBE COVER 6X72 (BAG) IMPLANT
STENT SYNERGY XD 2.75X12 (Permanent Stent) IMPLANT
STOPCOCK MORSE 400PSI 3WAY (MISCELLANEOUS) IMPLANT
SYNERGY XD 2.75X12 (Permanent Stent) ×1 IMPLANT
TUBING CIL FLEX 10 FLL-RA (TUBING) IMPLANT
WIRE ASAHI PROWATER 180CM (WIRE) IMPLANT
WIRE FIGHTER CROSSING 190CM (WIRE) IMPLANT

## 2023-10-19 NOTE — Discharge Summary (Addendum)
 Discharge Summary for Same Day PCI   Patient ID: Randy [name] MRN: [id number]; DOB: 01-May-1961  Admit date: 10/19/2023 Discharge date: 10/19/2023  Primary Care Provider: Garald Karlynn GAILS, MD  Primary Cardiologist: Vina Gull, MD  Primary Electrophysiologist:  Soyla Gladis Norton, MD   Discharge Diagnoses    Principal Problem:   CAD (coronary artery disease) Active Problems:   Essential hypertension   Mixed hyperlipidemia   Type 2 diabetes mellitus (HCC)   PAF (paroxysmal atrial fibrillation) (HCC)   Hypercoagulable state due to persistent atrial fibrillation [facility])    Diagnostic Studies/Procedures    Cardiac Catheterization 10/19/2023:    Mid LAD lesion is 99% stenosed.   A drug-eluting stent was successfully placed using a SYNERGY XD 2.75X12.   Post intervention, there is a 0% residual stenosis.   LV end diastolic pressure is normal.   Recommend to resume Apixaban , at currently prescribed dose and frequency on 10/19/2023.   Recommend concurrent antiplatelet therapy of Aspirin  81 mg for 1 month and Clopidogrel  75mg  daily for 6 months .   Single vessel occlusive CAD involving the mid LAD Normal LVEDP Successful PCI of the mid LAD with Shockwave lithotripsy and stenting with DES   Plan: may resume Eliquis  this evening. ASA 81 mg for one month and Plavix  75 mg daily for 6 months. Anticipate same day DC   Diagnostic Dominance: Right  Intervention   _____________   History of Present Illness     Randy [name] is a 63 y.o. male with with a history of HTN, HL, T2DM, OSA (using CPAP), atrial fibrillation and CAD by CT scan.     He established with Dr. Gull 10/2022 after hospitalization. He had been admitted to [facility] for a leg infection   Found to be in atrial fib   Placed on diltiazem  and Eliquis    Pt noted that his energy was down   Got winded with stairs He was set up for DCCV   He converted x 2 to SR but quickly reverted to afib each time. Pt set up for afib  ablation   Pre-ablation cardiac CT showed Ca score of 2312. Pt underwent afib ablation on 09/07/23 and now feels much better in SR.  Pt set up for Lexiscan  PET / CT   This was done on 10/12/23  This showed a medium sized severe reversible defect in the mid septum to apex   LVEF was mildly reduced with a wall motion abnormality in this region   Global MBFR was reduced at 1.83          The pt denies CP   Again, energy improved since ablation.   Cardiac catheterization was arranged for further evaluation.  Hospital Course     The patient underwent cardiac cath as noted above with mid LAD stenosis of 99% successfully treated with PCI/shockwave lithotripsy and stenting with 2.75 x 12 mm DES. Plan for DAPT with ASA/Plavix .  Given need for Eliquis , which will be restarted this evening, will stop aspirin  after 1 month, continue Plavix  for 6 months. The patient was seen by cardiac rehab while in short stay. There were no observed complications post cath. Radial cath site was re-evaluated prior to discharge and found to be stable without any complications. Instructions/precautions regarding cath site care were given prior to discharge.  Randy [name] was seen by Dr. Jordan and determined stable for discharge home. Follow up with our office has been arranged. Medications are listed below. Pertinent changes include  new Plavix .  Resume Eliquis  this evening. Add SL NTG and change statin to crestor .    _____________  Cath/PCI Registry Performance & Quality Measures: Aspirin  prescribed? - Yes ADP Receptor Inhibitor (Plavix /Clopidogrel , Brilinta/Ticagrelor or Effient/Prasugrel) prescribed (includes medically managed patients)? - Yes High Intensity Statin (Lipitor 40-80mg  or Crestor  20-40mg ) prescribed? - Yes For EF <40%, was ACEI/ARB prescribed? - Yes For EF <40%, Aldosterone Antagonist (Spironolactone or Eplerenone) prescribed? - Not Applicable (EF >/= 40%) Cardiac Rehab Phase II ordered (Included Medically  managed Patients)? - Yes  _____________   Discharge Vitals Blood pressure 119/72, pulse 79, temperature 98.4 F (36.9 C), temperature source Oral, resp. rate (!) 21, height 6' (1.829 m), weight (!) 136.5 kg, SpO2 98%.  Filed Weights   10/19/23 0901  Weight: (!) 136.5 kg    Last Labs & Radiologic Studies    CBC Recent Labs    10/17/23 1657  WBC 4.9  HGB 14.7  HCT 42.9  MCV 92  PLT 243   Basic Metabolic Panel Recent Labs    97/95/74 1657  NA 138  K 4.0  CL 100  CO2 23  GLUCOSE 86  BUN 17  CREATININE 1.06  CALCIUM  9.2   Liver Function Tests No results for input(s): AST, ALT, ALKPHOS, BILITOT, PROT, ALBUMIN in the last 72 hours. No results for input(s): LIPASE, AMYLASE in the last 72 hours. High Sensitivity Troponin:   No results for input(s): TROPONINIHS in the last 720 hours.  BNP Invalid input(s): POCBNP D-Dimer No results for input(s): DDIMER in the last 72 hours. Hemoglobin A1C No results for input(s): HGBA1C in the last 72 hours. Fasting Lipid Panel No results for input(s): CHOL, HDL, LDLCALC, TRIG, CHOLHDL, LDLDIRECT in the last 72 hours. Thyroid  Function Tests No results for input(s): TSH, T4TOTAL, T3FREE, THYROIDAB in the last 72 hours.  Invalid input(s): FREET3 _____________  CARDIAC CATHETERIZATION Result Date: 10/19/2023   Mid LAD lesion is 99% stenosed.   A drug-eluting stent was successfully placed using a SYNERGY XD 2.75X12.   Post intervention, there is a 0% residual stenosis.   LV end diastolic pressure is normal.   Recommend to resume Apixaban , at currently prescribed dose and frequency on 10/19/2023.   Recommend concurrent antiplatelet therapy of Aspirin  81 mg for 1 month and Clopidogrel  75mg  daily for 6 months . Single vessel occlusive CAD involving the mid LAD Normal LVEDP Successful PCI of the mid LAD with Shockwave lithotripsy and stenting with DES Plan: may resume Eliquis  this evening. ASA 81 mg  for one month and Plavix  75 mg daily for 6 months. Anticipate same day DC   NM PET CT CARDIAC PERFUSION MULTI W/ABSOLUTE BLOODFLOW Result Date: 10/13/2023   Medium size, severe, reversible defect in the mid septum into apex consistent with LAD ischemia. WMA in this region is present with stress also consistent with ischemia. LVEF is mildly reduced, but normal response to stress. MBFR is severely reduced in this defect (1.08). Global MBFR is reduced at 1.83, raising concerns for possible multi-vessel CAD. Ovearll, high risk study with concerns for LAD ischemia and possibly multi-vessel CAD.   LV perfusion is abnormal. There is evidence of ischemia. There is no evidence of infarction. Defect 1: There is a medium defect with severe reduction in uptake present in the apical to mid septal and apex location(s) that is reversible. There is abnormal wall motion in the defect area. Consistent with ischemia.   Rest left ventricular function is abnormal. Rest global function is mildly reduced. There were  no regional wall motion abnormalities. Rest EF: 48%. Stress left ventricular function is normal. Stress EF: 53%. End diastolic cavity size is moderately enlarged.   Myocardial blood flow was computed to be 0.54ml/g/min at rest and 1.90ml/g/min at stress. Global myocardial blood flow reserve was 1.83 and was mildly abnormal.   Coronary calcium  was present on the attenuation correction CT images. Severe coronary calcifications were present. Coronary calcifications were present in the left anterior descending artery, left circumflex artery and right coronary artery distribution(s).   Findings are consistent with ischemia. The study is high risk.   Electronically signed by Darryle Decent, MD CLINICAL DATA:  This over-read does not include interpretation of cardiac or coronary anatomy or pathology. The Cardiac PET CT interpretation by the cardiologist is attached. COMPARISON:  CT coronary angiogram, 08/16/2023 FINDINGS:  Cardiovascular: Aortic atherosclerosis. Cardiomegaly. Three-vessel coronary artery calcifications. No pericardial effusion. Limited Mediastinum/Nodes: No enlarged mediastinal, hilar, or axillary lymph nodes. Small hiatal hernia. Trachea and esophagus demonstrate no significant findings. Limited Lungs/Pleura: Lungs are clear. No pleural effusion or pneumothorax. Upper Abdomen: No acute abnormality. Musculoskeletal: No chest wall abnormality. No acute osseous findings. IMPRESSION: 1. No acute CT findings of the included chest. 2. Cardiomegaly and coronary artery disease. Aortic Atherosclerosis (ICD10-I70.0). Electronically Signed   By: Marolyn JONETTA Jaksch M.D.   On: 10/12/2023 12:07   Disposition   Pt is being discharged home today in good condition.  Follow-up Plans & Appointments     Discharge Instructions     Amb Referral to Cardiac Rehabilitation   Complete by: As directed    Diagnosis: Coronary Stents   After initial evaluation and assessments completed: Virtual Based Care may be provided alone or in conjunction with Phase 2 Cardiac Rehab based on patient barriers.: Yes   Intensive Cardiac Rehabilitation (ICR) [facility] location only OR Traditional Cardiac Rehabilitation (TCR) *If criteria for ICR are not met will enroll in TCR [facility] only): Yes        Discharge Medications   Allergies as of 10/19/2023       Reactions   Penicillins Other (See Comments)   Family history of allergic reaction    Watermelon [citrullus Vulgaris] Itching, Other (See Comments)   Makes throat itch         Medication List     STOP taking these medications    lovastatin  40 MG tablet Commonly known as: MEVACOR        TAKE these medications    acetaminophen  325 MG tablet Commonly known as: TYLENOL  Take 650 mg by mouth in the morning and at bedtime.   apixaban  5 MG Tabs tablet Commonly known as: ELIQUIS  Take 1 tablet (5 mg total) by mouth 2 (two) times daily. Notes to patient: Restart Eliquis  tonight  2/6   aspirin  EC 81 MG tablet Take 1 tablet (81 mg total) by mouth daily. Swallow whole. Stop after 30 days.   B-12 2000 MCG Tabs Take 2,000 mcg by mouth every morning.   benazepril  20 MG tablet Commonly known as: LOTENSIN  Take 1 tablet (20 mg total) by mouth daily.   clopidogrel  75 MG tablet Commonly known as: PLAVIX  Take 1 tablet (75 mg total) by mouth daily with breakfast. Start taking on: October 20, 2023   CoQ10 100 MG Caps Take 100 mg by mouth every morning.   diltiazem  60 MG 12 hr capsule Commonly known as: CARDIZEM  SR Take 1 capsule (60 mg total) by mouth every 12 (twelve) hours.   levothyroxine  125 MCG tablet Commonly known as: SYNTHROID   Take 2 tablets (250 mcg total) by mouth daily before breakfast. TAKE 2 TABLETS BY MOUTH BEFORE BREAKFAST .   metoprolol  tartrate 25 MG tablet Commonly known as: LOPRESSOR  Take 1 tablet (25 mg total) by mouth 2 (two) times daily.   multivitamin tablet Take 1 tablet by mouth daily with breakfast.   nitroGLYCERIN  0.4 MG SL tablet Commonly known as: Nitrostat  Place 1 tablet (0.4 mg total) under the tongue every 5 (five) minutes as needed for chest pain.   OVER THE COUNTER MEDICATION Take 2 capsules by mouth in the morning and at bedtime. Six Star Testosterone  Booster Has calcium , sodium, rodeola extract, gingko extract, and citrate   rosuvastatin  20 MG tablet Commonly known as: Crestor  Take 1 tablet (20 mg total) by mouth daily.   Rybelsus  14 MG Tabs Generic drug: Semaglutide  Take 1 tablet by mouth once daily   triamterene -hydrochlorothiazide  37.5-25 MG tablet Commonly known as: MAXZIDE -25 Take 1 tablet by mouth daily.   vitamin C  with rose hips 1000 MG tablet Take 1,000 mg by mouth daily in the afternoon.   Vitamin D3 50 MCG (2000 UT) Tabs Take 2,000 Units by mouth every evening.           Allergies Allergies  Allergen Reactions   Penicillins Other (See Comments)    Family history of allergic reaction     Watermelon [Citrullus Vulgaris] Itching and Other (See Comments)    Makes throat itch     Outstanding Labs/Studies     Duration of Discharge Encounter   Greater than 30 minutes including physician time.  Signed, Jon Nat Hails, PA 10/19/2023, 12:49 PM

## 2023-10-19 NOTE — Progress Notes (Signed)
 Pt was educated on stent card, stent location, Antiplatelet and ASA use, wt restrictions, no baths/daily wash-ups, s/s of infection, ex guidelines, s/s to stop exercising, NTG use and calling 911, heart healthy diet, risk factors and CRPII. Pt received materials on exercise, diet, and CRPII. Will refer to [facility].   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  Randy [name] 10/19/2023 12:47 PM

## 2023-10-19 NOTE — Progress Notes (Addendum)
 Patient noticed to have ozzy bleeding from right radial TR band, MD notified. TR band removed and replaced by cath lab zach, 11 cc are replaced in TR band.

## 2023-10-19 NOTE — Interval H&P Note (Signed)
 History and Physical Interval Note:  10/19/2023 9:33 AM  Randy [name]  has presented today for surgery, with the diagnosis of chest pain.  The various methods of treatment have been discussed with the patient and family. After consideration of risks, benefits and other options for treatment, the patient has consented to  Procedure(s): LEFT HEART CATH AND CORONARY ANGIOGRAPHY (N/A) as a surgical intervention.  The patient's history has been reviewed, patient examined, no change in status, stable for surgery.  I have reviewed the patient's chart and labs.  Questions were answered to the patient's satisfaction.   Cath Lab Visit (complete for each Cath Lab visit)  Clinical Evaluation Leading to the Procedure:   ACS: No.  Non-ACS:    Anginal Classification: CCS I  Anti-ischemic medical therapy: Maximal Therapy (2 or more classes of medications)  Non-Invasive Test Results: High-risk stress test findings: cardiac mortality >3%/year  Prior CABG: No previous CABG        Randy [name] [facility] 10/19/2023 9:33 AM

## 2023-10-19 NOTE — Progress Notes (Addendum)
 TR band removed at 1830. Gauze dressing applied. Right radial level 0, clean, dry, and intact.Patient walked to the bathroom without difficulties.

## 2023-10-19 NOTE — Progress Notes (Signed)
 Went to short stay to assess the right radial TR band. The band was noted to have blood underneath. To be able to better visualize the site, TR band was replaced to confirm placement. Blood pressure cuff was placed and manual pressure was held to radial artery to replace band. Band replaced. Site not noted to bleed back upon placing new band and releasing air. Band with 11CC's of air, pulse present and Barbeau type A. Dr. Jordan made aware of site not bleeding back and band replacement.

## 2023-10-20 ENCOUNTER — Encounter (HOSPITAL_COMMUNITY): Payer: Self-pay | Admitting: Cardiology

## 2023-10-20 NOTE — Progress Notes (Signed)
 See patient note from same day

## 2023-10-25 NOTE — Progress Notes (Unsigned)
Cardiology Office Note    [name] Name: Randy [name] Date of Encounter: 10/26/2023  Primary Care Provider:  Tresa Garter, MD Primary Cardiologist:  Dietrich Pates, MD Primary Electrophysiologist: [name], MD   Past Medical History    Past Medical History:  Diagnosis Date   At risk for sleep apnea    STOP-BANG= 6         SENT TO PCP 06-24-2016   Back pain    History of thyroid nodule    s/p  right thyroid lopectomy--  per path report:  follicular adenoma, chronic thyroiditis   Hyperlipidemia    Hypertension    Hypogonadism male    Hypothyroidism    Obesity    OSA on CPAP    moderate OSA with an AHI of 17.5/h on auto CPAP   Other specified disorders of tendon, right knee    quad tendon tear   PAF (paroxysmal atrial fibrillation) (HCC)    SOB (shortness of breath)    Vitamin D deficiency    Wears glasses     History of Present Illness  Randy [name] is a 64 y.o. male with a PMH CAD s/p 99% stenosed mid LAD treated with DES x 1 paroxysmal AF, DM type II, hypothyroidism s/p partial thyroidectomy, morbid obesity, OSA (on CPAP) who presents today for post PCI follow-up.  Randy [name] was seen initially by Dr. Tenny Craw and 06/2023 after being diagnosed with AF with RVR after being evaluated for left leg swelling and cellulitis.  He was started on rate control with Cardizem and Eliquis 5 mg.  TTE was completed showing EF of 50-55% with mild asymmetric LVH and severely dilated LA/moderately dilated RA with mild MVR and mild AV calcification. During his initial visit [name] reported that he gets winded with climbing Randy stairs but denied any palpitations.  He was set up for DCCV that was completed on 07/27/2023 but could not maintain sinus rhythm with quick return to AF.  He was referred to EP and was seen by Dr. Elberta Fortis on 12 /3/24 and agreed to undergo AF ablation.  Procedure was completed on 09/07/2023 and was seen in AF clinic for follow-up on 10/02/2023 and was maintaining  sinus rhythm.  He completed a calcium score prior to AF ablation that showed high calcium score and underwent PET stress test on 1/30 that showed severe reversible defect in Randy mid septum to apex.  He was set up for outpatient LHC that was performed on 10/19/2023 that revealed 99% stenosed mid LAD lesion that was treated with shockwave lithotripsy and DES x 1.  [name] was started on Plavix 75 mg and ASA 81 mg for 6 months with plan to discontinue ASA in 1 month.  Randy [name] presents today for post PCI follow-up.  He reports no chest pain or palpitations since his heart catheterization. Randy [name] has noticed a tingling sensation in Randy left hip when lying flat, which has started around Randy same time as Randy heart procedure. Randy [name] has a history of back issues, including a back infection that spread from a leg infection, and some narrowing and fractures in Randy spine. Randy [name] also reports occasional lower back discomfort after reading or slouching in bed for a few days.  During today's visit we reviewed his heart cath pictures discussed Randy importance of lifestyle modification and reduction of risk factors.  He had all questions answered to his satisfaction denied any medication pressure reactions.  His blood pressure today was controlled  at 132/82.  [name] denies chest pain, palpitations, dyspnea, PND, orthopnea, nausea, vomiting, dizziness, syncope, edema, weight gain, or early satiety.  Review of Systems  Please see Randy history of present illness.    All other systems reviewed and are otherwise negative except as noted above.  Physical Exam    Wt Readings from Last 3 Encounters:  10/26/23 (!) 304 lb 3.2 oz (138 kg)  10/19/23 (!) 301 lb (136.5 kg)  10/17/23 (!) 301 lb (136.5 kg)   VS: Vitals:   10/26/23 1113  BP: 132/82  Pulse: 87  SpO2: 96%  ,Body mass index is 41.26 kg/m. GEN: Well nourished, well developed in no acute distress Neck: No JVD; No carotid bruits Pulmonary: Clear  to auscultation without rales, wheezing or rhonchi  Cardiovascular: Normal rate. Regular rhythm. Normal S1. Normal S2.  Right radial site clean dry and intact with no evidence of hematoma or ecchymosis present. Murmurs: There is no murmur.  ABDOMEN: Soft, non-tender, non-distended EXTREMITIES:  No edema; No deformity   EKG/LABS/ Recent Cardiac Studies   ECG personally reviewed by me today -sinus rhythm with PVCs and PACs and rate of 83 with no acute changes consistent with previous EKG.  Risk Assessment/Calculations:    CHA2DS2-VASc Score = 3   This indicates a 3.2% annual risk of stroke. Randy [name]'s score is based upon: CHF History: 0 HTN History: 1 Diabetes History: 1 Stroke History: 0 Vascular Disease History: 1 Age Score: 0 Gender Score: 0     STOP-Bang Score:  7      Lab Results  Component Value Date   WBC 4.9 10/17/2023   HGB 14.7 10/17/2023   HCT 42.9 10/17/2023   MCV 92 10/17/2023   PLT 243 10/17/2023   Lab Results  Component Value Date   CREATININE 1.06 10/17/2023   BUN 17 10/17/2023   NA 138 10/17/2023   K 4.0 10/17/2023   CL 100 10/17/2023   CO2 23 10/17/2023   Lab Results  Component Value Date   CHOL 110 02/08/2023   HDL 32.50 (L) 02/08/2023   LDLCALC 64 02/08/2023   TRIG 67.0 02/08/2023   CHOLHDL 3 02/08/2023    Lab Results  Component Value Date   HGBA1C 5.3 02/08/2023   Assessment & Plan    1.  Coronary artery disease: -s/p LHC with mid LAD 99% stenosis treated with shockwave protriptyline and DES x 1 with triple therapy of ASA 81 mg, Plavix 75 mg and Eliquis with plan to discontinue ASA on 11/16/2023 -Today [name] reports no chest pain or shortness of breath since heart catheterization procedure. -He is currently working full-time as a Runner, broadcasting/film/video and is unable to accommodate his scheduled for cardiac rehab at this time. -Continue current GDMT with ASA 81 mg, Plavix 75 mg, metoprolol 25 mg twice daily, Nitrostat 0.4 mg as needed, Crestor 20 mg  daily  2.  Paroxysmal AF: -[name] currently on metoprolol 25 mg twice daily and Cardizem 60 mg daily for rate control -Most recent hemoglobin was 13.6 and creatinine was 1.06 -Continue Eliquis 5 mg twice daily  3.  Primary hypertension: -Blood pressure today was 132/82 -Continue metoprolol 25 mg twice daily, Cardizem 60 mg every 12 hours, Maxide 37.5-25 daily  4.  Hyperlipidemia: -[name]'s last LDL cholesterol was 52 -Continue Crestor 20 mg daily -We will check LFTs and lipids in 8 weeks  5.  Obstructive sleep apnea: -[name] reports ongoing compliance at this time    Cardiac Rehabilitation Eligibility Assessment  Randy [name] has  declined or is not appropriate for cardiac rehabilitation. 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

## 2023-10-26 ENCOUNTER — Encounter: Payer: Self-pay | Admitting: Nurse Practitioner

## 2023-10-26 ENCOUNTER — Ambulatory Visit: Payer: 59 | Attending: Nurse Practitioner | Admitting: Nurse Practitioner

## 2023-10-26 VITALS — BP 132/82 | HR 87 | Ht 72.0 in | Wt 304.2 lb

## 2023-10-26 DIAGNOSIS — E785 Hyperlipidemia, unspecified: Secondary | ICD-10-CM

## 2023-10-26 DIAGNOSIS — G4733 Obstructive sleep apnea (adult) (pediatric): Secondary | ICD-10-CM | POA: Diagnosis not present

## 2023-10-26 DIAGNOSIS — I2510 Atherosclerotic heart disease of native coronary artery without angina pectoris: Secondary | ICD-10-CM

## 2023-10-26 DIAGNOSIS — I10 Essential (primary) hypertension: Secondary | ICD-10-CM

## 2023-10-26 DIAGNOSIS — I480 Paroxysmal atrial fibrillation: Secondary | ICD-10-CM | POA: Diagnosis not present

## 2023-10-26 NOTE — Patient Instructions (Signed)
Medication Instructions:  Stop aspirin on 11/16/23 *If you need a refill on your cardiac medications before your next appointment, please call your pharmacy*  Lab Work: Fasting lipids and lft's in 8 weeks You will need to go to any [facility] for these labs. There is a [facility] in our building on the 1st floor or you can call [phone] or visit [email or web address] to find a lab near you. - You do NOT need an appointment for this. You do NOT need to be fasting. We NO LONGER have a lab located in our office.  If you have labs (blood work) drawn today and your tests are completely normal, you will receive your results only by: MyChart Message (if you have MyChart) OR A paper copy in the mail If you have any lab test that is abnormal or we need to change your treatment, we will call you to review the results.  Follow-Up: At [facility], you and your health needs are our priority.  As part of our continuing mission to provide you with exceptional heart care, we have created designated Provider Care Teams.  These Care Teams include your primary Cardiologist (physician) and Advanced Practice Providers (APPs -  Physician Assistants and Nurse Practitioners) who all work together to provide you with the care you need, when you need it.  Your next appointment:   3 month(s)  Provider:   Dietrich Pates, MD  or Robin Searing, NP        Other Instructions Have your children get a calcium score and lab-Lp(a) (lipoprotein a)  Low-Sodium Eating Plan Salt (sodium) helps you keep a healthy balance of fluids in your body. Too much sodium can raise your blood pressure. It can also cause fluid and waste to be held in your body. Your health care provider or dietitian may recommend a low-sodium eating plan if you have high blood pressure (hypertension), kidney disease, liver disease, or heart failure. Eating less sodium can help lower your blood pressure and reduce swelling. It can also protect your heart, liver, and  kidneys. What are tips for following this plan? Reading food labels  Check food labels for the amount of sodium per serving. If you eat more than one serving, you must multiply the listed amount by the number of servings. Choose foods with less than 140 milligrams (mg) of sodium per serving. Avoid foods with 300 mg of sodium or more per serving. Always check how much sodium is in a product, even if the label says "unsalted" or "no salt added." Shopping  Buy products labeled as "low-sodium" or "no salt added." Buy fresh foods. Avoid canned foods and pre-made or frozen meals. Avoid canned, cured, or processed meats. Buy breads that have less than 80 mg of sodium per slice. Cooking  Eat more home-cooked food. Try to eat less restaurant, buffet, and fast food. Try not to add salt when you cook. Use salt-free seasonings or herbs instead of table salt or sea salt. Check with your provider or pharmacist before using salt substitutes. Cook with plant-based oils, such as canola, sunflower, or olive oil. Meal planning When eating at a restaurant, ask if your food can be made with less salt or no salt. Avoid dishes labeled as brined, pickled, cured, or smoked. Avoid dishes made with soy sauce, miso, or teriyaki sauce. Avoid foods that have monosodium glutamate (MSG) in them. MSG may be added to some restaurant food, sauces, soups, bouillon, and canned foods. Make meals that can be grilled, baked, poached,  roasted, or steamed. These are often made with less sodium. General information Try to limit your sodium intake to 1,500-2,300 mg each day, or the amount told by your provider. What foods should I eat? Fruits Fresh, frozen, or canned fruit. Fruit juice. Vegetables Fresh or frozen vegetables. "No salt added" canned vegetables. "No salt added" tomato sauce and paste. Low-sodium or reduced-sodium tomato and vegetable juice. Grains Low-sodium cereals, such as oats, puffed wheat and rice, and  shredded wheat. Low-sodium crackers. Unsalted rice. Unsalted pasta. Low-sodium bread. Whole grain breads and whole grain pasta. Meats and other proteins Fresh or frozen meat, poultry, seafood, and fish. These should have no added salt. Low-sodium canned tuna and salmon. Unsalted nuts. Dried peas, beans, and lentils without added salt. Unsalted canned beans. Eggs. Unsalted nut butters. Dairy Milk. Soy milk. Cheese that is naturally low in sodium, such as ricotta cheese, fresh mozzarella, or Swiss cheese. Low-sodium or reduced-sodium cheese. Cream cheese. Yogurt. Seasonings and condiments Fresh and dried herbs and spices. Salt-free seasonings. Low-sodium mustard and ketchup. Sodium-free salad dressing. Sodium-free light mayonnaise. Fresh or refrigerated horseradish. Lemon juice. Vinegar. Other foods Homemade, reduced-sodium, or low-sodium soups. Unsalted popcorn and pretzels. Low-salt or salt-free chips. The items listed above may not be all the foods and drinks you can have. Talk to a dietitian to learn more. What foods should I avoid? Vegetables Sauerkraut, pickled vegetables, and relishes. Olives. [place] fries. Onion rings. Regular canned vegetables, except low-sodium or reduced-sodium items. Regular canned tomato sauce and paste. Regular tomato and vegetable juice. Frozen vegetables in sauces. Grains Instant hot cereals. Bread stuffing, pancake, and biscuit mixes. Croutons. Seasoned rice or pasta mixes. Noodle soup cups. Boxed or frozen macaroni and cheese. Regular salted crackers. Self-rising flour. Meats and other proteins Meat or fish that is salted, canned, smoked, spiced, or pickled. Precooked or cured meat, such as sausages or meat loaves. Tomasa Blase. Ham. Pepperoni. Hot dogs. Corned beef. Chipped beef. Salt pork. Jerky. Pickled herring, anchovies, and sardines. Regular canned tuna. Salted nuts. Dairy Processed cheese and cheese spreads. Hard cheeses. Cheese curds. Blue cheese. Feta cheese.  String cheese. Regular cottage cheese. Buttermilk. Canned milk. Fats and oils Salted butter. Regular margarine. Ghee. Bacon fat. Seasonings and condiments Onion salt, garlic salt, seasoned salt, table salt, and sea salt. Canned and packaged gravies. Worcestershire sauce. Tartar sauce. Barbecue sauce. Teriyaki sauce. Soy sauce, including reduced-sodium soy sauce. Steak sauce. Fish sauce. Oyster sauce. Cocktail sauce. Horseradish that you find on the shelf. Regular ketchup and mustard. Meat flavorings and tenderizers. Bouillon cubes. Hot sauce. Pre-made or packaged marinades. Pre-made or packaged taco seasonings. Relishes. Regular salad dressings. Salsa. Other foods Salted popcorn and pretzels. Corn chips and puffs. Potato and tortilla chips. Canned or dried soups. Pizza. Frozen entrees and pot pies. The items listed above may not be all the foods and drinks you should avoid. Talk to a dietitian to learn more. This information is not intended to replace advice given to you by your health care provider. Make sure you discuss any questions you have with your health care provider. Document Revised: 09/15/2022 Document Reviewed: 09/15/2022 Elsevier Patient Education  2024 [employer]  Heart-Healthy Eating Plan Many factors influence your heart health, including eating and exercise habits. Heart health is also called coronary health. Coronary risk increases with abnormal blood fat (lipid) levels. A heart-healthy eating plan includes limiting unhealthy fats, increasing healthy fats, limiting salt (sodium) intake, and making other diet and lifestyle changes. What is my plan? Your health care provider may  recommend that: You limit your fat intake to _________% or less of your total calories each day. You limit your saturated fat intake to _________% or less of your total calories each day. You limit the amount of cholesterol in your diet to less than _________ mg per day. You limit the amount of sodium  in your diet to less than _________ mg per day. What are tips for following this plan? Cooking Cook foods using methods other than frying. Baking, boiling, grilling, and broiling are all good options. Other ways to reduce fat include: Removing the skin from poultry. Removing all visible fats from meats. Steaming vegetables in water or broth. Meal planning  At meals, imagine dividing your plate into fourths: Fill one-half of your plate with vegetables and green salads. Fill one-fourth of your plate with whole grains. Fill one-fourth of your plate with lean protein foods. Eat 2-4 cups of vegetables per day. One cup of vegetables equals 1 cup (91 g) broccoli or cauliflower florets, 2 medium carrots, 1 large bell pepper, 1 large sweet potato, 1 large tomato, 1 medium white potato, 2 cups (150 g) raw leafy greens. Eat 1-2 cups of fruit per day. One cup of fruit equals 1 small apple, 1 large banana, 1 cup (237 g) mixed fruit, 1 large orange,  cup (82 g) dried fruit, 1 cup (240 mL) 100% fruit juice. Eat more foods that contain soluble fiber. Examples include apples, broccoli, carrots, beans, peas, and barley. Aim to get 25-30 g of fiber per day. Increase your consumption of legumes, nuts, and seeds to 4-5 servings per week. One serving of dried beans or legumes equals  cup (90 g) cooked, 1 serving of nuts is  oz (12 almonds, 24 pistachios, or 7 walnut halves), and 1 serving of seeds equals  oz (8 g). Fats Choose healthy fats more often. Choose monounsaturated and polyunsaturated fats, such as olive and canola oils, avocado oil, flaxseeds, walnuts, almonds, and seeds. Eat more omega-3 fats. Choose salmon, mackerel, sardines, tuna, flaxseed oil, and ground flaxseeds. Aim to eat fish at least 2 times each week. Check food labels carefully to identify foods with trans fats or high amounts of saturated fat. Limit saturated fats. These are found in animal products, such as meats, butter, and cream.  Plant sources of saturated fats include palm oil, palm kernel oil, and coconut oil. Avoid foods with partially hydrogenated oils in them. These contain trans fats. Examples are stick margarine, some tub margarines, cookies, crackers, and other baked goods. Avoid fried foods. General information Eat more home-cooked food and less restaurant, buffet, and fast food. Limit or avoid alcohol. Limit foods that are high in added sugar and simple starches such as foods made using white refined flour (white breads, pastries, sweets). Lose weight if you are overweight. Losing just 5-10% of your body weight can help your overall health and prevent diseases such as diabetes and heart disease. Monitor your sodium intake, especially if you have high blood pressure. Talk with your health care provider about your sodium intake. Try to incorporate more vegetarian meals weekly. What foods should I eat? Fruits All fresh, canned (in natural juice), or frozen fruits. Vegetables Fresh or frozen vegetables (raw, steamed, roasted, or grilled). Green salads. Grains Most grains. Choose whole wheat and whole grains most of the time. Rice and pasta, including brown rice and pastas made with whole wheat. Meats and other proteins Lean, well-trimmed beef, veal, pork, and lamb. Chicken and [place] without skin. All fish and  shellfish. Wild duck, rabbit, pheasant, and venison. Egg whites or low-cholesterol egg substitutes. Dried beans, peas, lentils, and tofu. Seeds and most nuts. Dairy Low-fat or nonfat cheeses, including ricotta and mozzarella. Skim or 1% milk (liquid, powdered, or evaporated). Buttermilk made with low-fat milk. Nonfat or low-fat yogurt. Fats and oils Non-hydrogenated (trans-free) margarines. Vegetable oils, including soybean, sesame, sunflower, olive, avocado, peanut, safflower, corn, canola, and cottonseed. Salad dressings or mayonnaise made with a vegetable oil. Beverages Water (mineral or sparkling).  Coffee and tea. Unsweetened ice tea. Diet beverages. Sweets and desserts Sherbet, gelatin, and fruit ice. Small amounts of dark chocolate. Limit all sweets and desserts. Seasonings and condiments All seasonings and condiments. The items listed above may not be a complete list of foods and beverages you can eat. Contact a dietitian for more options. What foods should I avoid? Fruits Canned fruit in heavy syrup. Fruit in cream or butter sauce. Fried fruit. Limit coconut. Vegetables Vegetables cooked in cheese, cream, or butter sauce. Fried vegetables. Grains Breads made with saturated or trans fats, oils, or whole milk. Croissants. Sweet rolls. Donuts. High-fat crackers, such as cheese crackers and chips. Meats and other proteins Fatty meats, such as hot dogs, ribs, sausage, bacon, rib-eye roast or steak. High-fat deli meats, such as salami and bologna. Caviar. Domestic duck and goose. Organ meats, such as liver. Dairy Cream, sour cream, cream cheese, and creamed cottage cheese. Whole-milk cheeses. Whole or 2% milk (liquid, evaporated, or condensed). Whole buttermilk. Cream sauce or high-fat cheese sauce. Whole-milk yogurt. Fats and oils Meat fat, or shortening. Cocoa butter, hydrogenated oils, palm oil, coconut oil, palm kernel oil. Solid fats and shortenings, including bacon fat, salt pork, lard, and butter. Nondairy cream substitutes. Salad dressings with cheese or sour cream. Beverages Regular sodas and any drinks with added sugar. Sweets and desserts Frosting. Pudding. Cookies. Cakes. Pies. Milk chocolate or white chocolate. Buttered syrups. Full-fat ice cream or ice cream drinks. The items listed above may not be a complete list of foods and beverages to avoid. Contact a dietitian for more information. Summary Heart-healthy meal planning includes limiting unhealthy fats, increasing healthy fats, limiting salt (sodium) intake and making other diet and lifestyle changes. Lose weight if  you are overweight. Losing just 5-10% of your body weight can help your overall health and prevent diseases such as diabetes and heart disease. Focus on eating a balance of foods, including fruits and vegetables, low-fat or nonfat dairy, lean protein, nuts and legumes, whole grains, and heart-healthy oils and fats. This information is not intended to replace advice given to you by your health care provider. Make sure you discuss any questions you have with your health care provider. Document Revised: 10/04/2021 Document Reviewed: 10/04/2021 Elsevier Patient Education  2024 [employer].

## 2023-10-30 ENCOUNTER — Telehealth (HOSPITAL_COMMUNITY): Payer: Self-pay

## 2023-10-30 NOTE — Telephone Encounter (Signed)
Note from 2/13 f/u appt with NP Hurshel Party:   Cardiac Rehabilitation Eligibility Assessment  The patient has declined or is not appropriate for cardiac rehabilitation. 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

## 2023-11-03 ENCOUNTER — Other Ambulatory Visit: Payer: Self-pay | Admitting: Internal Medicine

## 2023-12-02 ENCOUNTER — Other Ambulatory Visit: Payer: Self-pay | Admitting: Internal Medicine

## 2023-12-05 NOTE — Progress Notes (Unsigned)
 Electrophysiology Office Note:   Date:  12/06/2023  ID:  Randy [name], DOB 20-Oct-1960, MRN [id number]  Primary Cardiologist: Dietrich Pates, MD Primary Heart Failure: None Electrophysiologist: [name], MD      History of Present Illness:   Randy [name] is a 63 y.o. male with h/o AF, HTN, HLD, CAD, OSA on CPAP, hypothyroidism, DM II seen today for routine electrophysiology followup.   Since last being seen in our clinic the patient reports he has been doing very well.  He has recovered from his heart cath and ablation.  Remains on eliquis and plavix at this time.  He reports he was working in the yard and clipping down small branches / trees. He used his abd as a fulcrum and ended up with significant bruising on his abdomen.  He denies issues on plavix / eliquis otherwise. He is a former Animator and now teaches school. He states he feels very good, "like his old self".   He denies chest pain, palpitations, dyspnea, PND, orthopnea, nausea, vomiting, dizziness, syncope, edema, weight gain, or early satiety.   Review of systems complete and found to be negative unless listed in HPI.   EP Information / Studies Reviewed:    EKG is ordered today. Personal review as below.  EKG Interpretation Date/Time:  Wednesday December 06 2023 15:10:53 EDT Ventricular Rate:  77 PR Interval:  178 QRS Duration:  110 QT Interval:  386 QTC Calculation: 436 R Axis:   -8  Text Interpretation: Normal sinus rhythm Confirmed by Canary Brim ([id number]) on 12/06/2023 3:27:55 PM   Studies:  ECHO 05/2023 >  LVEF 50-55%, LA severely dilated, RA mod dilated CT Cardiac Morphology 07/2023 > normal PV drainage into LA, no PFO/ASD, normal coronary origin, right dominance. CAC 2312 which is 98th percentile for matched controls  EPS 08/2023 > SR on presentation, successful electrical isolation and anatomical encircling of all 4 pulmonary veins with PFA, posterior wall isolation using PFA, CTI ablation  LHC  10/19/23 > single vessel CAD involving the mid LAD, normal LVEDP, successful PCI of the mid LAD with shockwave lithotripsy & stenting with DES  Arrhythmia / AAD AF    Risk Assessment/Calculations:    CHA2DS2-VASc Score = 3   This indicates a 3.2% annual risk of stroke. The patient's score is based upon: CHF History: 0 HTN History: 1 Diabetes History: 1 Stroke History: 0 Vascular Disease History: 1 Age Score: 0 Gender Score: 0        STOP-Bang Score:  7       Physical Exam:   VS:  BP 113/68   Pulse 77   Ht 6' (1.829 m)   Wt (!) 302 lb 6.4 oz (137.2 kg)   SpO2 95%   BMI 41.01 kg/m    Wt Readings from Last 3 Encounters:  12/06/23 (!) 302 lb 6.4 oz (137.2 kg)  10/26/23 (!) 304 lb 3.2 oz (138 kg)  10/19/23 (!) 301 lb (136.5 kg)     GEN: Well nourished, well developed in no acute distress NECK: No JVD; No carotid bruits CARDIAC: Regular rate and rhythm, no murmurs, rubs, gallops RESPIRATORY:  Clear to auscultation without rales, wheezing or rhonchi  ABDOMEN: Soft, non-tender, non-distended EXTREMITIES:  trace LE edema; No deformity   ASSESSMENT AND PLAN:    Persistent Atrial Fibrillation  Atrial Flutter  CHA2DS2-VASc 3, s/p PVI/CTI ablation 09/07/23  -continue OAC for stroke prophylaxis  -no symptom burden since ablation   -EKG with NSR   -  stop diltiazem  -continue lopressor 25 mg BID  -pt instructed to call if BP consistently > 130 systolic   Secondary Hypercoagulable State  -continue eliquis 5mg  BID, dose reviewed and appropriate by age / wt   Hypertension  -well controlled on current regimen   OSA  -CPAP compliance encouraged > pt faithful about wearing, uses nasal pillows  -follows with Dr. Mayford Knife   CAD s/p PCI and DES  Elevated CCS on CT at 2312 -follows with Dr. Tenny Craw  -NM PET 10/11/22 high risk study  -LHC 10/19/23 with single vessel CAD s/p PCI of the mid LAD and DES, rec's for ASA 81 mg x1 month, plavix 75mg  x6 months   Follow up with Dr. Elberta Fortis  or EP APP  at the first of August / ~ 5 months    Signed, Canary Brim, NP-C, AGACNP-BC  [facility] - Elect[facility]iology  12/06/2023, 3:59 PM

## 2023-12-06 ENCOUNTER — Ambulatory Visit: Payer: BC Managed Care – PPO | Admitting: Cardiology

## 2023-12-06 ENCOUNTER — Ambulatory Visit: Payer: Self-pay | Attending: Pulmonary Disease | Admitting: Pulmonary Disease

## 2023-12-06 ENCOUNTER — Encounter: Payer: Self-pay | Admitting: Pulmonary Disease

## 2023-12-06 VITALS — BP 113/68 | HR 77 | Ht 72.0 in | Wt 302.4 lb

## 2023-12-06 DIAGNOSIS — G4733 Obstructive sleep apnea (adult) (pediatric): Secondary | ICD-10-CM

## 2023-12-06 DIAGNOSIS — I4819 Other persistent atrial fibrillation: Secondary | ICD-10-CM | POA: Diagnosis not present

## 2023-12-06 DIAGNOSIS — I10 Essential (primary) hypertension: Secondary | ICD-10-CM

## 2023-12-06 DIAGNOSIS — D6869 Other thrombophilia: Secondary | ICD-10-CM

## 2023-12-06 DIAGNOSIS — I2510 Atherosclerotic heart disease of native coronary artery without angina pectoris: Secondary | ICD-10-CM

## 2023-12-06 DIAGNOSIS — Z955 Presence of coronary angioplasty implant and graft: Secondary | ICD-10-CM

## 2023-12-06 NOTE — Patient Instructions (Signed)
 Medication Instructions:  STOP diltiazem (Cardizem) *If you need a refill on your cardiac medications before your next appointment, please call your pharmacy*  Lab Work: None ordered If you have labs (blood work) drawn today and your tests are completely normal, you will receive your results only by: MyChart Message (if you have MyChart) OR A paper copy in the mail If you have any lab test that is abnormal or we need to change your treatment, we will call you to review the results.  Follow-Up: At [facility], you and your health needs are our priority.  As part of our continuing mission to provide you with exceptional heart care, we have created designated Provider Care Teams.  These Care Teams include your primary Cardiologist (physician) and Advanced Practice Providers (APPs -  Physician Assistants and Nurse Practitioners) who all work together to provide you with the care you need, when you need it.  Your next appointment:   5 month(s)  Provider:   Canary Brim, NP   Other Instructions: Call and let us know if your systolic (top number) on your blood pressure is consistently greater than 130

## 2023-12-30 ENCOUNTER — Other Ambulatory Visit: Payer: Self-pay | Admitting: Internal Medicine

## 2024-01-04 ENCOUNTER — Ambulatory Visit (INDEPENDENT_AMBULATORY_CARE_PROVIDER_SITE_OTHER): Admitting: Internal Medicine

## 2024-01-04 ENCOUNTER — Encounter: Payer: Self-pay | Admitting: Internal Medicine

## 2024-01-04 VITALS — BP 110/58 | HR 74 | Temp 98.2°F | Ht 72.0 in | Wt 306.2 lb

## 2024-01-04 DIAGNOSIS — Z1211 Encounter for screening for malignant neoplasm of colon: Secondary | ICD-10-CM

## 2024-01-04 DIAGNOSIS — E039 Hypothyroidism, unspecified: Secondary | ICD-10-CM

## 2024-01-04 DIAGNOSIS — Z125 Encounter for screening for malignant neoplasm of prostate: Secondary | ICD-10-CM

## 2024-01-04 DIAGNOSIS — E1169 Type 2 diabetes mellitus with other specified complication: Secondary | ICD-10-CM

## 2024-01-04 DIAGNOSIS — I10 Essential (primary) hypertension: Secondary | ICD-10-CM | POA: Diagnosis not present

## 2024-01-04 DIAGNOSIS — E119 Type 2 diabetes mellitus without complications: Secondary | ICD-10-CM | POA: Diagnosis not present

## 2024-01-04 DIAGNOSIS — G4733 Obstructive sleep apnea (adult) (pediatric): Secondary | ICD-10-CM

## 2024-01-04 DIAGNOSIS — E785 Hyperlipidemia, unspecified: Secondary | ICD-10-CM | POA: Diagnosis not present

## 2024-01-04 DIAGNOSIS — I2510 Atherosclerotic heart disease of native coronary artery without angina pectoris: Secondary | ICD-10-CM

## 2024-01-04 DIAGNOSIS — R202 Paresthesia of skin: Secondary | ICD-10-CM

## 2024-01-04 DIAGNOSIS — Z6841 Body Mass Index (BMI) 40.0 and over, adult: Secondary | ICD-10-CM

## 2024-01-04 DIAGNOSIS — G8929 Other chronic pain: Secondary | ICD-10-CM

## 2024-01-04 DIAGNOSIS — Z0000 Encounter for general adult medical examination without abnormal findings: Secondary | ICD-10-CM

## 2024-01-04 DIAGNOSIS — M5441 Lumbago with sciatica, right side: Secondary | ICD-10-CM

## 2024-01-04 DIAGNOSIS — I2583 Coronary atherosclerosis due to lipid rich plaque: Secondary | ICD-10-CM

## 2024-01-04 DIAGNOSIS — E669 Obesity, unspecified: Secondary | ICD-10-CM

## 2024-01-04 DIAGNOSIS — I480 Paroxysmal atrial fibrillation: Secondary | ICD-10-CM

## 2024-01-04 LAB — CBC WITH DIFFERENTIAL/PLATELET
Basophils Absolute: 0 10*3/uL (ref 0.0–0.1)
Basophils Relative: 1 % (ref 0.0–3.0)
Eosinophils Absolute: 0.1 10*3/uL (ref 0.0–0.7)
Eosinophils Relative: 3.2 % (ref 0.0–5.0)
HCT: 43.3 % (ref 39.0–52.0)
Hemoglobin: 14.7 g/dL (ref 13.0–17.0)
Lymphocytes Relative: 29.9 % (ref 12.0–46.0)
Lymphs Abs: 1.3 10*3/uL (ref 0.7–4.0)
MCHC: 33.9 g/dL (ref 30.0–36.0)
MCV: 91.8 fl (ref 78.0–100.0)
Monocytes Absolute: 0.5 10*3/uL (ref 0.1–1.0)
Monocytes Relative: 10.6 % (ref 3.0–12.0)
Neutro Abs: 2.4 10*3/uL (ref 1.4–7.7)
Neutrophils Relative %: 55.3 % (ref 43.0–77.0)
Platelets: 239 10*3/uL (ref 150.0–400.0)
RBC: 4.72 Mil/uL (ref 4.22–5.81)
RDW: 12.7 % (ref 11.5–15.5)
WBC: 4.4 10*3/uL (ref 4.0–10.5)

## 2024-01-04 LAB — COMPREHENSIVE METABOLIC PANEL WITH GFR
ALT: 29 U/L (ref 0–53)
AST: 20 U/L (ref 0–37)
Albumin: 4.2 g/dL (ref 3.5–5.2)
Alkaline Phosphatase: 49 U/L (ref 39–117)
BUN: 16 mg/dL (ref 6–23)
CO2: 30 meq/L (ref 19–32)
Calcium: 9.4 mg/dL (ref 8.4–10.5)
Chloride: 100 meq/L (ref 96–112)
Creatinine, Ser: 1.05 mg/dL (ref 0.40–1.50)
GFR: 75.34 mL/min (ref >60.00)
Glucose, Bld: 87 mg/dL (ref 70–99)
Potassium: 4.1 meq/L (ref 3.5–5.1)
Sodium: 136 meq/L (ref 135–145)
Total Bilirubin: 0.8 mg/dL (ref 0.2–1.2)
Total Protein: 6.6 g/dL (ref 6.0–8.3)

## 2024-01-04 LAB — HEMOGLOBIN A1C: Hgb A1c MFr Bld: 5.2 % (ref 4.6–6.5)

## 2024-01-04 LAB — TSH: TSH: 0.08 u[IU]/mL — ABNORMAL LOW (ref 0.35–5.50)

## 2024-01-04 LAB — PSA: PSA: 0.73 ng/mL (ref 0.10–4.00)

## 2024-01-04 LAB — T4, FREE: Free T4: 1.44 ng/dL (ref 0.60–1.60)

## 2024-01-04 MED ORDER — METHYLPREDNISOLONE 4 MG PO TBPK: ORAL_TABLET | ORAL | 0 refills | Status: DC

## 2024-01-04 NOTE — Assessment & Plan Note (Signed)
 Chronic On Levothroid -

## 2024-01-04 NOTE — Assessment & Plan Note (Addendum)
 L upper buttock Numbness (Patient noted numbness when laying down on back. "Pins and needles" after laying for 10-15 minutes  Position change/use props in bed Medrol  po if worse MRI LS spine if worse

## 2024-01-04 NOTE — Assessment & Plan Note (Signed)
-   On Rosuvastatin

## 2024-01-04 NOTE — Assessment & Plan Note (Signed)
 Doing better on the Rybelsus  Wt Readings from Last 3 Encounters:  01/04/24 (!) 306 lb 3.2 oz (138.9 kg)  12/06/23 (!) 302 lb 6.4 oz (137.2 kg)  10/26/23 (!) 304 lb 3.2 oz (138 kg)  On Rybelsus   Pt declined injectables

## 2024-01-04 NOTE — Assessment & Plan Note (Signed)
Continue with weight loss.  Continue with current therapy: Amlodipine, benazepril, Maxide

## 2024-01-04 NOTE — Progress Notes (Signed)
 Subjective:  Patient ID: Randy [name], male    DOB: [birth year]  Age: 63 y.o. MRN: [id number]  CC: Medical Management of Chronic Issues (Patient visits today to review recent medical events and concerns. Leg infection in the past that had resolved. December patient had ablation, and February had heart cath placement. Discus getting colonoscopy), Numbness (Patient noted numbness when laying down on back. "Pens and needles" after laying for 10-15 minutes ), and Shoulder Pain (Patient notes some right shoulder pain and lack of mobility. Currently treating with a Voltaren gel equivalent)   HPI [name] presents to review recent medical events and concerns. Leg infection in the past that had resolved. December patient had ablation, and February had heart cath placement. Discus getting colonoscopy), Numbness (Patient noted numbness when laying down on back. "Pins and needles" after laying for 10-15 minutes ), and Shoulder Pain (Patient notes some right shoulder pain and lack of mobility. Currently treating with a Voltaren gel equivalent)  Numbness (Patient noted numbness when laying down on back. "Pins and needles" after laying for 10-15 minutes ), and Shoulder Pain (Patient notes some right shoulder pain and lack of mobility.   Outpatient Medications Prior to Visit  Medication Sig Dispense Refill   acetaminophen  (TYLENOL ) 325 MG tablet Take 650 mg by mouth in the morning and at bedtime.     apixaban  (ELIQUIS ) 5 MG TABS tablet Take 1 tablet (5 mg total) by mouth 2 (two) times daily. 60 tablet 5   Ascorbic Acid  (VITAMIN C  WITH ROSE HIPS) 1000 MG tablet Take 1,000 mg by mouth daily in the afternoon.     benazepril  (LOTENSIN ) 20 MG tablet Take 1 tablet (20 mg total) by mouth daily. 90 tablet 3   Cholecalciferol  (VITAMIN D3) 50 MCG (2000 UT) TABS Take 2,000 Units by mouth every evening.     clopidogrel  (PLAVIX ) 75 MG tablet Take 1 tablet (75 mg total) by mouth daily with breakfast. 90 tablet 3    Coenzyme Q10 (COQ10) 100 MG CAPS Take 100 mg by mouth every morning.     Cyanocobalamin  (B-12) 2000 MCG TABS Take 2,000 mcg by mouth every morning.     levothyroxine  (SYNTHROID ) 125 MCG tablet Take 2 tablets (250 mcg total) by mouth daily before breakfast. TAKE 2 TABLETS BY MOUTH BEFORE BREAKFAST . 180 tablet 3   Multiple Vitamin (MULTIVITAMIN) tablet Take 1 tablet by mouth daily with breakfast.     nitroGLYCERIN  (NITROSTAT ) 0.4 MG SL tablet Place 1 tablet (0.4 mg total) under the tongue every 5 (five) minutes as needed for chest pain. 25 tablet 3   OVER THE COUNTER MEDICATION Take 2 capsules by mouth in the morning and at bedtime. Six Star Testosterone  Booster Has calcium , sodium, rodeola extract, gingko extract, and citrate     rosuvastatin  (CRESTOR ) 20 MG tablet Take 1 tablet (20 mg total) by mouth daily. 90 tablet 3   Semaglutide  (RYBELSUS ) 14 MG TABS Take 1 tablet by mouth once daily 30 tablet 5   triamterene -hydrochlorothiazide  (MAXZIDE -25) 37.5-25 MG tablet Take 1 tablet by mouth daily. 90 tablet 3   metoprolol  tartrate (LOPRESSOR ) 25 MG tablet Take 1 tablet (25 mg total) by mouth 2 (two) times daily. 180 tablet 3   No facility-administered medications prior to visit.    ROS: Review of Systems  Constitutional:  Negative for appetite change, fatigue and unexpected weight change.  HENT:  Negative for congestion, nosebleeds, sneezing, sore throat and trouble swallowing.   Eyes:  Negative for itching  and visual disturbance.  Respiratory:  Negative for cough.   Cardiovascular:  Positive for leg swelling. Negative for chest pain and palpitations.  Gastrointestinal:  Negative for abdominal distention, blood in stool, diarrhea and nausea.  Genitourinary:  Negative for frequency and hematuria.  Musculoskeletal:  Negative for back pain, gait problem, joint swelling and neck pain.  Skin:  Negative for rash.  Neurological:  Negative for dizziness, tremors, speech difficulty and weakness.   Psychiatric/Behavioral:  Negative for agitation, dysphoric mood, sleep disturbance and suicidal ideas. The patient is not nervous/anxious.     Objective:  BP (!) 110/58   Pulse 74   Temp 98.2 F (36.8 C)   Ht 6' (1.829 m)   Wt (!) 306 lb 3.2 oz (138.9 kg)   SpO2 97%   BMI 41.53 kg/m   BP Readings from Last 3 Encounters:  01/04/24 (!) 110/58  12/06/23 113/68  10/26/23 132/82    Wt Readings from Last 3 Encounters:  01/04/24 (!) 306 lb 3.2 oz (138.9 kg)  12/06/23 (!) 302 lb 6.4 oz (137.2 kg)  10/26/23 (!) 304 lb 3.2 oz (138 kg)    Physical Exam Constitutional:      General: He is not in acute distress.    Appearance: He is well-developed. He is obese.     Comments: NAD  Eyes:     Conjunctiva/sclera: Conjunctivae normal.     Pupils: Pupils are equal, round, and reactive to light.  Neck:     Thyroid : No thyromegaly.     Vascular: No JVD.  Cardiovascular:     Rate and Rhythm: Normal rate and regular rhythm.     Heart sounds: Normal heart sounds. No murmur heard.    No friction rub. No gallop.  Pulmonary:     Effort: Pulmonary effort is normal. No respiratory distress.     Breath sounds: Normal breath sounds. No wheezing or rales.  Chest:     Chest wall: No tenderness.  Abdominal:     General: Bowel sounds are normal. There is no distension.     Palpations: Abdomen is soft. There is no mass.     Tenderness: There is no abdominal tenderness. There is no guarding or rebound.  Musculoskeletal:        General: No tenderness. Normal range of motion.     Cervical back: Normal range of motion.     Right lower leg: No edema.     Left lower leg: No edema.  Lymphadenopathy:     Cervical: No cervical adenopathy.  Skin:    General: Skin is warm and dry.     Findings: No rash.  Neurological:     Mental Status: He is alert and oriented to person, place, and time.     Cranial Nerves: No cranial nerve deficit.     Motor: No abnormal muscle tone.     Coordination:  Coordination normal.     Gait: Gait normal.     Deep Tendon Reflexes: Reflexes are normal and symmetric.  Psychiatric:        Behavior: Behavior normal.        Thought Content: Thought content normal.        Judgment: Judgment normal.   Trace edema B Str leg elev (-) B L buttock palp - NT R shoulder - sensitive a little w/ROM  Lab Results  Component Value Date   WBC 4.9 10/17/2023   HGB 14.7 10/17/2023   HCT 42.9 10/17/2023   PLT 243 10/17/2023  GLUCOSE 86 10/17/2023   CHOL 110 02/08/2023   TRIG 67.0 02/08/2023   HDL 32.50 (L) 02/08/2023   LDLCALC 64 02/08/2023   ALT 43 05/23/2023   AST 25 05/23/2023   NA 138 10/17/2023   K 4.0 10/17/2023   CL 100 10/17/2023   CREATININE 1.06 10/17/2023   BUN 17 10/17/2023   CO2 23 10/17/2023   TSH 0.393 05/18/2023   PSA 1.09 02/08/2023   INR 1.2 05/17/2023   HGBA1C 5.3 02/08/2023   MICROALBUR 4.9 (H) 02/08/2023    CARDIAC CATHETERIZATION Result Date: 10/19/2023   Mid LAD lesion is 99% stenosed.   A drug-eluting stent was successfully placed using a SYNERGY XD 2.75X12.   Post intervention, there is a 0% residual stenosis.   LV end diastolic pressure is normal.   Recommend to resume Apixaban , at currently prescribed dose and frequency on 10/19/2023.   Recommend concurrent antiplatelet therapy of Aspirin  81 mg for 1 month and Clopidogrel  75mg  daily for 6 months . Single vessel occlusive CAD involving the mid LAD Normal LVEDP Successful PCI of the mid LAD with Shockwave lithotripsy and stenting with DES Plan: may resume Eliquis  this evening. ASA 81 mg for one month and Plavix  75 mg daily for 6 months. Anticipate same day DC    Assessment & Plan:   Problem List Items Addressed This Visit     Hypothyroidism   Chronic  On Levothroid      Dyslipidemia   On Rosuvastatin        Relevant Orders   TSH   OBESITY, MORBID   Doing better on the Rybelsus  Wt Readings from Last 3 Encounters:  01/04/24 (!) 306 lb 3.2 oz (138.9 kg)  12/06/23  (!) 302 lb 6.4 oz (137.2 kg)  10/26/23 (!) 304 lb 3.2 oz (138 kg)  On Rybelsus   Pt declined injectables       Essential hypertension   Continue with weight loss.  Continue with current therapy: Amlodipine , benazepril , Maxide      Well adult exam   Relevant Orders   CBC with Differential/Platelet   PSA   Type 2 diabetes mellitus (HCC)   On Rybelsus       Relevant Orders   Comprehensive metabolic panel with GFR   Hemoglobin A1c   PAF (paroxysmal atrial fibrillation) (HCC)   On Eliquis  Rate is controlled on Toprol , s/p ablation      OSA on CPAP - Primary   moderate OSA with an AHI of 17.5/h on auto CPAP      CAD (coronary artery disease)   S/p STENT      Paresthesias   L upper buttock Numbness (Patient noted numbness when laying down on back. "Pins and needles" after laying for 10-15 minutes  Position change/use props in bed Medrol  po if worse MRI LS spine if worse      Relevant Orders   T4, free   Other Visit Diagnoses       Screening for colon cancer       Relevant Orders   Cologuard         Meds ordered this encounter  Medications   methylPREDNISolone  (MEDROL  DOSEPAK) 4 MG TBPK tablet    Sig: As directed    Dispense:  21 tablet    Refill:  0      Follow-up: Return in about 3 months (around 04/04/2024) for a follow-up visit.  Anitra Barn, MD

## 2024-01-04 NOTE — Assessment & Plan Note (Signed)
 On Eliquis  Rate is controlled on Toprol , s/p ablation

## 2024-01-04 NOTE — Assessment & Plan Note (Signed)
 moderate OSA with an AHI of 17.5/h on auto CPAP

## 2024-01-04 NOTE — Assessment & Plan Note (Signed)
 S/p STENT

## 2024-01-04 NOTE — Assessment & Plan Note (Signed)
On Rybelsus 

## 2024-01-07 ENCOUNTER — Encounter: Payer: Self-pay | Admitting: Internal Medicine

## 2024-01-16 ENCOUNTER — Other Ambulatory Visit (HOSPITAL_COMMUNITY): Payer: Self-pay

## 2024-01-19 ENCOUNTER — Other Ambulatory Visit (HOSPITAL_COMMUNITY): Payer: Self-pay

## 2024-01-21 ENCOUNTER — Other Ambulatory Visit: Payer: Self-pay | Admitting: Internal Medicine

## 2024-01-23 NOTE — Progress Notes (Unsigned)
 Cardiology Office Note    Patient Name: Randy [name] Date of Encounter: 01/23/2024  Primary Care Provider:  Genia Kettering, MD Primary Cardiologist:  Ola Berger, MD Primary Electrophysiologist: Will Cortland Ding, MD   Past Medical History    Past Medical History:  Diagnosis Date   At risk for sleep apnea    STOP-BANG= 6         SENT TO PCP 06-24-2016   Back pain    History of thyroid  nodule    s/p  right thyroid  lopectomy--  per path report:  follicular adenoma, chronic thyroiditis   Hyperlipidemia    Hypertension    Hypogonadism male    Hypothyroidism    Obesity    OSA on CPAP    moderate OSA with an AHI of 17.5/h on auto CPAP   Other specified disorders of tendon, right knee    quad tendon tear   PAF (paroxysmal atrial fibrillation) (HCC)    SOB (shortness of breath)    Vitamin D  deficiency    Wears glasses     History of Present Illness  Randy [name] is a 63 y.o. male with a PMH CAD s/p 99% stenosed mid LAD treated with DES x 1 paroxysmal AF, DM type II, hypothyroidism s/p partial thyroidectomy, morbid obesity, OSA (on CPAP) who presents today for 65-month follow-up.  Randy [name] was last seen on 10/26/2023 following PCI performed on 10/19/2023 with 99% stenosed mid LAD lesion treated with shockwave lithotripsy and DES x 1.  He works full-time for [employer] and was unable to accommodate his schedule for cardiac rehab.   Patient denies chest pain, palpitations, dyspnea, PND, orthopnea, nausea, vomiting, dizziness, syncope, edema, weight gain, or early satiety.   Discussed the use of AI scribe software for clinical note transcription with the patient, who gave verbal consent to proceed.  History of Present Illness    ***Notes:   Review of Systems  Please see the history of present illness.    All other systems reviewed and are otherwise negative except as noted above.  Physical Exam     Wt Readings from Last 3 Encounters:  01/04/24 (!)  306 lb 3.2 oz (138.9 kg)  12/06/23 (!) 302 lb 6.4 oz (137.2 kg)  10/26/23 (!) 304 lb 3.2 oz (138 kg)   WU:JWJXB were no vitals filed for this visit.,There is no height or weight on file to calculate BMI. GEN: Well nourished, well developed in no acute distress Neck: No JVD; No carotid bruits Pulmonary: Clear to auscultation without rales, wheezing or rhonchi  Cardiovascular: Normal rate. Regular rhythm. Normal S1. Normal S2.   Murmurs: There is no murmur.  ABDOMEN: Soft, non-tender, non-distended EXTREMITIES:  No edema; No deformity   EKG/LABS/ Recent Cardiac Studies   ECG personally reviewed by me today - ***  Risk Assessment/Calculations:   {Does this patient have ATRIAL FIBRILLATION?:[phone]}  STOP-Bang Score:  7  { Consider Dx Sleep Disordered Breathing or Sleep Apnea  ICD G47.33          :1}    Lab Results  Component Value Date   WBC 4.4 01/04/2024   HGB 14.7 01/04/2024   HCT 43.3 01/04/2024   MCV 91.8 01/04/2024   PLT 239.0 01/04/2024   Lab Results  Component Value Date   CREATININE 1.05 01/04/2024   BUN 16 01/04/2024   NA 136 01/04/2024   K 4.1 01/04/2024   CL 100 01/04/2024   CO2 30 01/04/2024   Lab Results  Component Value Date   CHOL 110 02/08/2023   HDL 32.50 (L) 02/08/2023   LDLCALC 64 02/08/2023   TRIG 67.0 02/08/2023   CHOLHDL 3 02/08/2023    Lab Results  Component Value Date   HGBA1C 5.2 01/04/2024   Assessment & Plan    Assessment and Plan Assessment & Plan     1.  Coronary artery disease: -s/p LHC with mid LAD 99% stenosis treated with shockwave protriptyline and DES x 1 with triple therapy of ASA 81 mg, Plavix  75 mg and Eliquis  with plan to discontinue ASA on 11/16/2023 -Today patient reports no chest pain or shortness of breath since heart catheterization procedure. -He is currently working full-time as a Runner, broadcasting/film/video and is unable to accommodate his scheduled for cardiac rehab at this time. -Continue current GDMT with ASA 81 mg, Plavix  75  mg, metoprolol  25 mg twice daily, Nitrostat  0.4 mg as needed, Crestor  20 mg daily   2.  Paroxysmal AF: -Patient currently on metoprolol  25 mg twice daily and Cardizem  60 mg daily for rate control -Most recent hemoglobin was 13.6 and creatinine was 1.06 -Continue Eliquis  5 mg twice daily   3.  Primary hypertension: -Blood pressure today was 132/82 -Continue metoprolol  25 mg twice daily, Cardizem  60 mg every 12 hours, Maxide 37.5-25 daily   4.  Hyperlipidemia: -Patient's last LDL cholesterol was 52 -Continue Crestor  20 mg daily -We will check LFTs and lipids in 8 weeks   5.  Obstructive sleep apnea: -Patient reports ongoing compliance at this time      Disposition: Follow-up with Ola Berger, MD or APP in *** months {Are you ordering a CV Procedure (e.g. stress test, cath, DCCV, TEE, etc)?   Press F2        :[id number]}   Signed, Francene Ing, Retha Cast, NP 01/23/2024, 7:30 PM [facility]

## 2024-01-24 ENCOUNTER — Ambulatory Visit: Payer: 59 | Attending: Nurse Practitioner | Admitting: Nurse Practitioner

## 2024-01-24 ENCOUNTER — Encounter: Payer: Self-pay | Admitting: Nurse Practitioner

## 2024-01-24 VITALS — BP 120/70 | HR 82 | Ht 72.0 in | Wt 309.0 lb

## 2024-01-24 DIAGNOSIS — E785 Hyperlipidemia, unspecified: Secondary | ICD-10-CM

## 2024-01-24 DIAGNOSIS — I4819 Other persistent atrial fibrillation: Secondary | ICD-10-CM

## 2024-01-24 DIAGNOSIS — I2510 Atherosclerotic heart disease of native coronary artery without angina pectoris: Secondary | ICD-10-CM

## 2024-01-24 DIAGNOSIS — G4733 Obstructive sleep apnea (adult) (pediatric): Secondary | ICD-10-CM

## 2024-01-24 DIAGNOSIS — I10 Essential (primary) hypertension: Secondary | ICD-10-CM | POA: Diagnosis not present

## 2024-01-24 NOTE — Patient Instructions (Addendum)
 Medication Instructions:  Your physician recommends that you continue on your current medications as directed. Please refer to the Current Medication list given to you today. *If you need a refill on your cardiac medications before your next appointment, please call your pharmacy*  Lab Work: None ordered If you have labs (blood work) drawn today and your tests are completely normal, you will receive your results only by: MyChart Message (if you have MyChart) OR A paper copy in the mail If you have any lab test that is abnormal or we need to change your treatment, we will call you to review the results.  Testing/Procedures: None ordered  Follow-Up: At [facility], you and your health needs are our priority.  As part of our continuing mission to provide you with exceptional heart care, our providers are all part of one team.  This team includes your primary Cardiologist (physician) and Advanced Practice Providers or APPs (Physician Assistants and Nurse Practitioners) who all work together to provide you with the care you need, when you need it.  Your next appointment:   6 month(s)  Provider:   Ola Berger, MD    We recommend signing up for the patient portal called "MyChart".  Sign up information is provided on this After Visit Summary.  MyChart is used to connect with patients for Virtual Visits (Telemedicine).  Patients are able to view lab/test results, encounter notes, upcoming appointments, etc.  Non-urgent messages can be sent to your provider as well.   To learn more about what you can do with MyChart, go to [email or web address].   Other Instructions

## 2024-02-09 LAB — COLOGUARD

## 2024-02-11 ENCOUNTER — Ambulatory Visit: Payer: Self-pay | Admitting: Internal Medicine

## 2024-02-28 ENCOUNTER — Other Ambulatory Visit: Payer: Self-pay | Admitting: Internal Medicine

## 2024-03-13 LAB — COLOGUARD: COLOGUARD: POSITIVE — AB

## 2024-04-07 ENCOUNTER — Other Ambulatory Visit: Payer: Self-pay | Admitting: Internal Medicine

## 2024-04-07 DIAGNOSIS — R195 Other fecal abnormalities: Secondary | ICD-10-CM

## 2024-04-24 NOTE — Progress Notes (Unsigned)
 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   Since last being seen in our clinic the patient reports doing very well. He has not had any evidence of AF. He asks about coming off Plavix  post stent. He notes he will bump his legs or his dog will scratch him and he will bleed and it takes a while to stop it.  No other bleeding issues. BP has been well controlled off diltiazem  (stopped post ablation).  He denies chest pain, palpitations, dyspnea, PND, orthopnea, nausea, vomiting, dizziness, syncope, edema, weight gain, or early satiety.   Review of systems complete and found to be negative unless listed in HPI.   EP Information / Studies Reviewed:    EKG is not ordered today. EKG from 12/06/23 reviewed which showed NSR 77 bpm       Studies:  ECHO 05/2023 >  LVEF 50-55%, LA severely dilated, RA mod dilated CT Cardiac Morphology 07/2023 > normal PV drainage into LA, no PFO/ASD, normal coronary origin, right dominance. CAC 2312 which is 98th percentile for matched controls  EPS 08/2023 > SR on presentation, successful electrical isolation and anatomical encircling of all 4 pulmonary veins with PFA, posterior wall isolation using PFA, CTI ablation  LHC 10/19/23 > single vessel CAD involving the mid LAD, normal LVEDP, successful PCI of the mid LAD with shockwave lithotripsy & stenting with DES  Arrhythmia / AAD AF  EPS 08/2023 > SR on presentation, successful electrical isolation and anatomical encircling of all 4 pulmonary veins with PFA, posterior wall isolation using PFA, CTI ablation    Risk  Assessment/Calculations:    CHA2DS2-VASc Score = 3   This indicates a 3.2% annual risk of stroke. The patient's score is based upon: CHF History: 0 HTN History: 1 Diabetes History: 1 Stroke History: 0 Vascular Disease History: 1 Age Score: 0 Gender Score: 0        STOP-Bang Score:  7       Physical Exam:   VS:  BP 120/76 (BP Location: Left Arm, Patient Position: Sitting, Cuff Size: Large)   Pulse 72   Ht 6' (1.829 m)   Wt (!) 310 lb (140.6 kg)   SpO2 97%   BMI 42.04 kg/m    Wt Readings from Last 3 Encounters:  04/30/24 (!) 310 lb (140.6 kg)  04/25/24 (!) 310 lb (140.6 kg)  01/24/24 (!) 309 lb (140.2 kg)     GEN: Well nourished, well developed in no acute distress NECK: No JVD; No carotid bruits CARDIAC: Regular rate and rhythm (SR by auscultation), no murmurs, rubs, gallops RESPIRATORY:  Clear to auscultation without rales, wheezing or rhonchi  ABDOMEN: Soft, non-tender, non-distended EXTREMITIES:  No edema; No deformity   ASSESSMENT AND PLAN:    Persistent Atrial Fibrillation  Atrial Flutter  CHA2DS2-VASc 3, s/p PVI/CTI ablation 09/07/23  -OAC for stroke prophylaxis  -continue lopressor  25 mg BID    -BP has been well controlled off diltiazem    -no AF burden   -pt encouraged to get a wearable device such as a smart watch or kardia mobile   Secondary Hypercoagulable State  -continue  Eliquis  5mg  BID, dose reviewed and appropriate by age / wt   Hypertension  -well controlled on current regimen   CAD s/p PCI and DES  Elevated CCS on CT at 2312 -follows with Dr. Okey  -LHC 10/19/23 with single vessel CAD s/p PCI of the mid LAD and DES, rec's for ASA 81 mg x1 month, plavix  75mg  x6 months  > no significant bleeding issues.  Reviewed with Dr. Okey in clinic and ok to stop Plavix .    OSA  -CPAP compliant > uses nasal pillows   Follow up with Dr. Inocencio or EP APP in 9 months .  Sooner if new symptoms arise.   Signed, Daphne Barrack, NP-C, AGACNP-BC [place]  [facility] - Electrophysiology  04/30/2024, 4:11 PM

## 2024-04-25 ENCOUNTER — Ambulatory Visit: Admitting: Internal Medicine

## 2024-04-25 ENCOUNTER — Other Ambulatory Visit: Payer: Self-pay | Admitting: Internal Medicine

## 2024-04-25 VITALS — BP 112/60 | HR 65 | Temp 98.7°F | Ht 72.0 in | Wt 310.0 lb

## 2024-04-25 DIAGNOSIS — I10 Essential (primary) hypertension: Secondary | ICD-10-CM

## 2024-04-25 DIAGNOSIS — F4321 Adjustment disorder with depressed mood: Secondary | ICD-10-CM

## 2024-04-25 DIAGNOSIS — E119 Type 2 diabetes mellitus without complications: Secondary | ICD-10-CM

## 2024-04-25 DIAGNOSIS — G8929 Other chronic pain: Secondary | ICD-10-CM | POA: Diagnosis not present

## 2024-04-25 DIAGNOSIS — I2510 Atherosclerotic heart disease of native coronary artery without angina pectoris: Secondary | ICD-10-CM | POA: Diagnosis not present

## 2024-04-25 DIAGNOSIS — M5441 Lumbago with sciatica, right side: Secondary | ICD-10-CM

## 2024-04-25 DIAGNOSIS — I2583 Coronary atherosclerosis due to lipid rich plaque: Secondary | ICD-10-CM

## 2024-04-25 DIAGNOSIS — Z7985 Long-term (current) use of injectable non-insulin antidiabetic drugs: Secondary | ICD-10-CM

## 2024-04-25 MED ORDER — SEMAGLUTIDE (1 MG/DOSE) 4 MG/3ML ~~LOC~~ SOPN: 1.0000 mg | PEN_INJECTOR | SUBCUTANEOUS | 5 refills | Status: DC

## 2024-04-25 NOTE — Progress Notes (Signed)
 Subjective:  Patient ID: Randy [name], male    DOB: 1961-07-08  Age: 62 y.o. MRN: [id number]  CC: Medical Management of Chronic Issues (3 mnth f/u )   HPI Randy [name] presents for HTN, DM, obesity  Outpatient Medications Prior to Visit  Medication Sig Dispense Refill   acetaminophen  (TYLENOL ) 325 MG tablet Take 650 mg by mouth in the morning and at bedtime.     apixaban  (ELIQUIS ) 5 MG TABS tablet Take 1 tablet by mouth twice daily 60 tablet 5   Ascorbic Acid  (VITAMIN C  WITH ROSE HIPS) 1000 MG tablet Take 1,000 mg by mouth daily in the afternoon.     benazepril  (LOTENSIN ) 20 MG tablet Take 1 tablet by mouth once daily 90 tablet 3   Cholecalciferol  (VITAMIN D3) 50 MCG (2000 UT) TABS Take 2,000 Units by mouth every evening.     Coenzyme Q10 (COQ10) 100 MG CAPS Take 100 mg by mouth every morning.     Cyanocobalamin  (B-12) 2000 MCG TABS Take 2,000 mcg by mouth every morning. (Patient not taking: Reported on 04/30/2024)     levothyroxine  (SYNTHROID ) 125 MCG tablet TAKE 2 TABLETS BY MOUTH BEFORE BREAKFAST DAILY 180 tablet 3   metoprolol  tartrate (LOPRESSOR ) 25 MG tablet Take 1 tablet (25 mg total) by mouth 2 (two) times daily. 180 tablet 3   Multiple Vitamin (MULTIVITAMIN) tablet Take 1 tablet by mouth daily with breakfast.     nitroGLYCERIN  (NITROSTAT ) 0.4 MG SL tablet Place 1 tablet (0.4 mg total) under the tongue every 5 (five) minutes as needed for chest pain. 25 tablet 3   OVER THE COUNTER MEDICATION Take 2 capsules by mouth in the morning and at bedtime. Six Star Testosterone  Booster Has calcium , sodium, rodeola extract, gingko extract, and citrate     rosuvastatin  (CRESTOR ) 20 MG tablet Take 1 tablet (20 mg total) by mouth daily. 90 tablet 3   triamterene -hydrochlorothiazide  (MAXZIDE -25) 37.5-25 MG tablet Take 1 tablet by mouth once daily 90 tablet 3   clopidogrel  (PLAVIX ) 75 MG tablet Take 1 tablet (75 mg total) by mouth daily with breakfast. 90 tablet 3   Semaglutide  (RYBELSUS ) 14 MG  TABS Take 1 tablet by mouth once daily 30 tablet 5   No facility-administered medications prior to visit.    ROS: Review of Systems  Constitutional:  Negative for appetite change, fatigue and unexpected weight change.  HENT:  Negative for congestion, nosebleeds, sneezing, sore throat and trouble swallowing.   Eyes:  Negative for itching and visual disturbance.  Respiratory:  Negative for cough.   Cardiovascular:  Negative for chest pain, palpitations and leg swelling.  Gastrointestinal:  Negative for abdominal distention, blood in stool, diarrhea and nausea.  Genitourinary:  Negative for frequency and hematuria.  Musculoskeletal:  Negative for back pain, gait problem, joint swelling and neck pain.  Skin:  Negative for rash.  Neurological:  Negative for dizziness, tremors, speech difficulty and weakness.  Psychiatric/Behavioral:  Negative for agitation, dysphoric mood and sleep disturbance. The patient is not nervous/anxious.     Objective:  BP 112/60   Pulse 65   Temp 98.7 F (37.1 C) (Oral)   Ht 6' (1.829 m)   Wt (!) 310 lb (140.6 kg)   SpO2 97%   BMI 42.04 kg/m   BP Readings from Last 3 Encounters:  04/30/24 120/76  04/25/24 112/60  01/24/24 120/70    Wt Readings from Last 3 Encounters:  04/30/24 (!) 310 lb (140.6 kg)  04/25/24 (!) 310 lb (140.6  kg)  01/24/24 (!) 309 lb (140.2 kg)    Physical Exam Constitutional:      General: He is not in acute distress.    Appearance: Normal appearance. He is well-developed. He is obese.     Comments: NAD  Eyes:     Conjunctiva/sclera: Conjunctivae normal.     Pupils: Pupils are equal, round, and reactive to light.  Neck:     Thyroid : No thyromegaly.     Vascular: No JVD.  Cardiovascular:     Rate and Rhythm: Normal rate and regular rhythm.     Heart sounds: Normal heart sounds. No murmur heard.    No friction rub. No gallop.  Pulmonary:     Effort: Pulmonary effort is normal. No respiratory distress.     Breath sounds:  Normal breath sounds. No wheezing or rales.  Chest:     Chest wall: No tenderness.  Abdominal:     General: Bowel sounds are normal. There is no distension.     Palpations: Abdomen is soft. There is no mass.     Tenderness: There is no abdominal tenderness. There is no guarding or rebound.  Musculoskeletal:        General: No tenderness. Normal range of motion.     Cervical back: Normal range of motion.  Lymphadenopathy:     Cervical: No cervical adenopathy.  Skin:    General: Skin is warm and dry.     Findings: No rash.  Neurological:     Mental Status: He is alert and oriented to person, place, and time.     Cranial Nerves: No cranial nerve deficit.     Motor: No abnormal muscle tone.     Coordination: Coordination normal.     Gait: Gait normal.     Deep Tendon Reflexes: Reflexes are normal and symmetric.  Psychiatric:        Behavior: Behavior normal.        Thought Content: Thought content normal.        Judgment: Judgment normal.     Lab Results  Component Value Date   WBC 4.4 01/04/2024   HGB 14.7 01/04/2024   HCT 43.3 01/04/2024   PLT 239.0 01/04/2024   GLUCOSE 87 01/04/2024   CHOL 110 02/08/2023   TRIG 67.0 02/08/2023   HDL 32.50 (L) 02/08/2023   LDLCALC 64 02/08/2023   ALT 29 01/04/2024   AST 20 01/04/2024   NA 136 01/04/2024   K 4.1 01/04/2024   CL 100 01/04/2024   CREATININE 1.05 01/04/2024   BUN 16 01/04/2024   CO2 30 01/04/2024   TSH 0.08 (L) 01/04/2024   PSA 0.73 01/04/2024   INR 1.2 05/17/2023   HGBA1C 5.2 01/04/2024    CARDIAC CATHETERIZATION Result Date: 10/19/2023   Mid LAD lesion is 99% stenosed.   A drug-eluting stent was successfully placed using a SYNERGY XD 2.75X12.   Post intervention, there is a 0% residual stenosis.   LV end diastolic pressure is normal.   Recommend to resume Apixaban , at currently prescribed dose and frequency on 10/19/2023.   Recommend concurrent antiplatelet therapy of Aspirin  81 mg for 1 month and Clopidogrel  75mg   daily for 6 months . Single vessel occlusive CAD involving the mid LAD Normal LVEDP Successful PCI of the mid LAD with Shockwave lithotripsy and stenting with DES Plan: may resume Eliquis  this evening. ASA 81 mg for one month and Plavix  75 mg daily for 6 months. Anticipate same day DC    Assessment &  Plan:   Problem List Items Addressed This Visit     CAD (coronary artery disease)   Probably ok to d/c Plavix  now - check w/Cardiology      Essential hypertension   Continue with weight loss.  Continue with current therapy: Amlodipine , benazepril , Maxide      Low back pain   No LBP      OBESITY, MORBID   Doing better on the Rybelsus  Wt Readings from Last 3 Encounters:  04/25/24 (!) 310 lb (140.6 kg)  01/24/24 (!) 309 lb (140.2 kg)  01/04/24 (!) 306 lb 3.2 oz (138.9 kg)  On Rybelsus   Pt declined injectables       Type 2 diabetes mellitus (HCC) - Primary   Ozempic  is suggested instead - start w/1 mg per week          Meds ordered this encounter  Medications   DISCONTD: Semaglutide , 1 MG/DOSE, 4 MG/3ML SOPN    Sig: Inject 1 mg as directed once a week.    Dispense:  3 mL    Refill:  5      Follow-up: Return in about 3 months (around 07/26/2024) for a follow-up visit.  Marolyn Noel, MD

## 2024-04-25 NOTE — Assessment & Plan Note (Signed)
 Ozempic  is suggested instead - start w/1 mg per week

## 2024-04-25 NOTE — Assessment & Plan Note (Signed)
 Doing better on the Rybelsus  Wt Readings from Last 3 Encounters:  04/25/24 (!) 310 lb (140.6 kg)  01/24/24 (!) 309 lb (140.2 kg)  01/04/24 (!) 306 lb 3.2 oz (138.9 kg)  On Rybelsus   Pt declined injectables

## 2024-04-25 NOTE — Assessment & Plan Note (Signed)
No LBP 

## 2024-04-25 NOTE — Assessment & Plan Note (Deleted)
 Mother died on 05/14/19 Coping ok

## 2024-04-25 NOTE — Assessment & Plan Note (Signed)
 Probably ok to d/c Plavix  now - check w/Cardiology

## 2024-04-30 ENCOUNTER — Encounter: Payer: Self-pay | Admitting: Pulmonary Disease

## 2024-04-30 ENCOUNTER — Ambulatory Visit: Attending: Pulmonary Disease | Admitting: Pulmonary Disease

## 2024-04-30 ENCOUNTER — Telehealth: Payer: Self-pay

## 2024-04-30 ENCOUNTER — Other Ambulatory Visit (HOSPITAL_COMMUNITY): Payer: Self-pay

## 2024-04-30 VITALS — BP 120/76 | HR 72 | Ht 72.0 in | Wt 310.0 lb

## 2024-04-30 DIAGNOSIS — I10 Essential (primary) hypertension: Secondary | ICD-10-CM | POA: Diagnosis not present

## 2024-04-30 DIAGNOSIS — I2510 Atherosclerotic heart disease of native coronary artery without angina pectoris: Secondary | ICD-10-CM

## 2024-04-30 DIAGNOSIS — D6869 Other thrombophilia: Secondary | ICD-10-CM | POA: Diagnosis not present

## 2024-04-30 DIAGNOSIS — Z955 Presence of coronary angioplasty implant and graft: Secondary | ICD-10-CM

## 2024-04-30 DIAGNOSIS — I4819 Other persistent atrial fibrillation: Secondary | ICD-10-CM

## 2024-04-30 DIAGNOSIS — G4733 Obstructive sleep apnea (adult) (pediatric): Secondary | ICD-10-CM

## 2024-04-30 NOTE — Patient Instructions (Addendum)
 Medication Instructions:  Stop plavix  *If you need a refill on your cardiac medications before your next appointment, please call your pharmacy*  Lab Work: None ordered If you have labs (blood work) drawn today and your tests are completely normal, you will receive your results only by: MyChart Message (if you have MyChart) OR A paper copy in the mail If you have any lab test that is abnormal or we need to change your treatment, we will call you to review the results.  Follow-Up: At [facility], you and your health needs are our priority.  As part of our continuing mission to provide you with exceptional heart care, our providers are all part of one team.  This team includes your primary Cardiologist (physician) and Advanced Practice Providers or APPs (Physician Assistants and Nurse Practitioners) who all work together to provide you with the care you need, when you need it.  Your next appointment:   9 month(s)  Provider:   You may see Will Gladis Norton, MD or one of the following Advanced Practice Providers on your designated Care Team:   Charlies Arthur, [place] Ozell Jodie Passey, PA-C Suzann Riddle, NP Daphne Barrack, NP   Consider a smartwatck or KardiaMobile  AliveCor  FDA-cleared EKG at your fingertips. - [employer]   KardiaMobile - [employer]iveCor, [employer]. [email or web address]://store.[email or web address].[email or web address]   FDA-cleared, clinical grade mobile EKG monitor: Crist is the most clinically-validated mobile EKG used by the world's leading cardiac care medical professionals.  This may be useful in monitoring palpitations.  We do not have access to have them emailed and reviewed but will be glad to review while in the office.

## 2024-04-30 NOTE — Telephone Encounter (Signed)
 Prior Authorization form/request asks a question that requires your assistance. Please see the question below and advise accordingly. The PA will not be submitted until the necessary information is received.  The highest A1C is 5.9 and highest glucose is 112

## 2024-05-03 ENCOUNTER — Other Ambulatory Visit (HOSPITAL_COMMUNITY): Payer: Self-pay

## 2024-05-05 ENCOUNTER — Encounter: Payer: Self-pay | Admitting: Internal Medicine

## 2024-05-05 NOTE — Assessment & Plan Note (Signed)
Continue with weight loss.  Continue with current therapy: Amlodipine, benazepril, Maxide

## 2024-05-18 ENCOUNTER — Encounter: Payer: Self-pay | Admitting: Internal Medicine

## 2024-05-24 ENCOUNTER — Encounter: Payer: Self-pay | Admitting: Physician Assistant

## 2024-06-07 ENCOUNTER — Telehealth: Payer: Self-pay | Admitting: Radiology

## 2024-06-07 NOTE — Telephone Encounter (Signed)
 Copied from CRM #[id number]. Topic: Clinical - Prescription Issue >> Jun 07, 2024 11:39 AM Thersia BROCKS wrote: Reason for CRM: Riy from [facility] pharmacy called in regarding  OZEMPIC , 1 MG/DOSE, 4 MG/3ML SOPN suppose to switch to 0.5 first so wanted to confirm   [phone]

## 2024-06-11 NOTE — Telephone Encounter (Addendum)
 [facility] pharmacy is calling to speak to an RN to verify the dosing of a medication for the patient. They are stating the pharmacist advised the starting dose is usually 0.5MG  and they received an RX for 1MG . They ar just needing to verify if this is correct or not.   [facility] pharmacy can be reached [phone]

## 2024-06-11 NOTE — Telephone Encounter (Signed)
 Please confirm with provider, thank you

## 2024-06-17 NOTE — Telephone Encounter (Signed)
 Yes, but he is switching from Rybelsus .  Thanks

## 2024-06-21 ENCOUNTER — Telehealth: Payer: Self-pay

## 2024-06-21 ENCOUNTER — Other Ambulatory Visit (HOSPITAL_COMMUNITY): Payer: Self-pay

## 2024-06-21 NOTE — Telephone Encounter (Signed)
 Pharmacy Patient Advocate Encounter   Received notification from Onbase that prior authorization for Ozempic  (1 MG/DOSE) 4MG /3ML pen-injectors  is required/requested.   Insurance verification completed.   The patient is insured through [facility].   Per test claim:  PA required; PA started via CoverMyMeds. KEY [id number].   Insurance requires supporting laboratory documentation to be submitted with the PA. Specifically, this includes either an A1c greater than 6.4% or two fasting glucose levels greater than 126 mg/dL.

## 2024-06-22 ENCOUNTER — Other Ambulatory Visit: Payer: Self-pay | Admitting: Internal Medicine

## 2024-06-24 NOTE — Telephone Encounter (Signed)
 He had glucose of 129 and 133 fasting Thank you

## 2024-06-30 ENCOUNTER — Encounter: Payer: Self-pay | Admitting: Internal Medicine

## 2024-06-30 ENCOUNTER — Other Ambulatory Visit: Payer: Self-pay | Admitting: Internal Medicine

## 2024-07-01 ENCOUNTER — Other Ambulatory Visit: Payer: Self-pay

## 2024-07-01 DIAGNOSIS — E119 Type 2 diabetes mellitus without complications: Secondary | ICD-10-CM

## 2024-07-01 MED ORDER — RYBELSUS 14 MG PO TABS: 1.0000 | ORAL_TABLET | Freq: Every day | ORAL | 3 refills | Status: DC

## 2024-07-01 NOTE — Telephone Encounter (Signed)
 Per pt PCP He had glucose of 129 and 133 fasting Thank you

## 2024-07-07 ENCOUNTER — Other Ambulatory Visit: Payer: Self-pay | Admitting: Internal Medicine

## 2024-07-10 ENCOUNTER — Telehealth: Payer: Self-pay

## 2024-07-10 NOTE — Telephone Encounter (Signed)
 Copied from CRM [id number]. Topic: Clinical - Medication Question >> Jul 09, 2024  2:51 PM Lauren C wrote: Reason for CRM: [facility] pharmacy calling to clarify medications Plavix  and Eliquis . Pt just picked up 90 day supply of Plavix , but they show he is also taking eliquis . Does he need both medications? Please return call to [phone]

## 2024-07-11 NOTE — Telephone Encounter (Signed)
 Jackquline was supposed to stop Plavix  in August and confirm it with his cardiologist.  Usually we continue Plavix  for 12 months after stent placement.  Sometimes Plavix  is used for a shorter duration course after stent.  Continue with Eliquis .  Thanks

## 2024-07-11 NOTE — Telephone Encounter (Signed)
 Provider has stated Randy [name] was supposed to stop Plavix  in August and confirm it with his cardiologist.  Usually we continue Plavix  for 12 months after stent placement.  Sometimes Plavix  is used for a shorter duration course after stent.   Continue with Eliquis .  Thanks

## 2024-07-16 ENCOUNTER — Other Ambulatory Visit (HOSPITAL_COMMUNITY): Payer: Self-pay

## 2024-07-16 ENCOUNTER — Telehealth: Payer: Self-pay

## 2024-07-16 NOTE — Telephone Encounter (Signed)
 Pharmacy Patient Advocate Encounter  Received notification from [facility] that Prior Authorization for Ozempic  4mg /21ml has been APPROVED from 07/16/24 to 07/17/27   PA #/Case ID/Reference #: [id number]

## 2024-07-16 NOTE — Telephone Encounter (Signed)
 Pharmacy Patient Advocate Encounter   Received notification from Pt Calls Messages that prior authorization for Ozempic  2mg /17ml is required/requested.   Insurance verification completed.   The patient is insured through [facility].   Per test claim: PA required; PA submitted to above mentioned insurance via Latent Key/confirmation #/EOC [facility] Status is pending

## 2024-07-17 ENCOUNTER — Ambulatory Visit: Admitting: Physician Assistant

## 2024-07-17 ENCOUNTER — Telehealth: Payer: Self-pay

## 2024-07-17 ENCOUNTER — Encounter: Payer: Self-pay | Admitting: Physician Assistant

## 2024-07-17 VITALS — BP 180/90 | HR 80 | Ht 72.0 in | Wt 307.0 lb

## 2024-07-17 DIAGNOSIS — R195 Other fecal abnormalities: Secondary | ICD-10-CM | POA: Diagnosis not present

## 2024-07-17 MED ORDER — NA SULFATE-K SULFATE-MG SULF 17.5-3.13-1.6 GM/177ML PO SOLN: 1.0000 | Freq: Once | ORAL | 0 refills | Status: AC

## 2024-07-17 NOTE — Progress Notes (Unsigned)
 Ellouise Console, PA-C [address] [place], [place]  [address] Phone: [phone]   Gastroenterology Consultation  Referring Provider:     Garald Karlynn GAILS, MD Primary Care Physician:  Garald Karlynn GAILS, MD Primary Gastroenterologist:  Ellouise Console, PA-C / Lupita Commander, MD  Reason for Consultation:     Discuss colonoscopy; positive screening Cologuard; history of adenomatous colon polyp        HPI:   Discussed the use of AI scribe software for clinical note transcription with the patient, who gave verbal consent to proceed.  03/08/2024: Positive screening Cologuard.  01/2003 last colonoscopy by Dr. Commander: 1 small 7 mm tubular adenoma polyp removed from hepatic flexure.  Anal irritation.  History of Present Illness Randy [name] is a 63 year old male who presents for evaluation following a positive Cologuard test.  He had a tubular adenoma polyp removed in 2004, measuring 7 millimeters. A recent positive Cologuard test in June 2025 prompted this visit. He previously had a negative Cologuard result, which delayed his return to a gastroenterologist. No abdominal pain, diarrhea, constipation, or blood in stool.  He has experienced significant weight loss over the past year and a half, attributed to Rybelsus , with a total loss of 75 pounds. He was previously approaching 400 pounds and is now around 300 pounds, aiming for 250 pounds. He mentions a plateau in weight loss over the past 5-6 months.  He is currently on Rybelsus  and is also taking Eliquis  and metoprolol  following a heart catheterization in February 2025 for blockage clearance and an AFib procedure last year.  He reports a new issue with his leg, describing difficulty when getting up after sitting for 20 minutes or more, with a sensation of needing to 'walk it out' for 15-30 seconds. No regular pain but occasional knee pain when descending stairs a few months ago, which has since resolved. He has been  wearing supportive footwear for a few weeks, which seems to help.  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 He has a Catering manager that he walks three times a day.  PMH: CAD on Eliquis , hypertension, paroxysmal A-fib, sleep apnea on CPAP, hypothyroidism, type 2 diabetes, obesity.  10/2023 cardiac catheterization  Past Medical History:  Diagnosis Date   At risk for sleep apnea    STOP-BANG= 6         SENT TO PCP 06-24-2016   Back pain    History of thyroid  nodule    s/p  right thyroid  lopectomy--  per path report:  follicular adenoma, chronic thyroiditis   Hyperlipidemia    Hypertension    Hypogonadism male    Hypothyroidism    Obesity    OSA on CPAP    moderate OSA with an AHI of 17.5/h on auto CPAP   Other specified disorders of tendon, right knee    quad tendon tear   PAF (paroxysmal atrial fibrillation) (HCC)    SOB (shortness of breath)    Vitamin D  deficiency    Wears glasses     Past Surgical History:  Procedure Laterality Date   ATRIAL FIBRILLATION ABLATION N/A 09/07/2023   Procedure: ATRIAL FIBRILLATION ABLATION;  Surgeon: Inocencio Soyla Lunger, MD;  Location: [facility] INVASIVE CV LAB;  Service: Cardiovascular;  Laterality: N/A;   CARDIOVERSION N/A 07/27/2023   Procedure: CARDIOVERSION (CATH LAB);  Surgeon: Barbaraann Darryle Ned, MD;  Location: [facility] INVASIVE CV LAB;  Service: Cardiovascular;  Laterality: N/A;  COLONOSCOPY  2006   CORONARY LITHOTRIPSY N/A 10/19/2023   Procedure: CORONARY LITHOTRIPSY;  Surgeon: Jordan, Peter M, MD;  Location: [facility] INVASIVE CV LAB;  Service: Cardiovascular;  Laterality: N/A;   CORONARY STENT INTERVENTION N/A 10/19/2023   Procedure: CORONARY STENT INTERVENTION;  Surgeon: Jordan, Peter M, MD;  Location: [facility] INVASIVE CV LAB;  Service: Cardiovascular;  Laterality: N/A;   EYE SURGERY Right age 29   removal glass   HYPOSPADIAS CORRECTION  age 67   IR CERVICAL/THORACIC DISC ASPIRATION W/IMAG GUIDE  03/31/2020       IR  CERVICAL/THORACIC DISC ASPIRATION W/IMAG GUIDE  03/31/2020   LEFT HEART CATH AND CORONARY ANGIOGRAPHY N/A 10/19/2023   Procedure: LEFT HEART CATH AND CORONARY ANGIOGRAPHY;  Surgeon: Jordan, Peter M, MD;  Location: [facility] INVASIVE CV LAB;  Service: Cardiovascular;  Laterality: N/A;   QUADRICEPS TENDON REPAIR Left 06/28/2016   Procedure: LEFT KNEE REPAIR QUADRICEP TENDON;  Surgeon: Lamar Collet, MD;  Location: [facility] [place];  Service: Orthopedics;  Laterality: Left;   THYROID  LOBECTOMY Right 02/03/2005    Prior to Admission medications   Medication Sig Start Date End Date Taking? Authorizing Provider  acetaminophen  (TYLENOL ) 325 MG tablet Take 650 mg by mouth in the morning and at bedtime.    [provider]  apixaban  (ELIQUIS ) 5 MG TABS tablet Take 1 tablet by mouth twice daily 07/08/24   Plotnikov, Aleksei V, MD  Ascorbic Acid  (VITAMIN C  WITH ROSE HIPS) 1000 MG tablet Take 1,000 mg by mouth daily in the afternoon.    [provider]  benazepril  (LOTENSIN ) 20 MG tablet Take 1 tablet by mouth once daily 02/28/24   Plotnikov, Aleksei V, MD  Cholecalciferol  (VITAMIN D3) 50 MCG (2000 UT) TABS Take 2,000 Units by mouth every evening.    [provider]  Coenzyme Q10 (COQ10) 100 MG CAPS Take 100 mg by mouth every morning.    [provider]  Cyanocobalamin  (B-12) 2000 MCG TABS Take 2,000 mcg by mouth every morning. Patient not taking: Reported on 04/30/2024    [provider]  levothyroxine  (SYNTHROID ) 125 MCG tablet TAKE 2 TABLETS BY MOUTH BEFORE BREAKFAST DAILY 02/28/24   Plotnikov, Aleksei V, MD  metoprolol  tartrate (LOPRESSOR ) 25 MG tablet Take 1 tablet (25 mg total) by mouth 2 (two) times daily. 08/09/23 04/30/24  Jerilynn Lamarr HERO, NP  Multiple Vitamin (MULTIVITAMIN) tablet Take 1 tablet by mouth daily with breakfast.    [provider]  nitroGLYCERIN  (NITROSTAT ) 0.4 MG SL tablet Place 1 tablet (0.4 mg total) under the tongue every 5  (five) minutes as needed for chest pain. 10/19/23 10/18/24  Madie Jon Garre, PA  OVER THE COUNTER MEDICATION Take 2 capsules by mouth in the morning and at bedtime. [employer] Star Testosterone  Booster Has calcium , sodium, rodeola extract, gingko extract, and citrate    [provider]  OZEMPIC , 1 MG/DOSE, 4 MG/3ML SOPN INJECT 1 MG SUBCUTANEOUS ONCE A WEEK AS DIRECTED Patient not taking: Reported on 04/30/2024 04/26/24   Plotnikov, Aleksei V, MD  rosuvastatin  (CRESTOR ) 20 MG tablet Take 1 tablet (20 mg total) by mouth daily. 10/19/23 10/18/24  Madie Jon Garre, PA  RYBELSUS  14 MG TABS Take 1 tablet (14 mg total) by mouth daily. 07/01/24   Plotnikov, Aleksei V, MD  triamterene -hydrochlorothiazide  (MAXZIDE -25) 37.5-25 MG tablet Take 1 tablet by mouth once daily 02/28/24   Plotnikov, Aleksei V, MD    Family History  Problem Relation Age of Onset   Mental illness Mother  dementia   Hypertension Mother    Hyperlipidemia Mother    Heart disease Mother    Thyroid  disease Mother    Depression Mother    Anxiety disorder Mother    Schizophrenia Mother    Arthritis Father    COPD Father    Hypertension Father    Hyperlipidemia Father    Heart disease Father    Thyroid  disease Father    Sleep apnea Father    Alcohol abuse Father    Liver disease Father      Social History   Tobacco Use   Smoking status: Never   Smokeless tobacco: Never   Tobacco comments:    Never smoked 10/02/23  Vaping Use   Vaping status: Never Used  Substance Use Topics   Alcohol use: Not Currently    Alcohol/week: 1.0 standard drink of alcohol    Types: 1 Cans of beer per week    Comment: occasional   Drug use: No    Allergies as of 07/17/2024 - Review Complete 05/05/2024  Allergen Reaction Noted   Penicillins Other (See Comments) 02/19/2020   Watermelon [citrullus vulgaris] Itching and Other (See Comments) 02/19/2020    Review of Systems:    All systems reviewed and negative except where noted in  HPI.   Physical Exam:  There were no vitals taken for this visit. No LMP for male patient.  General:   Alert,  Well-developed, well-nourished, pleasant and cooperative in NAD Lungs:  Respirations even and unlabored.  Clear throughout to auscultation.   No wheezes, crackles, or rhonchi. No acute distress. Heart:  Regular rate and rhythm; no murmurs, clicks, rubs, or gallops. Abdomen:  Normal bowel sounds.  No bruits.  Soft, and non-distended without masses, hepatosplenomegaly or hernias noted.  No Tenderness.  No guarding or rebound tenderness.    Neurologic:  Alert and oriented x3;  grossly normal neurologically. Psych:  Alert and cooperative. Normal mood and affect.   Imaging Studies: No results found.  Labs: CBC    Component Value Date/Time   WBC 4.4 01/04/2024 1410   RBC 4.72 01/04/2024 1410   HGB 14.7 01/04/2024 1410   HGB 14.7 10/17/2023 1657   HCT 43.3 01/04/2024 1410   HCT 42.9 10/17/2023 1657   PLT 239.0 01/04/2024 1410   PLT 243 10/17/2023 1657   MCV 91.8 01/04/2024 1410   MCV 92 10/17/2023 1657    CMP     Component Value Date/Time   NA 136 01/04/2024 1410   NA 138 10/17/2023 1657   K 4.1 01/04/2024 1410   CL 100 01/04/2024 1410   CO2 30 01/04/2024 1410   GLUCOSE 87 01/04/2024 1410   BUN 16 01/04/2024 1410   BUN 17 10/17/2023 1657   CREATININE 1.05 01/04/2024 1410   CALCIUM  9.4 01/04/2024 1410   PROT 6.6 01/04/2024 1410   PROT 6.6 02/20/2020 1136   ALBUMIN 4.2 01/04/2024 1410   ALBUMIN 3.7 (L) 02/20/2020 1136   AST 20 01/04/2024 1410   ALT 29 01/04/2024 1410   ALKPHOS 49 01/04/2024 1410   BILITOT 0.8 01/04/2024 1410   BILITOT 0.4 02/20/2020 1136   GFRNONAA >60 05/23/2023 1910   GFRAA >60 04/28/2020 0155    Assessment and Plan:   Randy [name] is a 63 y.o. y/o male has been referred for   1.  Positive screening Cologuard 2.  History of adenomatous colon polyp 3.  Comorbidities:  Plan: - Scheduling Colonoscopy I discussed risks of  colonoscopy with patient to include  risk of bleeding, colon perforation, and risk of sedation.  Patient expressed understanding and agrees to proceed with colonoscopy.  - Request permission to hold Eliquis  2 days prior to colonoscopy procedure.  Assessment and Plan Assessment & Plan Colorectal cancer screening with positive Cologuard and history of tubular adenoma Positive Cologuard and history of adenoma necessitate colonoscopy to evaluate for malignancy. - Scheduled colonoscopy before year-end. - Instructed to stop Eliquis  two days before and resume two days after colonoscopy. - Continue metoprolol . - Use Suprep for bowel prep. - Follow liquid diet day before, avoid red/thick liquids. - Plan sedation with propofol . - Allow soft foods day after.  Atrial fibrillation, status post ablation On Eliquis  for anticoagulation; coordination with cardiologist required for peri-procedural management. - Coordinated with cardiologist regarding Eliquis  management around colonoscopy.  Coronary artery disease, status post heart catheterization Recent catheterization in February; no current symptoms. - Continue current management and follow-up with cardiologist.  Obesity on Rybelsus  with significant weight loss 75-pound weight loss with Rybelsus ; considering Ozempic  for further management. - Continue Rybelsus , consider transition to Ozempic  pending insurance approval.  Prediabetes Managed with Rybelsus ; aim to prevent diabetes progression. - Continue Rybelsus , monitor blood glucose levels.  Hypertension Managed with metoprolol . - Continue metoprolol  as prescribed.  Right knee pain, possible meniscal or intra-articular pathology Intermittent pain, possible meniscal tear or Baker's cyst; improves with supportive measures. - Discuss knee pain with primary care physician. - Consider MRI if symptoms persist or worsen.      Follow up ***  Ellouise Console, PA-C

## 2024-07-17 NOTE — Telephone Encounter (Signed)
 Pharmacy please advise on holding Eliquis  prior to colonoscopy scheduled for 08/15/2024. Last labs 01/04/2024. Thank you.

## 2024-07-17 NOTE — Patient Instructions (Signed)
 You have been scheduled for a Colonoscopy. Please follow written instructions given to you at your visit today.   If you use inhalers (even only as needed), please bring them with you on the day of your procedure.  DO NOT TAKE 7 DAYS PRIOR TO TEST- Trulicity (dulaglutide) Ozempic , Wegovy  (semaglutide ) Mounjaro (tirzepatide) Bydureon Bcise (exanatide extended release)  DO NOT TAKE 1 DAY PRIOR TO YOUR TEST Rybelsus  (semaglutide ) Adlyxin (lixisenatide) Victoza (liraglutide) Byetta (exanatide) ___________________________________________________________________________  Please follow up sooner if symptoms increase or worsen   Due to recent changes in healthcare laws, you may see the results of your imaging and laboratory studies on MyChart before your provider has had a chance to review them.  We understand that in some cases there may be results that are confusing or concerning to you. Not all laboratory results come back in the same time frame and the provider may be waiting for multiple results in order to interpret others.  Please give us  48 hours in order for your provider to thoroughly review all the results before contacting the office for clarification of your results.   Thank you for trusting me with your gastrointestinal care!   Ellouise Console, PA-C _______________________________________________________  If your blood pressure at your visit was 140/90 or greater, please contact your primary care physician to follow up on this.  _______________________________________________________  If you are age 64 or older, your body mass index should be between 23-30. Your Body mass index is 41.64 kg/m. If this is out of the aforementioned range listed, please consider follow up with your Primary Care Provider.  If you are age 36 or younger, your body mass index should be between 19-25. Your Body mass index is 41.64 kg/m. If this is out of the aformentioned range listed, please consider  follow up with your Primary Care Provider.   ________________________________________________________  The [facility] providers would like to encourage you to use MYCHART to communicate with providers for non-urgent requests or questions.  Due to long hold times on the telephone, sending your provider a message by [facility] may be a faster and more efficient way to get a response.  Please allow 48 business hours for a response.  Please remember that this is for non-urgent requests.  _______________________________________________________

## 2024-07-17 NOTE — Telephone Encounter (Signed)
 [facility] Pre-operative Risk Assessment     Request for surgical clearance:     Endoscopy Procedure  What type of surgery is being performed?     Colonoscopy  When is this surgery scheduled?     08/15/24  What type of clearance is required ?   Pharmacy  Are there any medications that need to be held prior to surgery and how long? Eliquis  2 days  Practice name and name of physician performing surgery?      [facility]  What is your office phone and fax number?      Phone- [phone]  Fax- [phone]  Anesthesia type (None, local, MAC, general) ?       MAC   Please route your response to Alethea Blocker, CMA

## 2024-07-18 ENCOUNTER — Encounter: Payer: Self-pay | Admitting: Physician Assistant

## 2024-07-25 NOTE — Telephone Encounter (Signed)
 Patient with diagnosis of atrial fibrillation on Eliquis  for anticoagulation.    What type of surgery is being performed?     Colonoscopy  When is this surgery scheduled?     08/15/24    CHA2DS2-VASc Score = 3   This indicates a 3.2% annual risk of stroke. The patient's score is based upon: CHF History: 0 HTN History: 1 Diabetes History: 1 Stroke History: 0 Vascular Disease History: 1 Age Score: 0 Gender Score: 0    CrCl >100 Platelet count 239  Patient has not had an Afib/aflutter ablation in the last 3 months, DCCV within the last 4 weeks or a watchman implanted in the last 45 days   Per office protocol, patient can hold Eliquis  for 2 days prior to procedure.   Patient will not need bridging with Lovenox  (enoxaparin ) around procedure.  **This guidance is not considered finalized until pre-operative APP has relayed final recommendations.**

## 2024-07-26 NOTE — Telephone Encounter (Signed)
   Patient Name: Randy [name]  DOB: 06/20/1961 MRN: [id number]  Primary Cardiologist: Vina Gull, MD  Chart reviewed as part of pre-operative protocol coverage. Patient is scheduled to have a colonoscopy on 08/15/2024 and we were asked to give our recommendations for holding Eliquis .  Per Pharmacy and office protocol: Patient can hold Eliquis  for 2 days prior to procedure.  Patient will not need bridging with Lovenox  (enoxaparin ) around procedure.  I will route this recommendation to the requesting party via Epic fax function and remove from pre-op pool.  Please call with questions.  Adamariz Gillott E Kristapher [place], PA-C 07/26/2024, 7:02 AM

## 2024-07-27 ENCOUNTER — Other Ambulatory Visit: Payer: Self-pay | Admitting: Adult Health

## 2024-07-30 ENCOUNTER — Encounter: Payer: Self-pay | Admitting: Internal Medicine

## 2024-08-05 ENCOUNTER — Other Ambulatory Visit (HOSPITAL_COMMUNITY): Payer: Self-pay

## 2024-08-07 ENCOUNTER — Encounter: Payer: Self-pay | Admitting: Internal Medicine

## 2024-08-07 ENCOUNTER — Ambulatory Visit: Admitting: Internal Medicine

## 2024-08-07 VITALS — BP 116/58 | HR 75 | Temp 98.1°F | Ht 72.0 in

## 2024-08-07 DIAGNOSIS — E119 Type 2 diabetes mellitus without complications: Secondary | ICD-10-CM

## 2024-08-07 DIAGNOSIS — I2510 Atherosclerotic heart disease of native coronary artery without angina pectoris: Secondary | ICD-10-CM

## 2024-08-07 DIAGNOSIS — E039 Hypothyroidism, unspecified: Secondary | ICD-10-CM | POA: Diagnosis not present

## 2024-08-07 DIAGNOSIS — I10 Essential (primary) hypertension: Secondary | ICD-10-CM | POA: Diagnosis not present

## 2024-08-07 DIAGNOSIS — M25561 Pain in right knee: Secondary | ICD-10-CM

## 2024-08-07 DIAGNOSIS — M25562 Pain in left knee: Secondary | ICD-10-CM

## 2024-08-07 DIAGNOSIS — G8929 Other chronic pain: Secondary | ICD-10-CM

## 2024-08-07 DIAGNOSIS — Z7985 Long-term (current) use of injectable non-insulin antidiabetic drugs: Secondary | ICD-10-CM

## 2024-08-07 DIAGNOSIS — I2583 Coronary atherosclerosis due to lipid rich plaque: Secondary | ICD-10-CM

## 2024-08-07 DIAGNOSIS — I480 Paroxysmal atrial fibrillation: Secondary | ICD-10-CM

## 2024-08-07 NOTE — Assessment & Plan Note (Signed)
 R post knee pain x6 weeks, he had a knee sprain a couple months ago Blue emu, elastic brace Call f not well in 2 wks

## 2024-08-07 NOTE — Assessment & Plan Note (Signed)
 Probably ok to d/c Plavix  now - check w/Cardiology

## 2024-08-07 NOTE — Progress Notes (Signed)
 Subjective:  Patient ID: Randy [name], male    DOB: 06/25/1961  Age: 63 y.o. MRN: [id number]  CC: Medical Management of Chronic Issues (3 Month follow up)   HPI Randy [name] presents for a f/u on DM, HTN, CAD C/o R post knee pain x6 weeks, he had a knee sprain a couple months ago Started Ozempic  1 wk ago On Eliquis   Outpatient Medications Prior to Visit  Medication Sig Dispense Refill   acetaminophen  (TYLENOL ) 325 MG tablet Take 650 mg by mouth in the morning and at bedtime.     apixaban  (ELIQUIS ) 5 MG TABS tablet Take 1 tablet by mouth twice daily 60 tablet 5   Ascorbic Acid  (VITAMIN C  WITH ROSE HIPS) 1000 MG tablet Take 1,000 mg by mouth daily in the afternoon.     benazepril  (LOTENSIN ) 20 MG tablet Take 1 tablet by mouth once daily 90 tablet 3   Cholecalciferol  (VITAMIN D3) 50 MCG (2000 UT) TABS Take 2,000 Units by mouth every evening.     Coenzyme Q10 (COQ10) 100 MG CAPS Take 100 mg by mouth every morning.     levothyroxine  (SYNTHROID ) 125 MCG tablet TAKE 2 TABLETS BY MOUTH BEFORE BREAKFAST DAILY 180 tablet 3   metoprolol  tartrate (LOPRESSOR ) 25 MG tablet Take 1 tablet by mouth twice daily 180 tablet 1   Multiple Vitamin (MULTIVITAMIN) tablet Take 1 tablet by mouth daily with breakfast.     nitroGLYCERIN  (NITROSTAT ) 0.4 MG SL tablet Place 1 tablet (0.4 mg total) under the tongue every 5 (five) minutes as needed for chest pain. 25 tablet 3   OVER THE COUNTER MEDICATION Take 2 capsules by mouth in the morning and at bedtime. Six Star Testosterone  Booster Has calcium , sodium, rodeola extract, gingko extract, and citrate     OZEMPIC , 1 MG/DOSE, 4 MG/3ML SOPN INJECT 1 MG SUBCUTANEOUS ONCE A WEEK AS DIRECTED 3 mL 5   rosuvastatin  (CRESTOR ) 20 MG tablet Take 1 tablet (20 mg total) by mouth daily. 90 tablet 3   triamterene -hydrochlorothiazide  (MAXZIDE -25) 37.5-25 MG tablet Take 1 tablet by mouth once daily 90 tablet 3   RYBELSUS  14 MG TABS Take 1 tablet (14 mg total) by mouth daily. 30  tablet 3   Cyanocobalamin  (B-12) 2000 MCG TABS Take 2,000 mcg by mouth every morning. (Patient not taking: Reported on 08/07/2024)     No facility-administered medications prior to visit.    ROS: Review of Systems  Constitutional:  Negative for appetite change, fatigue and unexpected weight change.  HENT:  Negative for congestion, nosebleeds, sneezing, sore throat and trouble swallowing.   Eyes:  Negative for itching and visual disturbance.  Respiratory:  Negative for cough.   Cardiovascular:  Negative for chest pain, palpitations and leg swelling.  Gastrointestinal:  Negative for abdominal distention, blood in stool, diarrhea and nausea.  Genitourinary:  Negative for frequency and hematuria.  Musculoskeletal:  Negative for back pain, gait problem, joint swelling and neck pain.  Skin:  Negative for rash.  Neurological:  Negative for dizziness, tremors, speech difficulty and weakness.  Hematological:  Bruises/bleeds easily.  Psychiatric/Behavioral:  Negative for agitation, decreased concentration, dysphoric mood, sleep disturbance and suicidal ideas. The patient is not nervous/anxious.     Objective:  BP (!) 116/58   Pulse 75   Temp 98.1 F (36.7 C)   Ht 6' (1.829 m)   SpO2 96%   BMI 41.64 kg/m   BP Readings from Last 3 Encounters:  08/07/24 (!) 116/58  07/17/24 (!) 180/90  04/30/24 120/76    Wt Readings from Last 3 Encounters:  07/17/24 (!) 307 lb (139.3 kg)  04/30/24 (!) 310 lb (140.6 kg)  04/25/24 (!) 310 lb (140.6 kg)    Physical Exam Constitutional:      General: He is not in acute distress.    Appearance: He is well-developed. He is obese.     Comments: NAD  Eyes:     Conjunctiva/sclera: Conjunctivae normal.     Pupils: Pupils are equal, round, and reactive to light.  Neck:     Thyroid : No thyromegaly.     Vascular: No JVD.  Cardiovascular:     Rate and Rhythm: Normal rate and regular rhythm.     Heart sounds: Normal heart sounds. No murmur heard.    No  friction rub. No gallop.  Pulmonary:     Effort: Pulmonary effort is normal. No respiratory distress.     Breath sounds: Normal breath sounds. No wheezing or rales.  Chest:     Chest wall: No tenderness.  Abdominal:     General: Bowel sounds are normal. There is no distension.     Palpations: Abdomen is soft. There is no mass.     Tenderness: There is no abdominal tenderness. There is no guarding or rebound.  Musculoskeletal:        General: No tenderness. Normal range of motion.     Cervical back: Normal range of motion.     Right lower leg: Edema present.     Left lower leg: Edema present.  Lymphadenopathy:     Cervical: No cervical adenopathy.  Skin:    General: Skin is warm and dry.     Findings: No rash.  Neurological:     Mental Status: He is alert and oriented to person, place, and time.     Cranial Nerves: No cranial nerve deficit.     Motor: No abnormal muscle tone.     Coordination: Coordination normal.     Gait: Gait normal.     Deep Tendon Reflexes: Reflexes are normal and symmetric.  Psychiatric:        Behavior: Behavior normal.        Thought Content: Thought content normal.        Judgment: Judgment normal.   Trace edema B Posterior knee is not tender, no bulge   Lab Results  Component Value Date   WBC 4.4 01/04/2024   HGB 14.7 01/04/2024   HCT 43.3 01/04/2024   PLT 239.0 01/04/2024   GLUCOSE 87 01/04/2024   CHOL 110 02/08/2023   TRIG 67.0 02/08/2023   HDL 32.50 (L) 02/08/2023   LDLCALC 64 02/08/2023   ALT 29 01/04/2024   AST 20 01/04/2024   NA 136 01/04/2024   K 4.1 01/04/2024   CL 100 01/04/2024   CREATININE 1.05 01/04/2024   BUN 16 01/04/2024   CO2 30 01/04/2024   TSH 0.08 (L) 01/04/2024   PSA 0.73 01/04/2024   INR 1.2 05/17/2023   HGBA1C 5.2 01/04/2024    CARDIAC CATHETERIZATION Result Date: 10/19/2023   Mid LAD lesion is 99% stenosed.   A drug-eluting stent was successfully placed using a SYNERGY XD 2.75X12.   Post intervention, there  is a 0% residual stenosis.   LV end diastolic pressure is normal.   Recommend to resume Apixaban , at currently prescribed dose and frequency on 10/19/2023.   Recommend concurrent antiplatelet therapy of Aspirin  81 mg for 1 month and Clopidogrel  75mg  daily for 6 months . Single vessel  occlusive CAD involving the mid LAD Normal LVEDP Successful PCI of the mid LAD with Shockwave lithotripsy and stenting with DES Plan: may resume Eliquis  this evening. ASA 81 mg for one month and Plavix  75 mg daily for 6 months. Anticipate same day DC    Assessment & Plan:   Problem List Items Addressed This Visit     CAD (coronary artery disease)   Probably ok to d/c Plavix  now - check w/Cardiology      Essential hypertension - Primary   Continue with weight loss.  Continue with current therapy: Amlodipine , benazepril , Maxide      Hypothyroidism   Chronic  On Levothroid      Knee pain   R post knee pain x6 weeks, he had a knee sprain a couple months ago Blue emu, elastic brace Call f not well in 2 wks      PAF (paroxysmal atrial fibrillation) (HCC)   On Eliquis  Rate is controlled on Toprol , s/p ablation      Type 2 diabetes mellitus (HCC)   Ozempic  is suggested instead - start w/1 mg per week 08/07/24 Started Ozempic  1 wk ago RTC 3 mo w/labs       Relevant Orders   Hemoglobin A1c   Comprehensive metabolic panel with GFR      No orders of the defined types were placed in this encounter.     Follow-up: Return in about 3 months (around 11/07/2024) for a follow-up visit.  Marolyn Noel, MD

## 2024-08-07 NOTE — Assessment & Plan Note (Signed)
Continue with weight loss.  Continue with current therapy: Amlodipine, benazepril, Maxide

## 2024-08-07 NOTE — Assessment & Plan Note (Addendum)
 Ozempic  is suggested instead - start w/1 mg per week 08/07/24 Started Ozempic  1 wk ago RTC 3 mo w/labs

## 2024-08-07 NOTE — Assessment & Plan Note (Signed)
 On Eliquis  Rate is controlled on Toprol , s/p ablation

## 2024-08-07 NOTE — Assessment & Plan Note (Signed)
 Chronic On Levothroid -

## 2024-08-15 ENCOUNTER — Ambulatory Visit: Admitting: Internal Medicine

## 2024-08-15 ENCOUNTER — Encounter: Payer: Self-pay | Admitting: Internal Medicine

## 2024-08-15 VITALS — BP 97/58 | HR 69 | Temp 97.9°F | Resp 14 | Ht 72.0 in | Wt 307.0 lb

## 2024-08-15 DIAGNOSIS — D123 Benign neoplasm of transverse colon: Secondary | ICD-10-CM

## 2024-08-15 DIAGNOSIS — R195 Other fecal abnormalities: Secondary | ICD-10-CM

## 2024-08-15 MED ORDER — SODIUM CHLORIDE 0.9 % IV SOLN: 500.0000 mL | Freq: Once | INTRAVENOUS | Status: DC

## 2024-08-15 NOTE — Op Note (Signed)
 [facility] Patient Name: Randy [name] Procedure Date: 08/15/2024 2:37 PM MRN: [id number] Endoscopist: Lupita FORBES Commander , MD, [id number] Age: 63 Referring MD:  Date of Birth: 1961-04-22 Gender: Male Account #: [id number] Procedure:                Colonoscopy Indications:              Positive Cologuard test Medicines:                Monitored Anesthesia Care Procedure:                Pre-Anesthesia Assessment:                           - Prior to the procedure, a History and Physical                            was performed, and patient medications and                            allergies were reviewed. The patient's tolerance of                            previous anesthesia was also reviewed. The risks                            and benefits of the procedure and the sedation                            options and risks were discussed with the patient.                            All questions were answered, and informed consent                            was obtained. Prior Anticoagulants: The patient                            last took Eliquis  (apixaban ) 2 days prior to the                            procedure. ASA Grade Assessment: III - A patient                            with severe systemic disease. After reviewing the                            risks and benefits, the patient was deemed in                            satisfactory condition to undergo the procedure.                           After obtaining informed consent, the colonoscope  was passed under direct vision. Throughout the                            procedure, the patient's blood pressure, pulse, and                            oxygen saturations were monitored continuously. The                            CF [id number] #[id number] was introduced through the anus                            and advanced to the the cecum, identified by                            appendiceal orifice and  ileocecal valve. The                            colonoscopy was performed without difficulty. The                            patient tolerated the procedure well. The quality                            of the bowel preparation was adequate. The                            ileocecal valve, appendiceal orifice, and rectum                            were photographed. The bowel preparation used was                            SUPREP via split dose instruction. Scope In: 2:54:54 PM Scope Out: 3:07:42 PM Scope Withdrawal Time: 0 hours 9 minutes 4 seconds  Total Procedure Duration: 0 hours 12 minutes 48 seconds  Findings:                 The perianal and digital rectal examinations were                            normal. Pertinent negatives include normal prostate                            (size, shape, and consistency).                           Three sessile polyps were found in the transverse                            colon. The polyps were diminutive in size. These                            polyps were removed with a cold snare. Resection  and retrieval were complete. Verification of                            patient identification for the specimen was done.                            Estimated blood loss was minimal.                           Anal papilla(e) were hypertrophied.                           The exam was otherwise without abnormality on                            direct and retroflexion views. Complications:            No immediate complications. Estimated Blood Loss:     Estimated blood loss was minimal. Impression:               - Three diminutive polyps in the transverse colon,                            removed with a cold snare. Resected and retrieved.                           - Anal papilla(e) were hypertrophied.                           - The examination was otherwise normal on direct                            and retroflexion views.                            - Personal history of colonic polyp 2004-7 mm                            adenoma removed. Recommendation:           - Patient has a contact number available for                            emergencies. The signs and symptoms of potential                            delayed complications were discussed with the                            patient. Return to normal activities tomorrow.                            Written discharge instructions were provided to the                            patient.                           -  Resume previous diet.                           - Continue present medications.                           - Await pathology results.                           - Repeat colonoscopy is recommended for                            surveillance. The colonoscopy date will be                            determined after pathology results from today's                            exam become available for review. Lupita FORBES Commander, MD 08/15/2024 3:14:45 PM This report has been signed electronically.

## 2024-08-15 NOTE — Progress Notes (Signed)
 Called to room to assist during endoscopic procedure.  Patient ID and intended procedure confirmed with present staff. Received instructions for my participation in the procedure from the performing physician.

## 2024-08-15 NOTE — Progress Notes (Signed)
 [facility] History and Physical   Primary Care Physician:  Garald Karlynn GAILS, MD   Reason for Procedure:    Encounter Diagnosis  Name Primary?   Positive colorectal cancer screening using Cologuard test Yes     Plan:    Colonoscopy   The patient was provided an opportunity to ask questions and all were answered. The patient agreed with the plan.   HPI: Randy [name] is a 63 y.o. male here for evaluation of abnormal Cologuard.  He uses Eliquis  because of atrial fibrillation but that has been held. In 2004 I performed a colonoscopy and removed a 7 mm adenoma from the hepatic flexure.  Past Medical History:  Diagnosis Date   At risk for sleep apnea    STOP-BANG= 6         SENT TO PCP 06-24-2016   Back pain    History of thyroid  nodule    s/p  right thyroid  lopectomy--  per path report:  follicular adenoma, chronic thyroiditis   Hyperlipidemia    Hypertension    Hypogonadism male    Hypothyroidism    Obesity    OSA on CPAP    moderate OSA with an AHI of 17.5/h on auto CPAP   Other specified disorders of tendon, right knee    quad tendon tear   PAF (paroxysmal atrial fibrillation) (HCC)    SOB (shortness of breath)    Vitamin D  deficiency    Wears glasses     Past Surgical History:  Procedure Laterality Date   ATRIAL FIBRILLATION ABLATION N/A 09/07/2023   Procedure: ATRIAL FIBRILLATION ABLATION;  Surgeon: Inocencio Soyla Lunger, MD;  Location: [facility] INVASIVE CV LAB;  Service: Cardiovascular;  Laterality: N/A;   CARDIOVERSION N/A 07/27/2023   Procedure: CARDIOVERSION (CATH LAB);  Surgeon: Barbaraann Darryle Ned, MD;  Location: [facility] INVASIVE CV LAB;  Service: Cardiovascular;  Laterality: N/A;   COLONOSCOPY  2006   CORONARY LITHOTRIPSY N/A 10/19/2023   Procedure: CORONARY LITHOTRIPSY;  Surgeon: Jordan, Peter M, MD;  Location: [facility] INVASIVE CV LAB;  Service: Cardiovascular;  Laterality: N/A;   CORONARY STENT INTERVENTION N/A 10/19/2023   Procedure: CORONARY STENT  INTERVENTION;  Surgeon: Jordan, Peter M, MD;  Location: [facility] INVASIVE CV LAB;  Service: Cardiovascular;  Laterality: N/A;   EYE SURGERY Right age 36   removal glass   HYPOSPADIAS CORRECTION  age 18   IR CERVICAL/THORACIC DISC ASPIRATION W/IMAG GUIDE  03/31/2020       IR CERVICAL/THORACIC DISC ASPIRATION W/IMAG GUIDE  03/31/2020   LEFT HEART CATH AND CORONARY ANGIOGRAPHY N/A 10/19/2023   Procedure: LEFT HEART CATH AND CORONARY ANGIOGRAPHY;  Surgeon: Jordan, Peter M, MD;  Location: [facility] INVASIVE CV LAB;  Service: Cardiovascular;  Laterality: N/A;   QUADRICEPS TENDON REPAIR Left 06/28/2016   Procedure: LEFT KNEE REPAIR QUADRICEP TENDON;  Surgeon: Lamar Collet, MD;  Location: [facility];  Service: Orthopedics;  Laterality: Left;   THYROID  LOBECTOMY Right 02/03/2005     Current Outpatient Medications  Medication Sig Dispense Refill   acetaminophen  (TYLENOL ) 325 MG tablet Take 650 mg by mouth in the morning and at bedtime.     apixaban  (ELIQUIS ) 5 MG TABS tablet Take 1 tablet by mouth twice daily 60 tablet 5   Ascorbic Acid  (VITAMIN C  WITH ROSE HIPS) 1000 MG tablet Take 1,000 mg by mouth daily in the afternoon.     benazepril  (LOTENSIN ) 20 MG tablet Take 1 tablet by mouth once daily 90 tablet 3   Cholecalciferol  (VITAMIN  D3) 50 MCG (2000 UT) TABS Take 2,000 Units by mouth every evening.     Coenzyme Q10 (COQ10) 100 MG CAPS Take 100 mg by mouth every morning.     levothyroxine  (SYNTHROID ) 125 MCG tablet TAKE 2 TABLETS BY MOUTH BEFORE BREAKFAST DAILY 180 tablet 3   metoprolol  tartrate (LOPRESSOR ) 25 MG tablet Take 1 tablet by mouth twice daily 180 tablet 1   Multiple Vitamin (MULTIVITAMIN) tablet Take 1 tablet by mouth daily with breakfast.     OVER THE COUNTER MEDICATION Take 2 capsules by mouth in the morning and at bedtime. Six Star Testosterone  Booster Has calcium , sodium, rodeola extract, gingko extract, and citrate     rosuvastatin  (CRESTOR ) 20 MG tablet Take 1 tablet (20 mg total)  by mouth daily. 90 tablet 3   triamterene -hydrochlorothiazide  (MAXZIDE -25) 37.5-25 MG tablet Take 1 tablet by mouth once daily 90 tablet 3   Cyanocobalamin  (B-12) 2000 MCG TABS Take 2,000 mcg by mouth every morning. (Patient not taking: No sig reported)     nitroGLYCERIN  (NITROSTAT ) 0.4 MG SL tablet Place 1 tablet (0.4 mg total) under the tongue every 5 (five) minutes as needed for chest pain. 25 tablet 3   OZEMPIC , 1 MG/DOSE, 4 MG/3ML SOPN INJECT 1 MG SUBCUTANEOUS ONCE A WEEK AS DIRECTED 3 mL 5   Current Facility-Administered Medications  Medication Dose Route Frequency Provider Last Rate Last Admin   0.9 %  sodium chloride  infusion  500 mL Intravenous Once Avram Lupita BRAVO, MD        Allergies as of 08/15/2024 - Review Complete 08/15/2024  Allergen Reaction Noted   Penicillins Other (See Comments) 02/19/2020   Watermelon [citrullus vulgaris] Itching and Other (See Comments) 02/19/2020    Family History  Problem Relation Age of Onset   Mental illness Mother        dementia   Hypertension Mother    Hyperlipidemia Mother    Heart disease Mother    Thyroid  disease Mother    Depression Mother    Anxiety disorder Mother    Schizophrenia Mother    Arthritis Father    COPD Father    Hypertension Father    Hyperlipidemia Father    Heart disease Father    Thyroid  disease Father    Sleep apnea Father    Alcohol abuse Father    Liver disease Father     Social History   Socioeconomic History   Marital status: Divorced    Spouse name: Not on file   Number of children: Not on file   Years of education: Not on file   Highest education level: Professional school degree (e.g., MD, DDS, DVM, JD)  Occupational History   Occupation: runner, broadcasting/film/video  Tobacco Use   Smoking status: Never   Smokeless tobacco: Never   Tobacco comments:    Never smoked 10/02/23  Vaping Use   Vaping status: Never Used  Substance and Sexual Activity   Alcohol use: Not Currently    Alcohol/week: 1.0 standard  drink of alcohol    Types: 1 Cans of beer per week    Comment: occasional   Drug use: No   Sexual activity: Not on file  Other Topics Concern   Not on file  Social History Narrative   Not on file   Social Drivers of Health   Financial Resource Strain: Low Risk  (04/25/2024)   Overall Financial Resource Strain (CARDIA)    Difficulty of Paying Living Expenses: Not hard at all  Food Insecurity: No Food  Insecurity (04/25/2024)   Hunger Vital Sign    Worried About Running Out of Food in the Last Year: Never true    Ran Out of Food in the Last Year: Never true  Transportation Needs: No Transportation Needs (04/25/2024)   PRAPARE - Administrator, Civil Service (Medical): No    Lack of Transportation (Non-Medical): No  Physical Activity: Insufficiently Active (04/25/2024)   Exercise Vital Sign    Days of Exercise per Week: 3 days    Minutes of Exercise per Session: 30 min  Stress: No Stress Concern Present (04/25/2024)   [employer] of Occupational Health - Occupational Stress Questionnaire    Feeling of Stress: Not at all  Social Connections: Moderately Integrated (04/25/2024)   Social Connection and Isolation Panel    Frequency of Communication with Friends and Family: More than three times a week    Frequency of Social Gatherings with Friends and Family: Twice a week    Attends Religious Services: 1 to 4 times per year    Active Member of [employer] or Organizations: Yes    Attends Engineer, Structural: More than 4 times per year    Marital Status: Divorced  Intimate Partner Violence: Not At Risk (05/18/2023)   Humiliation, Afraid, Rape, and Kick questionnaire    Fear of Current or Ex-Partner: No    Emotionally Abused: No    Physically Abused: No    Sexually Abused: No    Review of Systems:  All other review of systems negative except as mentioned in the HPI.  Physical Exam: Vital signs BP 128/60   Pulse 68   Temp 97.9 F (36.6 C)   Ht 6' (1.829 m)    Wt (!) 307 lb (139.3 kg)   SpO2 96%   BMI 41.64 kg/m   General:   Alert,  Well-developed, well-nourished, pleasant and cooperative in NAD Lungs:  Clear throughout to auscultation.   Heart:  Regular rate and rhythm; no murmurs, clicks, rubs,  or gallops. Abdomen:  Soft, nontender and nondistended. Normal bowel sounds.   Neuro/Psych:  Alert and cooperative. Normal mood and affect. A and O x 3   @Taiwan Millon  CHARLENA Commander, MD, [facility] [phone] (pager) 08/15/2024 2:46 PM@

## 2024-08-15 NOTE — Progress Notes (Signed)
 Report to PACU, RN, vss, BBS= Clear.

## 2024-08-15 NOTE — Patient Instructions (Addendum)
 I found and removed 3 small polyps that look benign but precancerous.  I will have them analyzed and let you know what they were and when to repeat a colonoscopy.  Probably in 5 years.  You should repeat a colonoscopy and not Cologuard.  Exam otherwise normal.  Restart Eliquis  tomorrow.  I appreciate the opportunity to care for you. Lupita CHARLENA Commander, MD, FACG   YOU HAD AN ENDOSCOPIC PROCEDURE TODAY AT THE [facility]:   Refer to the procedure report that was given to you for any specific questions about what was found during the examination.  If the procedure report does not answer your questions, please call your gastroenterologist to clarify.  If you requested that your care partner not be given the details of your procedure findings, then the procedure report has been included in a sealed envelope for you to review at your convenience later.  YOU SHOULD EXPECT: Some feelings of bloating in the abdomen. Passage of more gas than usual.  Walking can help get rid of the air that was put into your GI tract during the procedure and reduce the bloating. If you had a lower endoscopy (such as a colonoscopy or flexible sigmoidoscopy) you may notice spotting of blood in your stool or on the toilet paper. If you underwent a bowel prep for your procedure, you may not have a normal bowel movement for a few days.  Please Note:  You might notice some irritation and congestion in your nose or some drainage.  This is from the oxygen used during your procedure.  There is no need for concern and it should clear up in a day or so.  SYMPTOMS TO REPORT IMMEDIATELY:  Following lower endoscopy (colonoscopy or flexible sigmoidoscopy):  Excessive amounts of blood in the stool  Significant tenderness or worsening of abdominal pains  Swelling of the abdomen that is new, acute  Fever of 100F or higher  For urgent or emergent issues, a gastroenterologist can be reached at any hour by calling [phone]  [phone]. Do not use MyChart messaging for urgent concerns.    DIET:  We do recommend a small meal at first, but then you may proceed to your regular diet.  Drink plenty of fluids but you should avoid alcoholic beverages for 24 hours.  ACTIVITY:  You should plan to take it easy for the rest of today and you should NOT DRIVE or use heavy machinery until tomorrow (because of the sedation medicines used during the test).    FOLLOW UP: Our staff will call the number listed on your records the next business day following your procedure.  We will call around 7:15- 8:00 am to check on you and address any questions or concerns that you may have regarding the information given to you following your procedure. If we do not reach you, we will leave a message.     If any biopsies were taken you will be contacted by phone or by letter within the next 1-3 weeks.  Please call us  at [phone] if you have not heard about the biopsies in 3 weeks.    SIGNATURES/CONFIDENTIALITY: You and/or your care partner have signed paperwork which will be entered into your electronic medical record.  These signatures attest to the fact that that the information above on your After Visit Summary has been reviewed and is understood.  Full responsibility of the confidentiality of this discharge information lies with you and/or your care-partner.

## 2024-08-16 ENCOUNTER — Telehealth: Payer: Self-pay | Admitting: *Deleted

## 2024-08-16 NOTE — Telephone Encounter (Signed)
 No answer for follow up call. Left a message.

## 2024-08-20 LAB — SURGICAL PATHOLOGY

## 2024-08-27 ENCOUNTER — Ambulatory Visit: Payer: Self-pay | Admitting: Internal Medicine

## 2024-09-23 ENCOUNTER — Encounter: Payer: Self-pay | Admitting: Internal Medicine

## 2024-10-08 ENCOUNTER — Other Ambulatory Visit: Payer: Self-pay

## 2024-10-11 MED ORDER — ROSUVASTATIN CALCIUM 20 MG PO TABS: 20.0000 mg | ORAL_TABLET | Freq: Every day | ORAL | 1 refills | Status: AC

## 2024-11-13 ENCOUNTER — Ambulatory Visit: Admitting: Internal Medicine
